# Patient Record
Sex: Female | Born: 1990 | Race: Black or African American | Hispanic: No | Marital: Single | Smoking: Former smoker
Health system: Southern US, Community
[De-identification: ages and names within clinical notes are randomized; demographics above are authoritative.]

## PROBLEM LIST (undated history)

## (undated) ENCOUNTER — Emergency Department (HOSPITAL_COMMUNITY): Admission: EM | Discharge status: Absent without leave | Payer: Self-pay

---
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
## 2010-02-01 ENCOUNTER — Emergency Department (HOSPITAL_COMMUNITY): Admission: EM | Admit: 2010-02-01 | Discharge: 2010-02-01 | Payer: Self-pay | Admitting: Emergency Medicine

## 2010-02-02 ENCOUNTER — Emergency Department (HOSPITAL_COMMUNITY): Admission: EM | Admit: 2010-02-02 | Discharge: 2010-02-02 | Payer: Self-pay | Admitting: Emergency Medicine

## 2010-06-08 ENCOUNTER — Encounter: Payer: Self-pay | Admitting: Unknown Physician Specialty

## 2010-07-31 LAB — POCT I-STAT, CHEM 8
BUN: 7 mg/dL (ref 6–23)
Calcium, Ion: 1.12 mmol/L (ref 1.12–1.32)
Chloride: 104 mEq/L (ref 96–112)
Creatinine, Ser: 0.8 mg/dL (ref 0.4–1.2)
Glucose, Bld: 89 mg/dL (ref 70–99)
TCO2: 25 mmol/L (ref 0–100)

## 2010-11-11 ENCOUNTER — Emergency Department (HOSPITAL_COMMUNITY): Payer: Self-pay

## 2010-11-11 ENCOUNTER — Emergency Department (HOSPITAL_COMMUNITY)
Admission: EM | Admit: 2010-11-11 | Discharge: 2010-11-11 | Disposition: A | Discharge status: Home / Self Care | Payer: Self-pay | Attending: Emergency Medicine | Admitting: Emergency Medicine

## 2010-11-11 DIAGNOSIS — S93409A Sprain of unspecified ligament of unspecified ankle, initial encounter: Secondary | ICD-10-CM | POA: Insufficient documentation

## 2010-11-11 DIAGNOSIS — X500XXA Overexertion from strenuous movement or load, initial encounter: Secondary | ICD-10-CM | POA: Insufficient documentation

## 2010-11-11 DIAGNOSIS — Y92009 Unspecified place in unspecified non-institutional (private) residence as the place of occurrence of the external cause: Secondary | ICD-10-CM | POA: Insufficient documentation

## 2010-12-26 ENCOUNTER — Emergency Department (HOSPITAL_COMMUNITY)
Admission: EM | Admit: 2010-12-26 | Discharge: 2010-12-26 | Disposition: A | Discharge status: Home / Self Care | Payer: Self-pay | Attending: Emergency Medicine | Admitting: Emergency Medicine

## 2010-12-26 ENCOUNTER — Emergency Department (HOSPITAL_COMMUNITY): Payer: Self-pay

## 2010-12-26 DIAGNOSIS — R059 Cough, unspecified: Secondary | ICD-10-CM | POA: Insufficient documentation

## 2010-12-26 DIAGNOSIS — R0602 Shortness of breath: Secondary | ICD-10-CM | POA: Insufficient documentation

## 2010-12-26 DIAGNOSIS — R0789 Other chest pain: Secondary | ICD-10-CM | POA: Insufficient documentation

## 2010-12-26 DIAGNOSIS — R062 Wheezing: Secondary | ICD-10-CM | POA: Insufficient documentation

## 2010-12-26 DIAGNOSIS — R072 Precordial pain: Secondary | ICD-10-CM | POA: Insufficient documentation

## 2010-12-26 DIAGNOSIS — F17200 Nicotine dependence, unspecified, uncomplicated: Secondary | ICD-10-CM | POA: Insufficient documentation

## 2010-12-26 DIAGNOSIS — R05 Cough: Secondary | ICD-10-CM | POA: Insufficient documentation

## 2010-12-26 LAB — APTT: aPTT: 35 seconds (ref 24–37)

## 2010-12-26 LAB — URINALYSIS, ROUTINE W REFLEX MICROSCOPIC
Bilirubin Urine: NEGATIVE
Glucose, UA: NEGATIVE mg/dL
Hgb urine dipstick: NEGATIVE
Protein, ur: NEGATIVE mg/dL

## 2010-12-26 LAB — DIFFERENTIAL
Basophils Relative: 0 % (ref 0–1)
Lymphocytes Relative: 23 % (ref 12–46)
Lymphs Abs: 3.3 10*3/uL (ref 0.7–4.0)
Monocytes Absolute: 0.9 10*3/uL (ref 0.1–1.0)
Monocytes Relative: 6 % (ref 3–12)
Neutro Abs: 9.7 10*3/uL — ABNORMAL HIGH (ref 1.7–7.7)
Neutrophils Relative %: 69 % (ref 43–77)

## 2010-12-26 LAB — BASIC METABOLIC PANEL
BUN: 14 mg/dL (ref 6–23)
CO2: 25 mEq/L (ref 19–32)
Calcium: 9.8 mg/dL (ref 8.4–10.5)
GFR calc non Af Amer: >60 mL/min (ref >60)
Glucose, Bld: 87 mg/dL (ref 70–99)
Sodium: 138 mEq/L (ref 135–145)

## 2010-12-26 LAB — CBC
HCT: 40.2 % (ref 36.0–46.0)
Hemoglobin: 13.5 g/dL (ref 12.0–15.0)
MCH: 32.1 pg (ref 26.0–34.0)
MCHC: 33.6 g/dL (ref 30.0–36.0)
MCV: 95.5 fL (ref 78.0–100.0)
RBC: 4.21 MIL/uL (ref 3.87–5.11)

## 2010-12-26 LAB — URINE MICROSCOPIC-ADD ON

## 2010-12-26 LAB — PROTIME-INR: Prothrombin Time: 13.6 seconds (ref 11.6–15.2)

## 2010-12-26 LAB — MAGNESIUM: Magnesium: 2 mg/dL (ref 1.5–2.5)

## 2010-12-26 MED ORDER — IOHEXOL 300 MG/ML  SOLN
100.0000 mL | Freq: Once | INTRAMUSCULAR | Status: AC | PRN
  Administered 2010-12-26: 100 mL via INTRAVENOUS

## 2010-12-28 LAB — URINE CULTURE: Colony Count: 30000

## 2011-01-19 ENCOUNTER — Emergency Department (HOSPITAL_COMMUNITY)
Admission: EM | Admit: 2011-01-19 | Discharge: 2011-01-19 | Disposition: A | Discharge status: Home / Self Care | Payer: Self-pay | Attending: Emergency Medicine | Admitting: Emergency Medicine

## 2011-01-19 DIAGNOSIS — J45909 Unspecified asthma, uncomplicated: Secondary | ICD-10-CM | POA: Insufficient documentation

## 2011-01-19 DIAGNOSIS — T63461A Toxic effect of venom of wasps, accidental (unintentional), initial encounter: Secondary | ICD-10-CM | POA: Insufficient documentation

## 2011-01-19 DIAGNOSIS — T63481A Toxic effect of venom of other arthropod, accidental (unintentional), initial encounter: Secondary | ICD-10-CM | POA: Insufficient documentation

## 2011-01-19 DIAGNOSIS — F39 Unspecified mood [affective] disorder: Secondary | ICD-10-CM | POA: Insufficient documentation

## 2011-01-19 DIAGNOSIS — Z9109 Other allergy status, other than to drugs and biological substances: Secondary | ICD-10-CM | POA: Insufficient documentation

## 2011-01-19 DIAGNOSIS — T6391XA Toxic effect of contact with unspecified venomous animal, accidental (unintentional), initial encounter: Secondary | ICD-10-CM | POA: Insufficient documentation

## 2011-01-19 DIAGNOSIS — R21 Rash and other nonspecific skin eruption: Secondary | ICD-10-CM | POA: Insufficient documentation

## 2011-01-20 ENCOUNTER — Emergency Department (HOSPITAL_COMMUNITY)
Admission: EM | Admit: 2011-01-20 | Discharge: 2011-01-20 | Disposition: A | Discharge status: Home / Self Care | Payer: No Typology Code available for payment source | Attending: Emergency Medicine | Admitting: Emergency Medicine

## 2011-01-20 ENCOUNTER — Emergency Department (HOSPITAL_COMMUNITY): Payer: No Typology Code available for payment source

## 2011-01-20 DIAGNOSIS — T07XXXA Unspecified multiple injuries, initial encounter: Secondary | ICD-10-CM | POA: Insufficient documentation

## 2011-01-20 DIAGNOSIS — Y92410 Unspecified street and highway as the place of occurrence of the external cause: Secondary | ICD-10-CM | POA: Insufficient documentation

## 2011-01-20 DIAGNOSIS — S139XXA Sprain of joints and ligaments of unspecified parts of neck, initial encounter: Secondary | ICD-10-CM | POA: Insufficient documentation

## 2011-01-25 ENCOUNTER — Inpatient Hospital Stay (INDEPENDENT_AMBULATORY_CARE_PROVIDER_SITE_OTHER)
Admission: RE | Admit: 2011-01-25 | Discharge: 2011-01-25 | Disposition: A | Discharge status: Home / Self Care | Payer: Self-pay | Source: Ambulatory Visit | Attending: Family Medicine | Admitting: Family Medicine

## 2011-01-25 DIAGNOSIS — M799 Soft tissue disorder, unspecified: Secondary | ICD-10-CM

## 2011-01-25 DIAGNOSIS — S139XXA Sprain of joints and ligaments of unspecified parts of neck, initial encounter: Secondary | ICD-10-CM

## 2011-01-25 DIAGNOSIS — S40019A Contusion of unspecified shoulder, initial encounter: Secondary | ICD-10-CM

## 2011-06-22 ENCOUNTER — Emergency Department (HOSPITAL_COMMUNITY)
Admission: EM | Admit: 2011-06-22 | Discharge: 2011-06-23 | Disposition: A | Discharge status: Home / Self Care | Payer: Medicaid Other | Attending: Emergency Medicine | Admitting: Emergency Medicine

## 2011-06-22 ENCOUNTER — Encounter (HOSPITAL_COMMUNITY): Payer: Self-pay | Admitting: *Deleted

## 2011-06-22 ENCOUNTER — Emergency Department (HOSPITAL_COMMUNITY): Payer: Medicaid Other

## 2011-06-22 DIAGNOSIS — J45909 Unspecified asthma, uncomplicated: Secondary | ICD-10-CM | POA: Insufficient documentation

## 2011-06-22 DIAGNOSIS — S6390XA Sprain of unspecified part of unspecified wrist and hand, initial encounter: Secondary | ICD-10-CM | POA: Insufficient documentation

## 2011-06-22 DIAGNOSIS — M79609 Pain in unspecified limb: Secondary | ICD-10-CM | POA: Insufficient documentation

## 2011-06-22 DIAGNOSIS — F17200 Nicotine dependence, unspecified, uncomplicated: Secondary | ICD-10-CM | POA: Insufficient documentation

## 2011-06-22 DIAGNOSIS — Y990 Civilian activity done for income or pay: Secondary | ICD-10-CM | POA: Insufficient documentation

## 2011-06-22 DIAGNOSIS — S6391XA Sprain of unspecified part of right wrist and hand, initial encounter: Secondary | ICD-10-CM

## 2011-06-22 DIAGNOSIS — M7989 Other specified soft tissue disorders: Secondary | ICD-10-CM | POA: Insufficient documentation

## 2011-06-22 DIAGNOSIS — W241XXA Contact with transmission devices, not elsewhere classified, initial encounter: Secondary | ICD-10-CM | POA: Insufficient documentation

## 2011-06-22 NOTE — ED Notes (Signed)
The pt works for [employer] and  She caught her rt hand in a conveyor belt just pta tonight.  She has pain in  Her rt ring and little fingers.

## 2011-06-22 NOTE — ED Notes (Signed)
Pt states hand caught in machine at work

## 2011-06-23 MED ORDER — ACETAMINOPHEN 325 MG PO TABS
ORAL_TABLET | ORAL | Status: AC
  Filled 2011-06-23: qty 3

## 2011-06-23 MED ORDER — ACETAMINOPHEN 500 MG PO TABS
1000.0000 mg | ORAL_TABLET | ORAL | Status: AC
  Administered 2011-06-23: 1000 mg via ORAL
  Filled 2011-06-23: qty 2

## 2011-06-23 NOTE — ED Provider Notes (Signed)
History     CSN: [id number]  Arrival date & time 06/22/11  2121   First MD Initiated Contact with Patient 06/23/11 0019      Chief Complaint  Patient presents with  . Hand Injury    (Consider location/radiation/quality/duration/timing/severity/associated sxs/prior treatment) HPI Reports her right hand got caught in a conveyor belt tonight 6:20 PM while at work complains of pain at ulnar aspect of hand from fingers or wrist worse with movement treated with ibuprofen with partial relief no numbness. No associated symptoms. Pain is nonradiating not improved by anything . Moderate at [employer] Past Medical History  Diagnosis Date  . Asthma     History reviewed. No pertinent past surgical history.  History reviewed. No pertinent family history.  History  Substance Use Topics  . Smoking status: Current Everyday Smoker  . Smokeless tobacco: Not on file  . Alcohol Use: No    OB History    Grav Para Term Preterm Abortions TAB SAB Ect Mult Living                  Review of Systems  Constitutional: Negative.   Musculoskeletal:       Trauma right hand  Skin: Negative.   Neurological: Negative.   Hematological: Negative.   Psychiatric/Behavioral: Negative.     Allergies  Review of patient's allergies indicates no known allergies.  Home Medications  No current outpatient prescriptions on file.  BP 138/93  Pulse 91  Temp(Src) 97.9 F (36.6 C) (Oral)  Resp 16  SpO2 96%  Physical Exam  Constitutional: She appears well-nourished. No distress.  HENT:  Head: Normocephalic and atraumatic.  Eyes: EOM are normal.  Neck: Neck supple.  Cardiovascular: Normal rate.   Pulmonary/Chest: Effort normal.  Abdominal:       Obese  Musculoskeletal:       Right upper extremity hand mildly diffusely tender with minimal soft tissue swelling no ecchymosis. Limited range of motion motion of fingers 234 and 5 due to pain of fingers with good capillary refill sensation intact no subungual  hematoma/. Wrist nontender forearm nontender radial pulse 2+; all other extremities atraumatic neurovascular intact    ED Course  Procedures (including critical care time) Velcro splint applied by orthostatic tech, comfortable for patient Labs Reviewed - No data to display Dg Hand Complete Right  06/22/2011  *RADIOLOGY REPORT*  Clinical Data: Right hand pain.  RIGHT HAND - COMPLETE 3+ VIEW  Comparison: 01/20/2011 wrist radiograph  Findings: No acute fracture or dislocation.  No aggressive osseous lesions.  IMPRESSION: No acute osseous abnormality identified.  Original Report Authenticated By: Waneta Martins, M.D.     No diagnosis found.    MDM  Patient declines prescription for pain medicine stating that she will take Tylenol Plan followup with workers comp clinic tomorrow to determine work limitations Note this is not a roller-type crush injury   Diagnosis sprain right hand          Doug Sou, MD 06/23/11 0210

## 2011-06-23 NOTE — ED Notes (Signed)
[facility] contacted for R wrist lateral splint

## 2011-06-23 NOTE — ED Notes (Signed)
Pt still in dept for urine sample for workmans comp.

## 2011-06-23 NOTE — ED Notes (Addendum)
Pt reports she caught her  Right hand between covaery belt and roller table no broken skin or deformity noted but +1 edema to back of hand

## 2011-06-30 ENCOUNTER — Telehealth (HOSPITAL_COMMUNITY): Payer: Self-pay | Admitting: *Deleted

## 2011-06-30 NOTE — ED Notes (Signed)
[address] [employer] called to verify pt. was seen here 2/4. I told her we saw the pt. but then transferred her to the ED. Referred her to the flow manager @ [phone]. Vassie Moselle 06/30/2011

## 2012-08-14 ENCOUNTER — Encounter (HOSPITAL_COMMUNITY): Payer: Self-pay

## 2012-08-14 ENCOUNTER — Emergency Department (HOSPITAL_COMMUNITY)
Admission: EM | Admit: 2012-08-14 | Discharge: 2012-08-14 | Disposition: A | Discharge status: Home / Self Care | Payer: Self-pay | Attending: Emergency Medicine | Admitting: Emergency Medicine

## 2012-08-14 ENCOUNTER — Emergency Department (HOSPITAL_COMMUNITY): Payer: Self-pay

## 2012-08-14 DIAGNOSIS — R110 Nausea: Secondary | ICD-10-CM | POA: Insufficient documentation

## 2012-08-14 DIAGNOSIS — K219 Gastro-esophageal reflux disease without esophagitis: Secondary | ICD-10-CM | POA: Insufficient documentation

## 2012-08-14 DIAGNOSIS — R059 Cough, unspecified: Secondary | ICD-10-CM | POA: Insufficient documentation

## 2012-08-14 DIAGNOSIS — R300 Dysuria: Secondary | ICD-10-CM | POA: Insufficient documentation

## 2012-08-14 DIAGNOSIS — R05 Cough: Secondary | ICD-10-CM | POA: Insufficient documentation

## 2012-08-14 DIAGNOSIS — J45909 Unspecified asthma, uncomplicated: Secondary | ICD-10-CM | POA: Insufficient documentation

## 2012-08-14 DIAGNOSIS — R071 Chest pain on breathing: Secondary | ICD-10-CM | POA: Insufficient documentation

## 2012-08-14 DIAGNOSIS — Z87891 Personal history of nicotine dependence: Secondary | ICD-10-CM | POA: Insufficient documentation

## 2012-08-14 LAB — CBC WITH DIFFERENTIAL/PLATELET
Basophils Absolute: 0 10*3/uL (ref 0.0–0.1)
Basophils Relative: 0 % (ref 0–1)
Eosinophils Absolute: 0.1 10*3/uL (ref 0.0–0.7)
Eosinophils Relative: 2 % (ref 0–5)
MCH: 32.7 pg (ref 26.0–34.0)
MCV: 96.7 fL (ref 78.0–100.0)
Neutrophils Relative %: 60 % (ref 43–77)
Platelets: 263 10*3/uL (ref 150–400)
RDW: 12.2 % (ref 11.5–15.5)

## 2012-08-14 LAB — BASIC METABOLIC PANEL
Calcium: 9.2 mg/dL (ref 8.4–10.5)
GFR calc Af Amer: >90 mL/min (ref >90)
GFR calc non Af Amer: >90 mL/min (ref >90)
Sodium: 137 mEq/L (ref 135–145)

## 2012-08-14 LAB — LIPASE, BLOOD: Lipase: 23 U/L (ref 11–59)

## 2012-08-14 MED ORDER — GI COCKTAIL ~~LOC~~
30.0000 mL | Freq: Once | ORAL | Status: AC
  Administered 2012-08-14: 30 mL via ORAL
  Filled 2012-08-14: qty 30

## 2012-08-14 MED ORDER — OMEPRAZOLE 20 MG PO CPDR: 20.0000 mg | DELAYED_RELEASE_CAPSULE | Freq: Every day | ORAL | Status: DC

## 2012-08-14 NOTE — ED Notes (Signed)
C/o LUQ pain x 6 days.  C/o nausea but no v/d.  Consistent ache w/occasional shooting pain up into LT arm, up into throat and into head.

## 2012-08-14 NOTE — ED Provider Notes (Signed)
History     CSN: [id number]  Arrival date & time 08/14/12  1250   First MD Initiated Contact with Patient 08/14/12 1340      Chief Complaint  Patient presents with  . LUQ pain     (Consider location/radiation/quality/duration/timing/severity/associated sxs/prior treatment) HPI Comments: This is a 22 year old female, past medical history remarkable for asthma, who presents emergency department with chief complaint of upper left abdominal pain. Patient states that the pain is worsened with eating, drinking, or laying on the left side, and deep breathing. She also endorses a nonproductive cough. She denies fever, chills, and shortness of breath. She endorses nausea, but no vomiting or diarrhea. She endorses some dysuria. She states that her pain is intermittent and moderate in severity. She states that it was sometimes radiate to her left arm, throat, and head.  The history is provided by the patient. No language interpreter was used.    Past Medical History  Diagnosis Date  . Asthma     No past surgical history on file.  No family history on file.  History  Substance Use Topics  . Smoking status: Former Games developer  . Smokeless tobacco: Not on file  . Alcohol Use: Yes     Comment: occasionally    OB History   Grav Para Term Preterm Abortions TAB SAB Ect Mult Living                  Review of Systems  All other systems reviewed and are negative.    Allergies  Review of patient's allergies indicates no known allergies.  Home Medications   Current Outpatient Rx  Name  Route  Sig  Dispense  Refill  . ibuprofen (ADVIL,MOTRIN) 200 MG tablet   Oral   Take 200 mg by mouth every 6 (six) hours as needed for pain.           BP 130/80  Pulse 88  Temp(Src) 98.3 F (36.8 C) (Oral)  Resp 16  Ht 5\' 2"  (1.575 m)  Wt 187 lb (84.823 kg)  BMI 34.19 kg/m2  SpO2 100%  Physical Exam  Nursing note and vitals reviewed. Constitutional: She is oriented to person, place, and  time. She appears well-developed and well-nourished.  HENT:  Head: Normocephalic and atraumatic.  Eyes: Conjunctivae and EOM are normal. Pupils are equal, round, and reactive to light.  Neck: Normal range of motion. Neck supple.  Cardiovascular: Normal rate and regular rhythm.  Exam reveals no gallop and no friction rub.   No murmur heard. Pulmonary/Chest: Effort normal and breath sounds normal. No respiratory distress. She has no wheezes. She has no rales. She exhibits no tenderness.  Left lower ribs tender to palpation  Abdominal: Soft. Bowel sounds are normal. She exhibits no distension and no mass. There is no tenderness. There is no rebound and no guarding.  No focal abdominal tenderness, no right lower quadrant tenderness or pain at McBurney's point, no right upper quadrant tenderness or Murphy's sign, no left lower quadrant tenderness or fluid wave, or signs of peritonitis  Musculoskeletal: Normal range of motion. She exhibits no edema and no tenderness.  Neurological: She is alert and oriented to person, place, and time.  Skin: Skin is warm and dry.  Psychiatric: She has a normal mood and affect. Her behavior is normal. Judgment and thought content normal.    ED Course  Procedures (including critical care time)  Results for orders placed during the hospital encounter of 08/14/12  CBC WITH DIFFERENTIAL  Result Value Range   WBC 7.1  4.0 - 10.5 K/uL   RBC 3.91  3.87 - 5.11 MIL/uL   Hemoglobin 12.8  12.0 - 15.0 g/dL   HCT 04.5  40.9 - 81.1 %   MCV 96.7  78.0 - 100.0 fL   MCH 32.7  26.0 - 34.0 pg   MCHC 33.9  30.0 - 36.0 g/dL   RDW 91.4  78.2 - 95.6 %   Platelets 263  150 - 400 K/uL   Neutrophils Relative 60  43 - 77 %   Neutro Abs 4.3  1.7 - 7.7 K/uL   Lymphocytes Relative 29  12 - 46 %   Lymphs Abs 2.0  0.7 - 4.0 K/uL   Monocytes Relative 9  3 - 12 %   Monocytes Absolute 0.6  0.1 - 1.0 K/uL   Eosinophils Relative 2  0 - 5 %   Eosinophils Absolute 0.1  0.0 - 0.7 K/uL    Basophils Relative 0  0 - 1 %   Basophils Absolute 0.0  0.0 - 0.1 K/uL  BASIC METABOLIC PANEL      Result Value Range   Sodium 137  135 - 145 mEq/L   Potassium 4.0  3.5 - 5.1 mEq/L   Chloride 102  96 - 112 mEq/L   CO2 25  19 - 32 mEq/L   Glucose, Bld 92  70 - 99 mg/dL   BUN 13  6 - 23 mg/dL   Creatinine, Ser 2.13  0.50 - 1.10 mg/dL   Calcium 9.2  8.4 - 08.6 mg/dL   GFR calc non Af Amer >90  >90 mL/min   GFR calc Af Amer >90  >90 mL/min  LIPASE, BLOOD      Result Value Range   Lipase 23  11 - 59 U/L   Dg Ribs Unilateral W/chest Left  08/14/2012  *RADIOLOGY REPORT*  Clinical Data: Cough.  Anterior rib pain.  LEFT RIBS AND CHEST - 3+ VIEW  Comparison: CT chest 12/26/2010.  Findings: The heart size is normal.  The lungs are clear.  Dedicated imaging of the left ribs demonstrates no evidence for acute or healing fracture.  There is no pneumothorax.  IMPRESSION:  1.  No acute cardiopulmonary disease. 2.  Normal radiographs of the left ribs.   Original Report Authenticated By: Marin Roberts, M.D.       1. GERD (gastroesophageal reflux disease)       MDM  22 year old female with cough, nausea, and left upper quadrant tenderness.    Patient feels improved after GI cocktail.  Other labs and imaging today are reassuring.  Will treat with Prilosec.  PCP follow-up.  Discussed the patient with Dr. Lynelle Doctor, who agrees with the plan.        Roxy Horseman, PA-C 08/14/12 1953

## 2012-08-15 NOTE — ED Provider Notes (Signed)
Medical screening examination/treatment/procedure(s) were performed by non-physician practitioner and as supervising physician I was immediately available for consultation/collaboration. Stamatia Masri, MD, FACEP   Marlayna Bannister L Olympia Adelsberger, MD 08/15/12 0658 

## 2012-10-10 ENCOUNTER — Emergency Department (HOSPITAL_COMMUNITY)
Admission: EM | Admit: 2012-10-10 | Discharge: 2012-10-11 | Disposition: A | Discharge status: Home / Self Care | Payer: Self-pay | Attending: Emergency Medicine | Admitting: Emergency Medicine

## 2012-10-10 ENCOUNTER — Encounter (HOSPITAL_COMMUNITY): Payer: Self-pay | Admitting: *Deleted

## 2012-10-10 DIAGNOSIS — R630 Anorexia: Secondary | ICD-10-CM | POA: Insufficient documentation

## 2012-10-10 DIAGNOSIS — J45901 Unspecified asthma with (acute) exacerbation: Secondary | ICD-10-CM | POA: Insufficient documentation

## 2012-10-10 DIAGNOSIS — K089 Disorder of teeth and supporting structures, unspecified: Secondary | ICD-10-CM | POA: Insufficient documentation

## 2012-10-10 DIAGNOSIS — Z3202 Encounter for pregnancy test, result negative: Secondary | ICD-10-CM | POA: Insufficient documentation

## 2012-10-10 DIAGNOSIS — K0889 Other specified disorders of teeth and supporting structures: Secondary | ICD-10-CM

## 2012-10-10 DIAGNOSIS — Z87891 Personal history of nicotine dependence: Secondary | ICD-10-CM | POA: Insufficient documentation

## 2012-10-10 DIAGNOSIS — R55 Syncope and collapse: Secondary | ICD-10-CM | POA: Insufficient documentation

## 2012-10-10 DIAGNOSIS — Z79899 Other long term (current) drug therapy: Secondary | ICD-10-CM | POA: Insufficient documentation

## 2012-10-10 DIAGNOSIS — K029 Dental caries, unspecified: Secondary | ICD-10-CM | POA: Insufficient documentation

## 2012-10-10 LAB — CBC WITH DIFFERENTIAL/PLATELET
Basophils Absolute: 0.1 10*3/uL (ref 0.0–0.1)
Basophils Relative: 0 % (ref 0–1)
HCT: 36.5 % (ref 36.0–46.0)
MCHC: 33.4 g/dL (ref 30.0–36.0)
Monocytes Absolute: 0.8 10*3/uL (ref 0.1–1.0)
Neutro Abs: 9 10*3/uL — ABNORMAL HIGH (ref 1.7–7.7)
Platelets: 247 10*3/uL (ref 150–400)
RDW: 12.8 % (ref 11.5–15.5)

## 2012-10-10 LAB — POCT I-STAT, CHEM 8
Calcium, Ion: 1.14 mmol/L (ref 1.12–1.23)
Chloride: 108 mEq/L (ref 96–112)
HCT: 38 % (ref 36.0–46.0)
TCO2: 25 mmol/L (ref 0–100)

## 2012-10-10 LAB — POCT PREGNANCY, URINE: Preg Test, Ur: NEGATIVE

## 2012-10-10 MED ORDER — ALBUTEROL SULFATE HFA 108 (90 BASE) MCG/ACT IN AERS
2.0000 | INHALATION_SPRAY | RESPIRATORY_TRACT | Status: DC | PRN
  Administered 2012-10-10: 2 via RESPIRATORY_TRACT
  Filled 2012-10-10: qty 6.7

## 2012-10-10 NOTE — ED Provider Notes (Signed)
History     CSN: [id number]  Arrival date & time 10/10/12  2057   First MD Initiated Contact with Patient 10/10/12 2146      Chief Complaint  Patient presents with  . Loss of Consciousness   HPI  History provided by the patient. Patient is a healthy 22 year old female past history of asthma who presents with complaints of worsened bilateral lower dental pain and syncopal episodes. Patient states she has had increasing pain to her lower molar teeth the past few days. Pain has caused her to have decreased appetite and fluid intake. She denies having any swelling of the mouth. Denies any fever, chills or sweats. Today while at work she was feeling lightheaded and while having increased sharp pains were teeth reports having a brief syncopal episode. She states she was caught by her coworkers and sat into a chair. She was still feeling lightheaded and when she stood up.  Pt reports that she had additional episodes of syncope earlier in the week as well. She denied having any chest pain or shortness of breath. No heart palpitations. There was no urinary or fecal incontinence. No post ictal state. She denies any injuries or pain. Patient has not traveled anywhere recently. Denies cough or hemoptysis. She has not used birth control and estrogen.     Past Medical History  Diagnosis Date  . Asthma     No past surgical history on file.  No family history on file.  History  Substance Use Topics  . Smoking status: Former Games developer  . Smokeless tobacco: Not on file  . Alcohol Use: Yes     Comment: occasionally    OB History   Grav Para Term Preterm Abortions TAB SAB Ect Mult Living                  Review of Systems  Constitutional: Negative for fever, chills and diaphoresis.  HENT: Positive for dental problem. Negative for drooling, mouth sores and trouble swallowing.   Respiratory: Positive for wheezing. Negative for shortness of breath.   Cardiovascular: Negative for chest pain and  palpitations.  Gastrointestinal: Negative for nausea and vomiting.  Neurological: Positive for syncope.  All other systems reviewed and are negative.    Allergies  Oxycodone  Home Medications   Current Outpatient Rx  Name  Route  Sig  Dispense  Refill  . albuterol (PROVENTIL HFA;VENTOLIN HFA) 108 (90 BASE) MCG/ACT inhaler   Inhalation   Inhale 2 puffs into the lungs every 6 (six) hours as needed for wheezing or shortness of breath.         Marland Kitchen ibuprofen (ADVIL,MOTRIN) 200 MG tablet   Oral   Take 800 mg by mouth every 6 (six) hours as needed for pain.          Marland Kitchen omeprazole (PRILOSEC) 20 MG capsule   Oral   Take 1 capsule (20 mg total) by mouth daily.   30 capsule   0     BP 131/81  Pulse 99  Temp(Src) 98.3 F (36.8 C) (Oral)  Resp 18  Ht 5\' 3"  (1.6 m)  Wt 198 lb (89.812 kg)  BMI 35.08 kg/m2  SpO2 100%  Physical Exam  Nursing note and vitals reviewed. Constitutional: She is oriented to person, place, and time. She appears well-developed and well-nourished. No distress.  HENT:  Head: Normocephalic.  Mouth/Throat: Oropharynx is clear and moist and mucous membranes are normal.    There are fractures indicate to the bilateral lower first  molar teeth. Tenderness to percussion over both teeth. Surrounding gums appear normal without significant edema and no signs of drainable abscess. No swelling under the tongue.  Neck: Normal range of motion. Neck supple.  Cardiovascular: Normal rate and regular rhythm.   No murmur heard. Pulmonary/Chest: Effort normal. No stridor. No respiratory distress. She has wheezes. She has no rales. She exhibits no tenderness.  Very slight expiratory wheeze.  Abdominal: Soft. There is no tenderness. There is no rebound and no guarding.  Musculoskeletal: Normal range of motion. She exhibits no edema and no tenderness.  No clinical signs concerning for DVT  Lymphadenopathy:    She has no cervical adenopathy.  Neurological: She is alert and  oriented to person, place, and time.  Skin: Skin is warm and dry. No rash noted.  Psychiatric: She has a normal mood and affect. Her behavior is normal.    ED Course  Procedures   Results for orders placed during the hospital encounter of 10/10/12  URINALYSIS, ROUTINE W REFLEX MICROSCOPIC      Result Value Range   Color, Urine YELLOW  YELLOW   APPearance CLOUDY (*) CLEAR   Specific Gravity, Urine 1.030  1.005 - 1.030   pH 5.5  5.0 - 8.0   Glucose, UA NEGATIVE  NEGATIVE mg/dL   Hgb urine dipstick NEGATIVE  NEGATIVE   Bilirubin Urine NEGATIVE  NEGATIVE   Ketones, ur NEGATIVE  NEGATIVE mg/dL   Protein, ur NEGATIVE  NEGATIVE mg/dL   Urobilinogen, UA 0.2  0.0 - 1.0 mg/dL   Nitrite NEGATIVE  NEGATIVE   Leukocytes, UA SMALL (*) NEGATIVE  CBC WITH DIFFERENTIAL      Result Value Range   WBC 14.1 (*) 4.0 - 10.5 K/uL   RBC 3.76 (*) 3.87 - 5.11 MIL/uL   Hemoglobin 12.2  12.0 - 15.0 g/dL   HCT 16.1  09.6 - 04.5 %   MCV 97.1  78.0 - 100.0 fL   MCH 32.4  26.0 - 34.0 pg   MCHC 33.4  30.0 - 36.0 g/dL   RDW 40.9  81.1 - 91.4 %   Platelets 247  150 - 400 K/uL   Neutrophils Relative % 64  43 - 77 %   Neutro Abs 9.0 (*) 1.7 - 7.7 K/uL   Lymphocytes Relative 26  12 - 46 %   Lymphs Abs 3.7  0.7 - 4.0 K/uL   Monocytes Relative 6  3 - 12 %   Monocytes Absolute 0.8  0.1 - 1.0 K/uL   Eosinophils Relative 4  0 - 5 %   Eosinophils Absolute 0.5  0.0 - 0.7 K/uL   Basophils Relative 0  0 - 1 %   Basophils Absolute 0.1  0.0 - 0.1 K/uL  URINE MICROSCOPIC-ADD ON      Result Value Range   Squamous Epithelial / LPF FEW (*) RARE   WBC, UA 3-6  <3 WBC/hpf   Bacteria, UA RARE  RARE  POCT I-STAT, CHEM 8      Result Value Range   Sodium 142  135 - 145 mEq/L   Potassium 3.8  3.5 - 5.1 mEq/L   Chloride 108  96 - 112 mEq/L   BUN 14  6 - 23 mg/dL   Creatinine, Ser 7.82  0.50 - 1.10 mg/dL   Glucose, Bld 84  70 - 99 mg/dL   Calcium, Ion 9.56  2.13 - 1.23 mmol/L   TCO2 25  0 - 100 mmol/L   Hemoglobin 12.9  12.0 - 15.0 g/dL   HCT 30.8  65.7 - 84.6 %  POCT PREGNANCY, URINE      Result Value Range   Preg Test, Ur NEGATIVE  NEGATIVE        1. Pain, dental   2. Dental caries   3. Syncope       MDM  10:10PM patient seen and evaluated. Patient appears well in no acute distress. Patient is PERC negative. Normal heart rate with no murmur. Normal respirations and O2 sats. Slight wheeze with history of asthma. Albuterol inhaler ordered.  Pt is PERC negative.  Labs unremarkable. Patient continues to appear well. This discussed plan for her to followup with the dentist for dental pains. We'll give prescription for antibiotics for the infection. Patient also instructed to follow up with the primary care provider. We discussed that she may need further evaluation and possible Holter monitor. She is advised to return if symptoms continue or worsen.        Date: 10/10/2012  Rate: 85  Rhythm: normal sinus rhythm  QRS Axis: normal  Intervals: normal  ST/T Wave abnormalities: normal  Conduction Disutrbances:none  Narrative Interpretation:   Old EKG Reviewed: No change from 12/26/2010          Angus Seller, PA-C 10/11/12 0345

## 2012-10-10 NOTE — ED Notes (Signed)
Pt noted no problems with orthostatic vital signs

## 2012-10-10 NOTE — ED Notes (Signed)
Pt states that she has had 3 syncopal episodes in the last week ; pt states that the first time "I went totally out and I didn't know it"; pt states that the other 2 episodes she was awake but everything was black and she couldn't see and felt like she couldn't breathe; pt states that she is also having dental pain and has been having trouble with her asthma

## 2012-10-11 LAB — URINALYSIS, ROUTINE W REFLEX MICROSCOPIC
Glucose, UA: NEGATIVE mg/dL
Ketones, ur: NEGATIVE mg/dL
Nitrite: NEGATIVE
Protein, ur: NEGATIVE mg/dL
Urobilinogen, UA: 0.2 mg/dL (ref 0.0–1.0)

## 2012-10-11 LAB — URINE MICROSCOPIC-ADD ON

## 2012-10-11 MED ORDER — IBUPROFEN 800 MG PO TABS: 800.0000 mg | ORAL_TABLET | Freq: Three times a day (TID) | ORAL | Status: DC | PRN

## 2012-10-11 MED ORDER — PENICILLIN V POTASSIUM 500 MG PO TABS: 500.0000 mg | ORAL_TABLET | Freq: Four times a day (QID) | ORAL | Status: AC

## 2012-10-11 NOTE — ED Notes (Signed)
Discharge instructions reviewed w/ pt., verbalizes understanding. Two prescriptions provided at discharge, importance of taking all of antibiotics reviewed.

## 2012-10-12 NOTE — ED Provider Notes (Signed)
Medical screening examination/treatment/procedure(s) were performed by non-physician practitioner and as supervising physician I was immediately available for consultation/collaboration.  Mayah Urquidi T Vir Whetstine, MD 10/12/12 2332 

## 2013-02-15 ENCOUNTER — Ambulatory Visit: Payer: Self-pay | Admitting: Cardiology

## 2013-04-05 ENCOUNTER — Ambulatory Visit: Payer: Self-pay

## 2013-05-04 ENCOUNTER — Emergency Department (HOSPITAL_COMMUNITY): Payer: No Typology Code available for payment source

## 2013-05-04 ENCOUNTER — Emergency Department (HOSPITAL_COMMUNITY)
Admission: EM | Admit: 2013-05-04 | Discharge: 2013-05-04 | Disposition: A | Discharge status: Home / Self Care | Payer: No Typology Code available for payment source | Attending: Emergency Medicine | Admitting: Emergency Medicine

## 2013-05-04 ENCOUNTER — Encounter (HOSPITAL_COMMUNITY): Payer: Self-pay | Admitting: Emergency Medicine

## 2013-05-04 DIAGNOSIS — R259 Unspecified abnormal involuntary movements: Secondary | ICD-10-CM | POA: Insufficient documentation

## 2013-05-04 DIAGNOSIS — S46909A Unspecified injury of unspecified muscle, fascia and tendon at shoulder and upper arm level, unspecified arm, initial encounter: Secondary | ICD-10-CM | POA: Insufficient documentation

## 2013-05-04 DIAGNOSIS — S0993XA Unspecified injury of face, initial encounter: Secondary | ICD-10-CM | POA: Insufficient documentation

## 2013-05-04 DIAGNOSIS — M549 Dorsalgia, unspecified: Secondary | ICD-10-CM

## 2013-05-04 DIAGNOSIS — Z87891 Personal history of nicotine dependence: Secondary | ICD-10-CM | POA: Insufficient documentation

## 2013-05-04 DIAGNOSIS — S8990XA Unspecified injury of unspecified lower leg, initial encounter: Secondary | ICD-10-CM | POA: Insufficient documentation

## 2013-05-04 DIAGNOSIS — S79919A Unspecified injury of unspecified hip, initial encounter: Secondary | ICD-10-CM | POA: Insufficient documentation

## 2013-05-04 DIAGNOSIS — M542 Cervicalgia: Secondary | ICD-10-CM

## 2013-05-04 DIAGNOSIS — J45909 Unspecified asthma, uncomplicated: Secondary | ICD-10-CM | POA: Insufficient documentation

## 2013-05-04 DIAGNOSIS — IMO0002 Reserved for concepts with insufficient information to code with codable children: Secondary | ICD-10-CM | POA: Insufficient documentation

## 2013-05-04 DIAGNOSIS — Y9389 Activity, other specified: Secondary | ICD-10-CM | POA: Insufficient documentation

## 2013-05-04 DIAGNOSIS — S4980XA Other specified injuries of shoulder and upper arm, unspecified arm, initial encounter: Secondary | ICD-10-CM | POA: Insufficient documentation

## 2013-05-04 DIAGNOSIS — Y92410 Unspecified street and highway as the place of occurrence of the external cause: Secondary | ICD-10-CM | POA: Insufficient documentation

## 2013-05-04 DIAGNOSIS — Z79899 Other long term (current) drug therapy: Secondary | ICD-10-CM | POA: Insufficient documentation

## 2013-05-04 DIAGNOSIS — S298XXA Other specified injuries of thorax, initial encounter: Secondary | ICD-10-CM | POA: Insufficient documentation

## 2013-05-04 MED ORDER — CYCLOBENZAPRINE HCL 10 MG PO TABS
10.0000 mg | ORAL_TABLET | Freq: Once | ORAL | Status: AC
  Administered 2013-05-04: 10 mg via ORAL
  Filled 2013-05-04: qty 1

## 2013-05-04 MED ORDER — CYCLOBENZAPRINE HCL 10 MG PO TABS: 10.0000 mg | ORAL_TABLET | Freq: Three times a day (TID) | ORAL | Status: DC | PRN

## 2013-05-04 MED ORDER — IBUPROFEN 800 MG PO TABS: 800.0000 mg | ORAL_TABLET | Freq: Three times a day (TID) | ORAL | Status: DC | PRN
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
## 2013-05-04 NOTE — ED Provider Notes (Signed)
Medical screening examination/treatment/procedure(s) were performed by non-physician practitioner and as supervising physician I was immediately available for consultation/collaboration.  [name], MD 05/04/13 [phone]
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
## 2013-12-04 ENCOUNTER — Ambulatory Visit: Payer: No Typology Code available for payment source | Attending: Internal Medicine | Admitting: Internal Medicine

## 2013-12-04 ENCOUNTER — Encounter: Payer: Self-pay | Admitting: Internal Medicine

## 2013-12-04 VITALS — BP 111/75 | HR 89 | Temp 98.0°F | Resp 22 | Ht 63.0 in | Wt 213.0 lb

## 2013-12-04 DIAGNOSIS — Z79899 Other long term (current) drug therapy: Secondary | ICD-10-CM | POA: Insufficient documentation

## 2013-12-04 DIAGNOSIS — K089 Disorder of teeth and supporting structures, unspecified: Secondary | ICD-10-CM | POA: Insufficient documentation

## 2013-12-04 DIAGNOSIS — Z7189 Other specified counseling: Secondary | ICD-10-CM

## 2013-12-04 DIAGNOSIS — Z87891 Personal history of nicotine dependence: Secondary | ICD-10-CM | POA: Insufficient documentation

## 2013-12-04 DIAGNOSIS — K0889 Other specified disorders of teeth and supporting structures: Secondary | ICD-10-CM

## 2013-12-04 DIAGNOSIS — Z7689 Persons encountering health services in other specified circumstances: Secondary | ICD-10-CM

## 2013-12-04 MED ORDER — IBUPROFEN 800 MG PO TABS
800.0000 mg | ORAL_TABLET | Freq: Three times a day (TID) | ORAL | Status: DC | PRN
Start: 2013-12-04 — End: 2014-03-26

## 2013-12-04 MED ORDER — AMOXICILLIN 500 MG PO CAPS: 500.0000 mg | ORAL_CAPSULE | Freq: Three times a day (TID) | ORAL | Status: DC

## 2013-12-04 NOTE — Patient Instructions (Signed)

## 2013-12-04 NOTE — Progress Notes (Signed)
Patient presents to establish care Requesting dental referral for 2 year history of teeth pain; rates 7/10 at present Referred to CSW for PHQ-9 score of 19

## 2013-12-04 NOTE — Progress Notes (Signed)
Patient ID: Paula Wiggins, female   DOB: September 12, 1990, 23 y.o.   MRN: [id number]  YQM:[id number]  [id number]:[id number]  DOB - 1991-01-31  CC:  Chief Complaint  Patient presents with  . Establish Care  . Dental Pain       HPI: Paula Wiggins is a 23 y.o. female here today to establish medical care.  Patient reports that she comes for a physical and for evaluation of dental pain.  Patient reports pain on both sides of her mouth in the back for the past two years.  Patient states that she has not been to a dentist and has only been taking ibuprofen for pain.  Patient denies all other complaints today.  Allergies  Allergen Reactions  . Oxycodone Swelling    Shortness of breath   Past Medical History  Diagnosis Date  . Asthma    Current Outpatient Prescriptions on File Prior to Visit  Medication Sig Dispense Refill  . albuterol (PROVENTIL HFA;VENTOLIN HFA) 108 (90 BASE) MCG/ACT inhaler Inhale 2 puffs into the lungs every 6 (six) hours as needed for wheezing or shortness of breath.      Marland Kitchen ibuprofen (ADVIL,MOTRIN) 200 MG tablet Take 400 mg by mouth every 6 (six) hours as needed (toothache).       . cyclobenzaprine (FLEXERIL) 10 MG tablet Take 1 tablet (10 mg total) by mouth 3 (three) times daily as needed for muscle spasms.  12 tablet  0  . ibuprofen (ADVIL,MOTRIN) 800 MG tablet Take 1 tablet (800 mg total) by mouth every 8 (eight) hours as needed.  21 tablet  0   No current facility-administered medications on file prior to visit.   Family History  Problem Relation Age of Onset  . Hypertension Mother   . Seizures Father   . Hypertension Father    History   Social History  . Marital Status: Single    Spouse Name: N/A    Number of Children: N/A  . Years of Education: N/A   Occupational History  . Not on file.   Social History Main Topics  . Smoking status: Former Games developer  . Smokeless tobacco: Not on file  . Alcohol Use: Yes     Comment: occasionally  . Drug Use: No  . Sexual  Activity: Not Currently     Comment: "with a woman"   Other Topics Concern  . Not on file   Social History Narrative  . No narrative on file   Review of Systems  Constitutional: Negative.   HENT: Positive for ear pain (ocassionaly).   Eyes: Negative.   Respiratory: Negative.   Cardiovascular: Positive for chest pain (occasionaly). Negative for palpitations, orthopnea, claudication, leg swelling and PND.  Gastrointestinal: Negative.   Genitourinary: Negative.   Musculoskeletal: Negative.   Neurological: Positive for tingling (hands ) and headaches. Negative for dizziness.  Endo/Heme/Allergies: Positive for polydipsia. Negative for environmental allergies. Does not bruise/bleed easily.  Psychiatric/Behavioral: Negative.       Objective:   Filed Vitals:   12/04/13 1138  BP: 111/75  Pulse: 89  Temp: 98 F (36.7 C)  Resp: 22    Physical Exam: Constitutional: Patient appears well-developed and well-nourished. No distress. HENT: Normocephalic, atraumatic, External right and left ear normal. Oropharynx is clear and moist. Poor dentition, swollen and red gums Eyes: Conjunctivae and EOM are normal. PERRLA, no scleral icterus. Neck: Normal ROM. Neck supple. No JVD. No tracheal deviation. No thyromegaly. CVS: RRR, S1/S2 +, no murmurs, no gallops, no carotid bruit.  Pulmonary: Effort  and breath sounds normal, no stridor, rhonchi, wheezes, rales.  Abdominal: Soft. BS +, no distension, tenderness, rebound or guarding.  Musculoskeletal: Normal range of motion. No edema and no tenderness.  Lymphadenopathy: No lymphadenopathy noted, cervical, inguinal or axillary Neuro: Alert. Normal reflexes, muscle tone coordination. No cranial nerve deficit. Skin: Skin is warm and dry. No rash noted. Not diaphoretic. No erythema. No pallor. Psychiatric: Normal mood and affect. Behavior, judgment, thought content normal.  Lab Results  Component Value Date   WBC 14.1* 10/10/2012   HGB 12.9  10/10/2012   HCT 38.0 10/10/2012   MCV 97.1 10/10/2012   PLT 247 10/10/2012   Lab Results  Component Value Date   CREATININE 0.90 10/10/2012   BUN 14 10/10/2012   NA 142 10/10/2012   K 3.8 10/10/2012   CL 108 10/10/2012   CO2 25 08/14/2012    No results found for this basename: HGBA1C   Lipid Panel  No results found for this basename: chol, trig, hdl, cholhdl, vldl, ldlcalc       Assessment and plan:   Paula Wiggins was seen today for establish care and dental pain.  Diagnoses and associated orders for this visit:  Pain, dental - amoxicillin (AMOXIL) 500 MG capsule; Take 1 capsule (500 mg total) by mouth 3 (three) times daily. - ibuprofen (ADVIL,MOTRIN) 800 MG tablet; Take 1 tablet (800 mg total) by mouth every 8 (eight) hours as needed.  Encounter to establish care - TSH; Future - COMPLETE METABOLIC PANEL WITH GFR; Future - CBC; Future - Hemoglobin A1C; Future - Vit D  25 hydroxy (rtn osteoporosis monitoring); Future - Lipid panel; Future   Return in about 1 week (around 12/11/2013) for Lab Visit.    Holland CommonsKECK, Liviana Mills, NP-C [facility] [phone] 12/05/2013, 7:24 PM

## 2013-12-11 ENCOUNTER — Other Ambulatory Visit: Payer: Self-pay

## 2014-02-02 ENCOUNTER — Ambulatory Visit: Payer: No Typology Code available for payment source | Attending: Family Medicine

## 2014-02-02 DIAGNOSIS — Z7689 Persons encountering health services in other specified circumstances: Secondary | ICD-10-CM

## 2014-02-02 LAB — CBC
HCT: 36.4 % (ref 36.0–46.0)
HEMOGLOBIN: 12.7 g/dL (ref 12.0–15.0)
MCH: 32.3 pg (ref 26.0–34.0)
MCHC: 34.9 g/dL (ref 30.0–36.0)
MCV: 92.6 fL (ref 78.0–100.0)
Platelets: 310 10*3/uL (ref 150–400)
RBC: 3.93 MIL/uL (ref 3.87–5.11)
RDW: 12.7 % (ref 11.5–15.5)
WBC: 10.6 10*3/uL — ABNORMAL HIGH (ref 4.0–10.5)

## 2014-02-02 LAB — LIPID PANEL
CHOL/HDL RATIO: 3.5 ratio
Cholesterol: 156 mg/dL (ref 0–200)
HDL: 44 mg/dL (ref >39)
LDL Cholesterol: 91 mg/dL (ref 0–99)
Triglycerides: 106 mg/dL (ref <150)
VLDL: 21 mg/dL (ref 0–40)

## 2014-02-02 LAB — HEMOGLOBIN A1C
Hgb A1c MFr Bld: 5.7 % — ABNORMAL HIGH (ref <5.7)
Mean Plasma Glucose: 117 mg/dL — ABNORMAL HIGH (ref <117)

## 2014-02-02 LAB — COMPLETE METABOLIC PANEL WITH GFR
ALT: 8 U/L (ref 0–35)
AST: 14 U/L (ref 0–37)
Albumin: 4.2 g/dL (ref 3.5–5.2)
Alkaline Phosphatase: 73 U/L (ref 39–117)
BILIRUBIN TOTAL: 0.3 mg/dL (ref 0.2–1.2)
BUN: 13 mg/dL (ref 6–23)
CO2: 24 mEq/L (ref 19–32)
Calcium: 9.4 mg/dL (ref 8.4–10.5)
Chloride: 101 mEq/L (ref 96–112)
Creat: 0.76 mg/dL (ref 0.50–1.10)
GFR, Est African American: >89 mL/min
GFR, Est Non African American: >89 mL/min
Glucose, Bld: 92 mg/dL (ref 70–99)
POTASSIUM: 4.5 meq/L (ref 3.5–5.3)
Sodium: 138 mEq/L (ref 135–145)
Total Protein: 7.3 g/dL (ref 6.0–8.3)

## 2014-02-03 LAB — VITAMIN D 25 HYDROXY (VIT D DEFICIENCY, FRACTURES): VIT D 25 HYDROXY: 18 ng/mL — AB (ref 30–89)

## 2014-02-03 LAB — TSH: TSH: 4.468 u[IU]/mL (ref 0.350–4.500)

## 2014-02-06 ENCOUNTER — Telehealth: Payer: Self-pay | Admitting: Emergency Medicine

## 2014-02-06 MED ORDER — VITAMIN D (ERGOCALCIFEROL) 1.25 MG (50000 UNIT) PO CAPS: 50000.0000 [IU] | ORAL_CAPSULE | ORAL | Status: DC

## 2014-02-06 NOTE — Telephone Encounter (Signed)
Message copied by Darlis Loan on Tue Feb 06, 2014 10:57 AM ------      Message from: Holland Commons A      Created: Mon Feb 05, 2014 10:45 PM       Labs are normal with the exception of low vitamin d.  Please send patient drisdol 50,000 IU to take once weekly on a Sunday for 10 weeks.  No refills. ------

## 2014-02-06 NOTE — Telephone Encounter (Signed)
Pt given test results. Medication ordered and e-scribed to [facility] per pt request

## 2014-02-10 ENCOUNTER — Encounter (HOSPITAL_COMMUNITY): Payer: Self-pay | Admitting: Emergency Medicine

## 2014-02-10 ENCOUNTER — Emergency Department (HOSPITAL_COMMUNITY)
Admission: EM | Admit: 2014-02-10 | Discharge: 2014-02-10 | Disposition: A | Discharge status: Home / Self Care | Payer: No Typology Code available for payment source | Attending: Emergency Medicine | Admitting: Emergency Medicine

## 2014-02-10 ENCOUNTER — Emergency Department (HOSPITAL_COMMUNITY): Payer: No Typology Code available for payment source

## 2014-02-10 DIAGNOSIS — J45909 Unspecified asthma, uncomplicated: Secondary | ICD-10-CM | POA: Insufficient documentation

## 2014-02-10 DIAGNOSIS — M6580 Other synovitis and tenosynovitis, unspecified site: Secondary | ICD-10-CM | POA: Insufficient documentation

## 2014-02-10 DIAGNOSIS — M79609 Pain in unspecified limb: Secondary | ICD-10-CM | POA: Insufficient documentation

## 2014-02-10 DIAGNOSIS — Z87891 Personal history of nicotine dependence: Secondary | ICD-10-CM | POA: Insufficient documentation

## 2014-02-10 DIAGNOSIS — M778 Other enthesopathies, not elsewhere classified: Secondary | ICD-10-CM

## 2014-02-10 DIAGNOSIS — M25519 Pain in unspecified shoulder: Secondary | ICD-10-CM | POA: Insufficient documentation

## 2014-02-10 DIAGNOSIS — Z792 Long term (current) use of antibiotics: Secondary | ICD-10-CM | POA: Insufficient documentation

## 2014-02-10 DIAGNOSIS — Z79899 Other long term (current) drug therapy: Secondary | ICD-10-CM | POA: Insufficient documentation

## 2014-02-10 MED ORDER — HYDROCODONE-ACETAMINOPHEN 5-325 MG PO TABS: 1.0000 | ORAL_TABLET | Freq: Four times a day (QID) | ORAL | Status: DC | PRN

## 2014-02-10 MED ORDER — PREDNISONE 50 MG PO TABS: 50.0000 mg | ORAL_TABLET | Freq: Every day | ORAL | Status: DC

## 2014-02-10 NOTE — Discharge Instructions (Signed)
Return here as needed.  Followup with your primary care doctor use ice and heat on your neck and elbow

## 2014-02-10 NOTE — ED Provider Notes (Signed)
CSN: [id number]     Arrival date & time 02/10/14  1800 History  This chart was scribed for non-physician practitioner, Charlestine Night, PA-C, working with Raeford Razor, MD, by Modena Jansky, ED Scribe. This patient was seen in room WTR7/WTR7 and the patient's care was started at 6:55 PM.   Chief Complaint  Patient presents with  . Arm Pain   The history is provided by the patient. No language interpreter was used.   HPI Comments: Paula Wiggins is a 23 y.o. female who presents to the Emergency Department complaining of constant moderate right arm pain that started today. Pt reports that the pain was onset upon waking up this morning. She denies any injury or trauma. She states that the pain radiates up and down her right arm. She describes the pain as a sharp sensation. She reports that the pain is exacerbated by moving her right elbow or shoulder. She states that the pain is relieved by holding the arm still. She states that she has associated neck pain and tingling in her right hand.   [facility]- [facility] Past Medical History  Diagnosis Date  . Asthma    History reviewed. No pertinent past surgical history. Family History  Problem Relation Age of Onset  . Hypertension Mother   . Seizures Father   . Hypertension Father    History  Substance Use Topics  . Smoking status: Former Games developer  . Smokeless tobacco: Not on file  . Alcohol Use: Yes     Comment: occasionally   OB History   Grav Para Term Preterm Abortions TAB SAB Ect Mult Living                 Review of Systems  Musculoskeletal: Positive for myalgias and neck pain.  All other systems reviewed and are negative.   Allergies  Oxycodone  Home Medications   Prior to Admission medications   Medication Sig Start Date End Date Taking? Authorizing Provider  albuterol (PROVENTIL HFA;VENTOLIN HFA) 108 (90 BASE) MCG/ACT inhaler Inhale 2 puffs into the lungs every 6 (six) hours as needed for wheezing or  shortness of breath.    Historical Provider, MD  amoxicillin (AMOXIL) 500 MG capsule Take 1 capsule (500 mg total) by mouth 3 (three) times daily. 12/04/13   Ambrose Finland, NP  cyclobenzaprine (FLEXERIL) 10 MG tablet Take 1 tablet (10 mg total) by mouth 3 (three) times daily as needed for muscle spasms. 05/04/13   Trixie Dredge, PA-C  HYDROcodone-acetaminophen (NORCO/VICODIN) 5-325 MG per tablet Take 1 tablet by mouth every 6 (six) hours as needed for moderate pain. 02/10/14   Jamesetta Orleans Zsazsa Bahena, PA-C  ibuprofen (ADVIL,MOTRIN) 200 MG tablet Take 400 mg by mouth every 6 (six) hours as needed (toothache).     Historical Provider, MD  ibuprofen (ADVIL,MOTRIN) 800 MG tablet Take 1 tablet (800 mg total) by mouth every 8 (eight) hours as needed. 12/04/13   Ambrose Finland, NP  predniSONE (DELTASONE) 50 MG tablet Take 1 tablet (50 mg total) by mouth daily. 02/10/14   Jamesetta Orleans Manolito Jurewicz, PA-C  Vitamin D, Ergocalciferol, (DRISDOL) 50000 UNITS CAPS capsule Take 1 capsule (50,000 Units total) by mouth every 7 (seven) days. 02/06/14   Ambrose Finland, NP   BP 130/77  Pulse 98  Temp(Src) 98.8 F (37.1 C) (Oral)  Resp 18  SpO2 99%  LMP 01/31/2014 Physical Exam  Nursing note and vitals reviewed. Constitutional: She is oriented to person, place, and time. She appears well-developed  and well-nourished. No distress.  HENT:  Head: Normocephalic and atraumatic.  Neck: Neck supple. No tracheal deviation present.  Cardiovascular: Normal rate.   Pulmonary/Chest: Effort normal. No respiratory distress.  Musculoskeletal: Normal range of motion. She exhibits tenderness.  Some tendonitis in the upper epicondyl area.  TTP in right forearm. Mild TTP at the trapezius.  Normal ROM in neck, forearm, and elbow.    Neurological: She is alert and oriented to person, place, and time.  Strength and sensations intact.  Skin: Skin is warm and dry.  Psychiatric: She has a normal mood and affect. Her behavior is normal.    ED  Course  Procedures (including critical care time) DIAGNOSTIC STUDIES: Oxygen Saturation is 99% on RA, normal by my interpretation.    COORDINATION OF CARE: 6:59 PM- Pt advised of plan for treatment which includes radiology and pt agrees.  Labs Review Labs Reviewed - No data to display  Imaging Review Dg Elbow Complete Right  02/10/2014   CLINICAL DATA:  Anterior elbow pain.  No known injury.  EXAM: RIGHT ELBOW - COMPLETE 3+ VIEW  COMPARISON:  None.  FINDINGS: There is no evidence of fracture, dislocation, or joint effusion. There is no evidence of arthropathy or other focal bone abnormality. Soft tissues are unremarkable.  IMPRESSION: Negative.   Electronically Signed   By: Rosalie Gums M.D.   On: 02/10/2014 18:48   patient be referred to orthopedics, as needed.  Told to return here as needed.  Patient most likely has tendinitis of the elbow  I personally performed the services described in this documentation, which was scribed in my presence. The recorded information has been reviewed and is accurate.     Carlyle Dolly, PA-C 02/13/14 0121

## 2014-02-10 NOTE — ED Notes (Signed)
Pt reports waking up this morning with pain in her right elbow. Pt denies fall or injury. Pt states the pain is radiating up and down the arm. Pt is A/O x4, in NAD, and vitals are WDL.

## 2014-02-15 NOTE — ED Provider Notes (Signed)
Medical screening examination/treatment/procedure(s) were performed by non-physician practitioner and as supervising physician I was immediately available for consultation/collaboration.   EKG Interpretation None       Sargon Scouten, MD 02/15/14 1344 

## 2014-03-26 ENCOUNTER — Ambulatory Visit: Payer: No Typology Code available for payment source | Attending: Internal Medicine | Admitting: Internal Medicine

## 2014-03-26 ENCOUNTER — Encounter: Payer: Self-pay | Admitting: Internal Medicine

## 2014-03-26 VITALS — BP 128/81 | HR 89 | Temp 98.0°F | Resp 16 | Ht 63.0 in | Wt 222.0 lb

## 2014-03-26 DIAGNOSIS — K088 Other specified disorders of teeth and supporting structures: Secondary | ICD-10-CM | POA: Insufficient documentation

## 2014-03-26 DIAGNOSIS — M25561 Pain in right knee: Secondary | ICD-10-CM | POA: Insufficient documentation

## 2014-03-26 DIAGNOSIS — Z87891 Personal history of nicotine dependence: Secondary | ICD-10-CM | POA: Insufficient documentation

## 2014-03-26 DIAGNOSIS — J4521 Mild intermittent asthma with (acute) exacerbation: Secondary | ICD-10-CM

## 2014-03-26 DIAGNOSIS — J45909 Unspecified asthma, uncomplicated: Secondary | ICD-10-CM | POA: Insufficient documentation

## 2014-03-26 DIAGNOSIS — K0889 Other specified disorders of teeth and supporting structures: Secondary | ICD-10-CM

## 2014-03-26 MED ORDER — ALBUTEROL SULFATE HFA 108 (90 BASE) MCG/ACT IN AERS
2.0000 | INHALATION_SPRAY | Freq: Four times a day (QID) | RESPIRATORY_TRACT | Status: DC | PRN
Start: 2014-03-26 — End: 2015-06-12

## 2014-03-26 MED ORDER — PREDNISONE 20 MG PO TABS: 20.0000 mg | ORAL_TABLET | Freq: Every day | ORAL | Status: DC

## 2014-03-26 MED ORDER — IBUPROFEN 800 MG PO TABS: 800.0000 mg | ORAL_TABLET | Freq: Three times a day (TID) | ORAL | Status: DC | PRN

## 2014-03-26 NOTE — Progress Notes (Signed)
Pt is here following up on her anemia and her pain in her right knee. Pt is requesting a referral to a dentist.

## 2014-03-26 NOTE — Progress Notes (Signed)
Patient ID: Paula Wiggins, female   DOB: 1990-07-16, 23 y.o.   MRN: [id number]  CC: dental referral   HPI:  Patient presents to clinic today for a follow up of anemia.  She states that she was put on iron pills a while back and now she has completed all the pills.  She also reports that she has been having dental pain on both sides of her mouth for the past two months.  The pain is mostly in the molars.  She reports that she has been having gum swelling and facial swelling on the left side of her face three days ago that is now resolved.   Been having right knee pain for past two months that feels like her knee has been giving out.  She notes some sweling with prolonged standing.   She c/o of more asthma attacks for the past couple of weeks.  She states that she has been having asthma attacks twice weekly in the morning upon awaking. She has been out of her albuterol for a couple of weeks.  She notes that she has had a cold for one week and has been taking mucinex and nyquil. She has congestion, rhinitis, and headaches.    Allergies  Allergen Reactions  . Oxycodone Swelling    Shortness of breath   Past Medical History  Diagnosis Date  . Asthma    Current Outpatient Prescriptions on File Prior to Visit  Medication Sig Dispense Refill  . albuterol (PROVENTIL HFA;VENTOLIN HFA) 108 (90 BASE) MCG/ACT inhaler Inhale 2 puffs into the lungs every 6 (six) hours as needed for wheezing or shortness of breath.    Marland Kitchen. ibuprofen (ADVIL,MOTRIN) 200 MG tablet Take 400 mg by mouth every 6 (six) hours as needed (toothache).      No current facility-administered medications on file prior to visit.   Family History  Problem Relation Age of Onset  . Hypertension Mother   . Seizures Father   . Hypertension Father    History   Social History  . Marital Status: Single    Spouse Name: N/A    Number of Children: N/A  . Years of Education: N/A   Occupational History  . Not on file.   Social History  Main Topics  . Smoking status: Former Games developermoker  . Smokeless tobacco: Not on file  . Alcohol Use: Yes     Comment: occasionally  . Drug Use: No  . Sexual Activity: Not Currently     Comment: "with a woman"   Other Topics Concern  . Not on file   Social History Narrative    Review of Systems: See HPI    Objective:   Filed Vitals:   03/26/14 1548  BP: 128/81  Pulse: 89  Temp: 98 F (36.7 C)  Resp: 16    Physical Exam  Constitutional: She is oriented to person, place, and time.  HENT:  Right Ear: External ear normal.  Left Ear: External ear normal.  Mouth/Throat: Oropharynx is clear and moist.  Pieces of second to last molars on each side present-appears as if they have been pulled out by patient Bilateral wisdom teeth present   Eyes: EOM are normal. Pupils are equal, round, and reactive to light.  Cardiovascular: Normal rate, regular rhythm and normal heart sounds.   Pulmonary/Chest: Effort normal and breath sounds normal.  Musculoskeletal: She exhibits tenderness (mild patellar tenderness). She exhibits no edema.  Lymphadenopathy:    She has no cervical adenopathy.  Neurological: She is  alert and oriented to person, place, and time. She has normal reflexes.  Skin: Skin is warm and dry.     Lab Results  Component Value Date   WBC 10.6* 02/02/2014   HGB 12.7 02/02/2014   HCT 36.4 02/02/2014   MCV 92.6 02/02/2014   PLT 310 02/02/2014   Lab Results  Component Value Date   CREATININE 0.76 02/02/2014   BUN 13 02/02/2014   NA 138 02/02/2014   K 4.5 02/02/2014   CL 101 02/02/2014   CO2 24 02/02/2014    Lab Results  Component Value Date   HGBA1C 5.7* 02/02/2014   Lipid Panel     Component Value Date/Time   CHOL 156 02/02/2014 0910   TRIG 106 02/02/2014 0910   HDL 44 02/02/2014 0910   CHOLHDL 3.5 02/02/2014 0910   VLDL 21 02/02/2014 0910   LDLCALC 91 02/02/2014 0910       Assessment and plan:   Paula Wiggins was seen today for follow-up.  Diagnoses  and associated orders for this visit:  Pain, dental - Ambulatory referral to Dentistry - ibuprofen (ADVIL,MOTRIN) 800 MG tablet; Take 1 tablet (800 mg total) by mouth every 8 (eight) hours as needed. Explained to patient the importance of seeking dental care. May use ibuprofen 800 mg as directed for pain.  Patient given dental referral list  Asthma, mild intermittent, with acute exacerbation - albuterol (PROVENTIL HFA;VENTOLIN HFA) 108 (90 BASE) MCG/ACT inhaler; Inhale 2 puffs into the lungs every 6 (six) hours as needed for wheezing or shortness of breath. - predniSONE (DELTASONE) 20 MG tablet; Take 1 tablet (20 mg total) by mouth daily with breakfast. Patient given albuterol and steroids. Explained that patient may use albuterol inhaler 2 puffs every 4-6 hours. If patient notices increased SOB without relief from rescue inhaler he/she will need to RTC or seek care at nearest ER.  Return if symptoms worsen or fail to improve.       Holland CommonsKECK, VALERIE, NP-C [facility] [phone] 03/26/2014, 4:01 PM

## 2014-04-18 ENCOUNTER — Encounter (HOSPITAL_COMMUNITY): Payer: Self-pay | Admitting: Emergency Medicine

## 2014-04-18 ENCOUNTER — Emergency Department (HOSPITAL_COMMUNITY)
Admission: EM | Admit: 2014-04-18 | Discharge: 2014-04-18 | Disposition: A | Discharge status: Home / Self Care | Payer: No Typology Code available for payment source | Attending: Emergency Medicine | Admitting: Emergency Medicine

## 2014-04-18 DIAGNOSIS — H539 Unspecified visual disturbance: Secondary | ICD-10-CM | POA: Insufficient documentation

## 2014-04-18 DIAGNOSIS — Z79899 Other long term (current) drug therapy: Secondary | ICD-10-CM | POA: Insufficient documentation

## 2014-04-18 DIAGNOSIS — Z87891 Personal history of nicotine dependence: Secondary | ICD-10-CM | POA: Insufficient documentation

## 2014-04-18 DIAGNOSIS — R590 Localized enlarged lymph nodes: Secondary | ICD-10-CM | POA: Insufficient documentation

## 2014-04-18 DIAGNOSIS — H53149 Visual discomfort, unspecified: Secondary | ICD-10-CM | POA: Insufficient documentation

## 2014-04-18 DIAGNOSIS — K029 Dental caries, unspecified: Secondary | ICD-10-CM | POA: Insufficient documentation

## 2014-04-18 DIAGNOSIS — J45909 Unspecified asthma, uncomplicated: Secondary | ICD-10-CM | POA: Insufficient documentation

## 2014-04-18 DIAGNOSIS — Z7952 Long term (current) use of systemic steroids: Secondary | ICD-10-CM | POA: Insufficient documentation

## 2014-04-18 DIAGNOSIS — R51 Headache: Secondary | ICD-10-CM | POA: Insufficient documentation

## 2014-04-18 MED ORDER — AMOXICILLIN 500 MG PO CAPS
500.0000 mg | ORAL_CAPSULE | Freq: Once | ORAL | Status: AC
  Administered 2014-04-18: 500 mg via ORAL
  Filled 2014-04-18: qty 1

## 2014-04-18 MED ORDER — AMOXICILLIN 500 MG PO CAPS: 500.0000 mg | ORAL_CAPSULE | Freq: Three times a day (TID) | ORAL | Status: DC

## 2014-04-18 NOTE — Discharge Instructions (Signed)
Cervical Adenitis °You have a swollen lymph gland in your neck. This commonly happens with Strep and virus infections, dental problems, insect bites, and injuries about the face, scalp, or neck. The lymph glands swell as the body fights the infection or heals the injury. Swelling and firmness typically lasts for several weeks after the infection or injury is healed. Rarely lymph glands can become swollen because of cancer or TB. °Antibiotics are prescribed if there is evidence of an infection. Sometimes an infected lymph gland becomes filled with pus. This condition may require opening up the abscessed gland by draining it surgically. Most of the time infected glands return to normal within two weeks. Do not poke or squeeze the swollen lymph nodes. That may keep them from shrinking back to their normal size. If the lymph gland is still swollen after 2 weeks, further medical evaluation is needed.  °SEEK IMMEDIATE MEDICAL CARE IF:  °You have difficulty swallowing or breathing, increased swelling, severe pain, or a high fever.  °Document Released: 05/04/2005 Document Revised: 07/27/2011 Document Reviewed: 10/24/2006 °[employer]® Patient Information ©2015 [employer]. This information is not intended to replace advice given to you by your health care provider. Make sure you discuss any questions you have with your health care provider. ° °

## 2014-04-18 NOTE — ED Notes (Signed)
Pt states she has had two 'knots' in her neck which are causing her to have a headache and not be able to move her head fully. Alert and oriented. Started yesterday.

## 2014-04-18 NOTE — ED Provider Notes (Signed)
CSN: [id number]     Arrival date & time 04/18/14  1801 History  This chart was scribed for Dorthula Matasiffany G Kerolos Nehme, PA-C working with Mirian MoMatthew Gentry, MD by Evon Slackerrance Branch, ED Scribe. This patient was seen in room WTR8/WTR8 and the patient's care was started at 7:04 PM.    Chief Complaint  Patient presents with  . Neck Pain   Patient is a 23 y.o. female presenting with neck pain. The history is provided by the patient. No language interpreter was used.  Neck Pain Associated symptoms: headaches and photophobia    HPI Comments: Paula Wiggins is a 23 y.o. female who presents to the Emergency Department complaining of neck pain onset 1 day ago. Pt states she has two "knots" that are tender to palpation on the right side of her neck. She states that she has had a headache,  photophobia and phonophobia. She states that the headache has been gradually improving through out the day. She denies cough or sore throat. She states she has a Hx of migraines. No neck pain, fevers, cough, nasal congestion, dental pains, sore throat.  Past Medical History  Diagnosis Date  . Asthma    History reviewed. No pertinent past surgical history. Family History  Problem Relation Age of Onset  . Hypertension Mother   . Seizures Father   . Hypertension Father    History  Substance Use Topics  . Smoking status: Former Games developermoker  . Smokeless tobacco: Not on file  . Alcohol Use: Yes     Comment: occasionally   OB History    No data available     Review of Systems  HENT: Negative for sore throat.   Eyes: Positive for photophobia and visual disturbance.  Respiratory: Negative for cough.   Musculoskeletal: Positive for neck pain.  Neurological: Positive for headaches.  All other systems reviewed and are negative.   Allergies  Oxycodone  Home Medications   Prior to Admission medications   Medication Sig Start Date End Date Taking? Authorizing Provider  albuterol (PROVENTIL HFA;VENTOLIN HFA) 108 (90 BASE)  MCG/ACT inhaler Inhale 2 puffs into the lungs every 6 (six) hours as needed for wheezing or shortness of breath. 03/26/14   Ambrose FinlandValerie A Keck, NP  amoxicillin (AMOXIL) 500 MG capsule Take 1 capsule (500 mg total) by mouth 3 (three) times daily. 04/18/14   Dusten Ellinwood Irine SealG Jaymison Luber, PA-C  ibuprofen (ADVIL,MOTRIN) 200 MG tablet Take 400 mg by mouth every 6 (six) hours as needed (toothache).     Historical Provider, MD  ibuprofen (ADVIL,MOTRIN) 800 MG tablet Take 1 tablet (800 mg total) by mouth every 8 (eight) hours as needed. 03/26/14   Ambrose FinlandValerie A Keck, NP  predniSONE (DELTASONE) 20 MG tablet Take 1 tablet (20 mg total) by mouth daily with breakfast. 03/26/14   Ambrose FinlandValerie A Keck, NP   Triage Vitals: BP 107/69 mmHg  Pulse 97  Temp(Src) 98.1 F (36.7 C) (Oral)  SpO2 100%  LMP 03/24/2014   Physical Exam  Constitutional: She is oriented to person, place, and time. She appears well-developed and well-nourished. No distress.  HENT:  Head: Normocephalic and atraumatic. Head is without contusion. Hair is normal.  Right Ear: Tympanic membrane and ear canal normal.  Left Ear: Tympanic membrane and ear canal normal.  Nose: Nose normal. Right sinus exhibits no maxillary sinus tenderness and no frontal sinus tenderness. Left sinus exhibits no maxillary sinus tenderness and no frontal sinus tenderness.  Mouth/Throat: Uvula is midline, oropharynx is clear and moist and mucous membranes are  normal. Dental caries present.  Eyes: Conjunctivae and EOM are normal.  Neck: Neck supple.  Cardiovascular: Normal rate.   Pulmonary/Chest: Effort normal. No respiratory distress. She has no wheezes. She has no rales.  Abdominal: She exhibits no distension. There is no tenderness. There is no rebound.  Musculoskeletal: Normal range of motion.  Lymphadenopathy:    She has cervical adenopathy.       Right cervical: Superficial cervical adenopathy present.    She has no axillary adenopathy.  Neurological: She is alert and oriented to  person, place, and time.  Skin: Skin is warm and dry.  Psychiatric: She has a normal mood and affect. Her behavior is normal.  Nursing note and vitals reviewed.   ED Course  Procedures (including critical care time) DIAGNOSTIC STUDIES: Oxygen Saturation is 100% on RA, normal by my interpretation.    COORDINATION OF CARE:. 7:11 PM-Discussed treatment plan which includes antibiotics with pt at bedside and pt agreed to plan.    Labs Review Labs Reviewed - No data to display  Imaging Review No results found.   EKG Interpretation None      MDM   Final diagnoses:  Cervical lymphadenopathy    Patient with lymphadenopathy of two superficial cervical nodes. They are smooth, mobile, non indurated, no crepitus and approx 1 cm large.  Patient does not have any sick symptoms aside from headache today which has since resolved. Will start on abx to treat any possible bacterial etiology although likely the symptoms are viral.  Given strict return to ED precautions of fever, worsening size of nodes, neck pain, headache.  23 y.o.Paula Wiggins's evaluation in the Emergency Department is complete. It has been determined that no acute conditions requiring further emergency intervention are present at this time. The patient/guardian have been advised of the diagnosis and plan. We have discussed signs and symptoms that warrant return to the ED, such as changes or worsening in symptoms.  Vital signs are stable at discharge. Filed Vitals:   04/18/14 1813  BP: 107/69  Pulse: 97  Temp: 98.1 F (36.7 C)    Patient/guardian has voiced understanding and agreed to follow-up with the PCP or specialist.   I personally performed the services described in this documentation, which was scribed in my presence. The recorded information has been reviewed and is accurate.     Dorthula Matasiffany G Deloyd Handy, PA-C 04/18/14 1959  Mirian MoMatthew Gentry, MD 04/22/14 [phone]

## 2014-12-29 ENCOUNTER — Emergency Department (HOSPITAL_COMMUNITY)
Admission: EM | Admit: 2014-12-29 | Discharge: 2014-12-29 | Disposition: A | Discharge status: Home / Self Care | Payer: Medicaid Other | Attending: Emergency Medicine | Admitting: Emergency Medicine

## 2014-12-29 ENCOUNTER — Emergency Department (HOSPITAL_COMMUNITY): Payer: Medicaid Other

## 2014-12-29 ENCOUNTER — Encounter (HOSPITAL_COMMUNITY): Payer: Self-pay | Admitting: Emergency Medicine

## 2014-12-29 DIAGNOSIS — J45901 Unspecified asthma with (acute) exacerbation: Secondary | ICD-10-CM | POA: Insufficient documentation

## 2014-12-29 DIAGNOSIS — Z79899 Other long term (current) drug therapy: Secondary | ICD-10-CM | POA: Insufficient documentation

## 2014-12-29 DIAGNOSIS — Z87891 Personal history of nicotine dependence: Secondary | ICD-10-CM | POA: Insufficient documentation

## 2014-12-29 MED ORDER — PREDNISONE 20 MG PO TABS: 60.0000 mg | ORAL_TABLET | Freq: Every day | ORAL | Status: DC

## 2014-12-29 MED ORDER — PREDNISONE 20 MG PO TABS
60.0000 mg | ORAL_TABLET | Freq: Once | ORAL | Status: AC
  Administered 2014-12-29: 60 mg via ORAL
  Filled 2014-12-29: qty 3

## 2014-12-29 MED ORDER — ALBUTEROL SULFATE HFA 108 (90 BASE) MCG/ACT IN AERS
2.0000 | INHALATION_SPRAY | Freq: Once | RESPIRATORY_TRACT | Status: AC
  Administered 2014-12-29: 2 via RESPIRATORY_TRACT
  Filled 2014-12-29: qty 6.7

## 2014-12-29 MED ORDER — IPRATROPIUM-ALBUTEROL 0.5-2.5 (3) MG/3ML IN SOLN
3.0000 mL | RESPIRATORY_TRACT | Status: DC
  Administered 2014-12-29: 3 mL via RESPIRATORY_TRACT
  Filled 2014-12-29: qty 3

## 2014-12-29 NOTE — ED Notes (Signed)
Pt from home c/o shortness of breath mainly at night. She reports dizziness with the shortness of breath. Pt speaking in full sentences. Lung sounds clear bilaterally.

## 2014-12-29 NOTE — Discharge Instructions (Signed)
Asthma Attack Prevention Paula Wiggins, use your albuterol inhaler every 6 hours for the next 2 days. Then use it only as needed. Take prednisone for the next 4 days. She is primary care physician within 3 days for close follow-up. If symptoms worsen come back to emergency department immediately. Thank you.   Although there is no way to prevent asthma from starting, you can take steps to control the disease and reduce its symptoms. Learn about your asthma and how to control it. Take an active role to control your asthma by working with your health care provider to create and follow an asthma action plan. An asthma action plan guides you in:  Taking your medicines properly.  Avoiding things that set off your asthma or make your asthma worse (asthma triggers).  Tracking your level of asthma control.  Responding to worsening asthma.  Seeking emergency care when needed. To track your asthma, keep records of your symptoms, check your peak flow number using a handheld device that shows how well air moves out of your lungs (peak flow meter), and get regular asthma checkups.  WHAT ARE SOME WAYS TO PREVENT AN ASTHMA ATTACK?  Take medicines as directed by your health care provider.  Keep track of your asthma symptoms and level of control.  With your health care provider, write a detailed plan for taking medicines and managing an asthma attack. Then be sure to follow your action plan. Asthma is an ongoing condition that needs regular monitoring and treatment.  Identify and avoid asthma triggers. Many outdoor allergens and irritants (such as pollen, mold, cold air, and air pollution) can trigger asthma attacks. Find out what your asthma triggers are and take steps to avoid them.  Monitor your breathing. Learn to recognize warning signs of an attack, such as coughing, wheezing, or shortness of breath. Your lung function may decrease before you notice any signs or symptoms, so regularly measure and record  your peak airflow with a home peak flow meter.  Identify and treat attacks early. If you act quickly, you are less likely to have a severe attack. You will also need less medicine to control your symptoms. When your peak flow measurements decrease and alert you to an upcoming attack, take your medicine as instructed and immediately stop any activity that may have triggered the attack. If your symptoms do not improve, get medical help.  Pay attention to increasing quick-relief inhaler use. If you find yourself relying on your quick-relief inhaler, your asthma is not under control. See your health care provider about adjusting your treatment. WHAT CAN MAKE MY SYMPTOMS WORSE? A number of common things can set off or make your asthma symptoms worse and cause temporary increased inflammation of your airways. Keep track of your asthma symptoms for several weeks, detailing all the environmental and emotional factors that are linked with your asthma. When you have an asthma attack, go back to your asthma diary to see which factor, or combination of factors, might have contributed to it. Once you know what these factors are, you can take steps to control many of them. If you have allergies and asthma, it is important to take asthma prevention steps at home. Minimizing contact with the substance to which you are allergic will help prevent an asthma attack. Some triggers and ways to avoid these triggers are: Animal Dander:  Some people are allergic to the flakes of skin or dried saliva from animals with fur or feathers.   There is no such thing  as a hypoallergenic dog or cat breed. All dogs or cats can cause allergies, even if they don't shed.  Keep these pets out of your home.  If you are not able to keep a pet outdoors, keep the pet out of your bedroom and other sleeping areas at all times, and keep the door closed.  Remove carpets and furniture covered with cloth from your home. If that is not possible, keep  the pet away from fabric-covered furniture and carpets. Dust Mites: Many people with asthma are allergic to dust mites. Dust mites are tiny bugs that are found in every home in mattresses, pillows, carpets, fabric-covered furniture, bedcovers, clothes, stuffed toys, and other fabric-covered items.   Cover your mattress in a special dust-proof cover.  Cover your pillow in a special dust-proof cover, or wash the pillow each week in hot water. Water must be hotter than 130 F (54.4 C) to kill dust mites. Cold or warm water used with detergent and bleach can also be effective.  Wash the sheets and blankets on your bed each week in hot water.  Try not to sleep or lie on cloth-covered cushions.  Call ahead when traveling and ask for a smoke-free hotel room. Bring your own bedding and pillows in case the hotel only supplies feather pillows and down comforters, which may contain dust mites and cause asthma symptoms.  Remove carpets from your bedroom and those laid on concrete, if you can.  Keep stuffed toys out of the bed, or wash the toys weekly in hot water or cooler water with detergent and bleach. Cockroaches: Many people with asthma are allergic to the droppings and remains of cockroaches.   Keep food and garbage in closed containers. Never leave food out.  Use poison baits, traps, powders, gels, or paste (for example, boric acid).  If a spray is used to kill cockroaches, stay out of the room until the odor goes away. Indoor Mold:  Fix leaky faucets, pipes, or other sources of water that have mold around them.  Clean floors and moldy surfaces with a fungicide or diluted bleach.  Avoid using humidifiers, vaporizers, or swamp coolers. These can spread molds through the air. Pollen and Outdoor Mold:  When pollen or mold spore counts are high, try to keep your windows closed.  Stay indoors with windows closed from late morning to afternoon. Pollen and some mold spore counts are highest  at that time.  Ask your health care provider whether you need to take anti-inflammatory medicine or increase your dose of the medicine before your allergy season starts. Other Irritants to Avoid:  Tobacco smoke is an irritant. If you smoke, ask your health care provider how you can quit. Ask family members to quit smoking, too. Do not allow smoking in your home or car.  If possible, do not use a wood-burning stove, kerosene heater, or fireplace. Minimize exposure to all sources of smoke, including incense, candles, fires, and fireworks.  Try to stay away from strong odors and sprays, such as perfume, talcum powder, hair spray, and paints.  Decrease humidity in your home and use an indoor air cleaning device. Reduce indoor humidity to below 60%. Dehumidifiers or central air conditioners can do this.  Decrease house dust exposure by changing furnace and air cooler filters frequently.  Try to have someone else vacuum for you once or twice a week. Stay out of rooms while they are being vacuumed and for a short while afterward.  If you vacuum, use a  dust mask from a hardware store, a double-layered or microfilter vacuum cleaner bag, or a vacuum cleaner with a HEPA filter.  Sulfites in foods and beverages can be irritants. Do not drink beer or wine or eat dried fruit, processed potatoes, or shrimp if they cause asthma symptoms.  Cold air can trigger an asthma attack. Cover your nose and mouth with a scarf on cold or windy days.  Several health conditions can make asthma more difficult to manage, including a runny nose, sinus infections, reflux disease, psychological stress, and sleep apnea. Work with your health care provider to manage these conditions.  Avoid close contact with people who have a respiratory infection such as a cold or the flu, since your asthma symptoms may get worse if you catch the infection. Wash your hands thoroughly after touching items that may have been handled by people  with a respiratory infection.  Get a flu shot every year to protect against the flu virus, which often makes asthma worse for days or weeks. Also get a pneumonia shot if you have not previously had one. Unlike the flu shot, the pneumonia shot does not need to be given yearly. Medicines:  Talk to your health care provider about whether it is safe for you to take aspirin or non-steroidal anti-inflammatory medicines (NSAIDs). In a small number of people with asthma, aspirin and NSAIDs can cause asthma attacks. These medicines must be avoided by people who have known aspirin-sensitive asthma. It is important that people with aspirin-sensitive asthma read labels of all over-the-counter medicines used to treat pain, colds, coughs, and fever.  Beta-blockers and ACE inhibitors are other medicines you should discuss with your health care provider. HOW CAN I FIND OUT WHAT I AM ALLERGIC TO? Ask your asthma health care provider about allergy skin testing or blood testing (the RAST test) to identify the allergens to which you are sensitive. If you are found to have allergies, the most important thing to do is to try to avoid exposure to any allergens that you are sensitive to as much as possible. Other treatments for allergies, such as medicines and allergy shots (immunotherapy) are available.  CAN I EXERCISE? Follow your health care provider's advice regarding asthma treatment before exercising. It is important to maintain a regular exercise program, but vigorous exercise or exercise in cold, humid, or dry environments can cause asthma attacks, especially for those people who have exercise-induced asthma. Document Released: 04/22/2009 Document Revised: 05/09/2013 Document Reviewed: 11/09/2012 [facility] Patient Information 2015 [place], [place]. This information is not intended to replace advice given to you by your health care provider. Make sure you discuss any questions you have with your health care provider.

## 2014-12-29 NOTE — ED Provider Notes (Addendum)
CSN: [id number]   Arrival date & time 12/29/14 0003  History  This chart was scribed for  Tomasita Crumble, MD by Bethel Born, ED Scribe. This patient was seen in room WA08/WA08 and the patient's care was started at 2:10 AM.  Chief Complaint  Patient presents with  . Shortness of Breath    HPI The history is provided by the patient. No language interpreter was used.   Paula Wiggins is a 24 y.o. female with PMHx of asthma who presents to the Emergency Department complaining of increasing SOB with gradual onset 2 weeks ago. A borrowed home inhaler provided an hour of relief last night. The SOB is worse at night. Associated symptoms include dizziness for 2 days, cough, and central chest pain with breathing. These symptoms are consistent with her previous asthma exacerbations that she notes have numerous triggers. Pt denies runny nose. No known sick contact.   Past Medical History  Diagnosis Date  . Asthma     History reviewed. No pertinent past surgical history.  Family History  Problem Relation Age of Onset  . Hypertension Mother   . Seizures Father   . Hypertension Father     Social History  Substance Use Topics  . Smoking status: Former Games developer  . Smokeless tobacco: None  . Alcohol Use: Yes     Comment: occasionally     Review of Systems 10 Systems reviewed and all are negative for acute change except as noted in the HPI.  Home Medications   Prior to Admission medications   Medication Sig Start Date End Date Taking? Authorizing Provider  albuterol (PROVENTIL HFA;VENTOLIN HFA) 108 (90 BASE) MCG/ACT inhaler Inhale 2 puffs into the lungs every 6 (six) hours as needed for wheezing or shortness of breath. Patient not taking: Reported on 12/29/2014 03/26/14   Ambrose Finland, NP  amoxicillin (AMOXIL) 500 MG capsule Take 1 capsule (500 mg total) by mouth 3 (three) times daily. Patient not taking: Reported on 12/29/2014 04/18/14   Marlon Pel, PA-C  ibuprofen (ADVIL,MOTRIN) 200 MG  tablet Take 400 mg by mouth every 6 (six) hours as needed (toothache).     Historical Provider, MD  ibuprofen (ADVIL,MOTRIN) 800 MG tablet Take 1 tablet (800 mg total) by mouth every 8 (eight) hours as needed. Patient not taking: Reported on 12/29/2014 03/26/14   Ambrose Finland, NP  predniSONE (DELTASONE) 20 MG tablet Take 1 tablet (20 mg total) by mouth daily with breakfast. Patient not taking: Reported on 12/29/2014 03/26/14   Ambrose Finland, NP    Allergies  Oxycodone  Triage Vitals: BP 113/89 mmHg  Pulse 106  Temp(Src) 98.3 F (36.8 C) (Oral)  Resp 19  SpO2 100%  LMP 12/06/2014  Physical Exam  Constitutional: She is oriented to person, place, and time. She appears well-developed and well-nourished. No distress.  HENT:  Head: Normocephalic and atraumatic.  Nose: Nose normal.  Mouth/Throat: Oropharynx is clear and moist. No oropharyngeal exudate.  Eyes: Conjunctivae and EOM are normal. Pupils are equal, round, and reactive to light. No scleral icterus.  Neck: Normal range of motion. Neck supple. No JVD present. No tracheal deviation present. No thyromegaly present.  Cardiovascular: Normal rate, regular rhythm and normal heart sounds.  Exam reveals no gallop and no friction rub.   No murmur heard. Pulmonary/Chest: Effort normal. No respiratory distress. She has wheezes (intermittent). She exhibits no tenderness.  Abdominal: Soft. Bowel sounds are normal. She exhibits no distension and no mass. There is no tenderness. There is  no rebound and no guarding.  Musculoskeletal: Normal range of motion. She exhibits no edema or tenderness.  Lymphadenopathy:    She has no cervical adenopathy.  Neurological: She is alert and oriented to person, place, and time. No cranial nerve deficit. She exhibits normal muscle tone.  Skin: Skin is warm and dry. No rash noted. No erythema. No pallor.  Nursing note and vitals reviewed.   ED Course  Procedures   DIAGNOSTIC STUDIES: Oxygen Saturation is  100% on RA, normal by my interpretation.    COORDINATION OF CARE: 2:12 AM Discussed treatment plan which includes CXR, EKG, Prednisone and a breathing treatment with pt at bedside and pt agreed to plan.  Labs Review- Labs Reviewed - No data to display  Imaging Review Dg Chest 2 View  12/29/2014   CLINICAL DATA:  Subacute onset of shortness of breath and dizziness. Initial encounter.  EXAM: CHEST  2 VIEW  COMPARISON:  Chest radiograph performed 05/04/2013  FINDINGS: The lungs are well-aerated and clear. There is no evidence of focal opacification, pleural effusion or pneumothorax.  The heart is normal in size; the mediastinal contour is within normal limits. No acute osseous abnormalities are seen.  IMPRESSION: No acute cardiopulmonary process seen.   Electronically Signed   By: Roanna Raider M.D.   On: 12/29/2014 00:51   EKG Interpretation  Date/Time:    Ventricular Rate:    PR Interval:    QRS Duration:   QT Interval:    QTC Calculation:   R Axis:     Text Interpretation:       MDM   Final diagnoses:  None   Patient presents emergency department for shortness of breath, this is worse at night. She states this is consistent with her asthma. History is also consistent with asthma exacerbation or bronchospasm at night. I have a low suspicion for pulmonary embolism as she has no significant risk factors. Exam shows intermittent wheezing. She was given 1 DuoNeb treatment, as well as prednisone. We'll discharge home with prednisone course. She appears well and in no acute distress. Her vital signs remain within her normal limits and she is safe for discharge.    I personally performed the services described in this documentation, which was scribed in my presence. The recorded information has been reviewed and is accurate.     Tomasita Crumble, MD 12/29/14 [phone]

## 2015-02-18 ENCOUNTER — Emergency Department (HOSPITAL_COMMUNITY): Payer: Medicaid Other

## 2015-02-18 ENCOUNTER — Encounter (HOSPITAL_COMMUNITY): Payer: Self-pay | Admitting: *Deleted

## 2015-02-18 ENCOUNTER — Emergency Department (HOSPITAL_COMMUNITY)
Admission: EM | Admit: 2015-02-18 | Discharge: 2015-02-18 | Disposition: A | Discharge status: Home / Self Care | Payer: Medicaid Other | Attending: Emergency Medicine | Admitting: Emergency Medicine

## 2015-02-18 DIAGNOSIS — J45909 Unspecified asthma, uncomplicated: Secondary | ICD-10-CM | POA: Insufficient documentation

## 2015-02-18 DIAGNOSIS — Y9289 Other specified places as the place of occurrence of the external cause: Secondary | ICD-10-CM | POA: Insufficient documentation

## 2015-02-18 DIAGNOSIS — Z79899 Other long term (current) drug therapy: Secondary | ICD-10-CM | POA: Insufficient documentation

## 2015-02-18 DIAGNOSIS — Z87891 Personal history of nicotine dependence: Secondary | ICD-10-CM | POA: Insufficient documentation

## 2015-02-18 DIAGNOSIS — S46912A Strain of unspecified muscle, fascia and tendon at shoulder and upper arm level, left arm, initial encounter: Secondary | ICD-10-CM

## 2015-02-18 DIAGNOSIS — S46012A Strain of muscle(s) and tendon(s) of the rotator cuff of left shoulder, initial encounter: Secondary | ICD-10-CM | POA: Insufficient documentation

## 2015-02-18 DIAGNOSIS — Z7952 Long term (current) use of systemic steroids: Secondary | ICD-10-CM | POA: Insufficient documentation

## 2015-02-18 DIAGNOSIS — Y998 Other external cause status: Secondary | ICD-10-CM | POA: Insufficient documentation

## 2015-02-18 DIAGNOSIS — Y9389 Activity, other specified: Secondary | ICD-10-CM | POA: Insufficient documentation

## 2015-02-18 DIAGNOSIS — W1839XA Other fall on same level, initial encounter: Secondary | ICD-10-CM | POA: Insufficient documentation

## 2015-02-18 MED ORDER — NAPROXEN 500 MG PO TABS: 500.0000 mg | ORAL_TABLET | Freq: Two times a day (BID) | ORAL | Status: DC

## 2015-02-18 MED ORDER — TRAMADOL HCL 50 MG PO TABS: 50.0000 mg | ORAL_TABLET | Freq: Four times a day (QID) | ORAL | Status: DC | PRN

## 2015-02-18 NOTE — Discharge Instructions (Signed)
Naprosyn for pain and inflammation. Tramadol for severe pain. Ice. Rest. Sling as needed for comfort. Follow up with primary care doctor.   Shoulder Pain The shoulder is the joint that connects your arms to your body. The bones that form the shoulder joint include the upper arm bone (humerus), the shoulder blade (scapula), and the collarbone (clavicle). The top of the humerus is shaped like a ball and fits into a rather flat socket on the scapula (glenoid cavity). A combination of muscles and strong, fibrous tissues that connect muscles to bones (tendons) support your shoulder joint and hold the ball in the socket. Small, fluid-filled sacs (bursae) are located in different areas of the joint. They act as cushions between the bones and the overlying soft tissues and help reduce friction between the gliding tendons and the bone as you move your arm. Your shoulder joint allows a wide range of motion in your arm. This range of motion allows you to do things like scratch your back or throw a ball. However, this range of motion also makes your shoulder more prone to pain from overuse and injury. Causes of shoulder pain can originate from both injury and overuse and usually can be grouped in the following four categories:  Redness, swelling, and pain (inflammation) of the tendon (tendinitis) or the bursae (bursitis).  Instability, such as a dislocation of the joint.  Inflammation of the joint (arthritis).  Broken bone (fracture). HOME CARE INSTRUCTIONS   Apply ice to the sore area.  Put ice in a plastic bag.  Place a towel between your skin and the bag.  Leave the ice on for 15-20 minutes, 3-4 times per day for the first 2 days, or as directed by your health care provider.  Stop using cold packs if they do not help with the pain.  If you have a shoulder sling or immobilizer, wear it as long as your caregiver instructs. Only remove it to shower or bathe. Move your arm as little as possible, but keep  your hand moving to prevent swelling.  Squeeze a soft ball or foam pad as much as possible to help prevent swelling.  Only take over-the-counter or prescription medicines for pain, discomfort, or fever as directed by your caregiver. SEEK MEDICAL CARE IF:   Your shoulder pain increases, or new pain develops in your arm, hand, or fingers.  Your hand or fingers become cold and numb.  Your pain is not relieved with medicines. SEEK IMMEDIATE MEDICAL CARE IF:   Your arm, hand, or fingers are numb or tingling.  Your arm, hand, or fingers are significantly swollen or turn white or blue. MAKE SURE YOU:   Understand these instructions.  Will watch your condition.  Will get help right away if you are not doing well or get worse. Document Released: 02/11/2005 Document Revised: 09/18/2013 Document Reviewed: 04/18/2011 [facility] Patient Information 2015 [place], [place]. This information is not intended to replace advice given to you by your health care provider. Make sure you discuss any questions you have with your health care provider.

## 2015-02-18 NOTE — ED Provider Notes (Signed)
CSN: [id number]     Arrival date & time 02/18/15  1443 History  By signing my name below, I, Jarvis Morgan, attest that this documentation has been prepared under the direction and in the presence of Jaynie Crumble, PA-C Electronically Signed: Jarvis Morgan, ED Scribe. 02/18/2015. 3:13 PM.     Chief Complaint  Patient presents with  . Arm Pain   The history is provided by the patient. No language interpreter was used.    HPI Comments: Paula Wiggins is a 24 y.o. female with a h/o asthma who presents to the Emergency Department complaining of constant, moderate, left shoulder pain s/p fall from hoverboard yesterday. She states when she fell she landed on her left shoulder. Pt endorses that the pain radiates down into her left elbow. She reports that the pain is exacerbated by movement of her left shoulder and elbow. Pt took Ibuprofen with no relief. She denies any previous injury to the shoulder. Pt is right hand dominant. She denies any swelling, numbness or weakness.   Past Medical History  Diagnosis Date  . Asthma    History reviewed. No pertinent past surgical history. Family History  Problem Relation Age of Onset  . Hypertension Mother   . Seizures Father   . Hypertension Father    Social History  Substance Use Topics  . Smoking status: Former Games developer  . Smokeless tobacco: None  . Alcohol Use: Yes     Comment: occasionally   OB History    No data available     Review of Systems  Musculoskeletal: Positive for arthralgias. Negative for joint swelling.  Neurological: Negative for weakness and numbness.      Allergies  Oxycodone  Home Medications   Prior to Admission medications   Medication Sig Start Date End Date Taking? Authorizing Provider  albuterol (PROVENTIL HFA;VENTOLIN HFA) 108 (90 BASE) MCG/ACT inhaler Inhale 2 puffs into the lungs every 6 (six) hours as needed for wheezing or shortness of breath. Patient not taking: Reported on 12/29/2014 03/26/14    Ambrose Finland, NP  ibuprofen (ADVIL,MOTRIN) 200 MG tablet Take 400 mg by mouth every 6 (six) hours as needed (toothache).     Historical Provider, MD  ibuprofen (ADVIL,MOTRIN) 800 MG tablet Take 1 tablet (800 mg total) by mouth every 8 (eight) hours as needed. Patient not taking: Reported on 12/29/2014 03/26/14   Ambrose Finland, NP  predniSONE (DELTASONE) 20 MG tablet Take 3 tablets (60 mg total) by mouth daily. 12/29/14   Tomasita Crumble, MD   Triage Vitals: BP 134/87 mmHg  Pulse 100  Temp(Src) 98.3 F (36.8 C) (Oral)  Resp 18  SpO2 98%  LMP 01/30/2015  Physical Exam  Constitutional: She is oriented to person, place, and time. She appears well-developed and well-nourished. No distress.  HENT:  Head: Normocephalic and atraumatic.  Eyes: Conjunctivae and EOM are normal.  Neck: Neck supple. No tracheal deviation present.  Cardiovascular: Normal rate.   Pulmonary/Chest: Effort normal. No respiratory distress.  Musculoskeletal: Normal range of motion.       Left shoulder: She exhibits no swelling and no deformity.  No obvious swelling of deformity, however exam is limited due to body habitus. Tenderness to palpation over posterior anterior left shoulder. Pain with any range of motion of the shoulder joint. Tenderness to palpation over humerus, medial and lateral elbow joint. Patient has limited range of motion of the elbow due to pain. Normal wrist with full range of motion of the wrist. Normal hand. Grip strength  is 5 out of 5 and equal bilaterally. Distal radial pulses intact. Sensation intact in arm and hand.  Neurological: She is alert and oriented to person, place, and time.  Skin: Skin is warm and dry.  Psychiatric: She has a normal mood and affect. Her behavior is normal.  Nursing note and vitals reviewed.   ED Course  Procedures (including critical care time)  DIAGNOSTIC STUDIES: Oxygen Saturation is 98% on RA, normal by my interpretation.    COORDINATION OF CARE:  4:09 PM- will  order imaging of left elbow and left shoulder.  Pt advised of plan for treatment and pt agrees.    Labs Review Labs Reviewed - No data to display  Imaging Review Dg Elbow Complete Left  02/18/2015   CLINICAL DATA:  Stabbing pain in the left elbow, with numbness and tingling of the left hand.  EXAM: LEFT ELBOW - COMPLETE 3+ VIEW  COMPARISON:  None.  FINDINGS: There is no evidence of fracture, dislocation, or joint effusion. There is no evidence of arthropathy or other focal bone abnormality. Soft tissues are unremarkable.  IMPRESSION: No acute osseous abnormality identified.   Electronically Signed   By: Ted Mcalpine M.D.   On: 02/18/2015 15:55   Dg Shoulder Left  02/18/2015   CLINICAL DATA:  Left shoulder pain.  Accident one day prior.  EXAM: LEFT SHOULDER - 2+ VIEW  COMPARISON:  None.  FINDINGS: There is no evidence of fracture or dislocation. There is no evidence of arthropathy or other focal bone abnormality. Soft tissues are unremarkable.  IMPRESSION: Negative.   Electronically Signed   By: Delbert Phenix M.D.   On: 02/18/2015 15:55   I have personally reviewed and evaluated these images and lab results as part of my medical decision-making.   EKG Interpretation None      MDM   Final diagnoses:  Left shoulder strain, initial encounter     patient with left shoulder and elbow injury after falling off of hooverboard. Injury occurred yesterday. She is neurovascularly intact. X-rays of the shoulder and elbow are negative. Plan to placed in a sling, ice, rest, elevation. Follow-up with primary care doctor as needed. Most likely contusion versus sprain.  Filed Vitals:   02/18/15 1504 02/18/15 1628  BP: 134/87 123/63  Pulse: 100 96  Temp: 98.3 F (36.8 C) 98.4 F (36.9 C)  TempSrc: Oral Oral  Resp: 18 17  SpO2: 98% 100%        Jaynie Crumble, PA-C 02/18/15 1629  Arby Barrette, MD 02/28/15 1343

## 2015-02-18 NOTE — ED Notes (Signed)
Declined W/C at D/C and was escorted to lobby by RN. 

## 2015-02-18 NOTE — ED Notes (Signed)
Pt reports falling yesterday and still has left arm pain from elbow and into her shoulder, decreased rom. +radial pulse.

## 2015-06-12 ENCOUNTER — Emergency Department (HOSPITAL_COMMUNITY)
Admission: EM | Admit: 2015-06-12 | Discharge: 2015-06-12 | Disposition: A | Discharge status: Home / Self Care | Payer: Medicaid Other | Attending: Emergency Medicine | Admitting: Emergency Medicine

## 2015-06-12 ENCOUNTER — Encounter (HOSPITAL_COMMUNITY): Payer: Self-pay

## 2015-06-12 ENCOUNTER — Emergency Department (HOSPITAL_COMMUNITY): Payer: Medicaid Other

## 2015-06-12 DIAGNOSIS — Z87891 Personal history of nicotine dependence: Secondary | ICD-10-CM | POA: Insufficient documentation

## 2015-06-12 DIAGNOSIS — Z79899 Other long term (current) drug therapy: Secondary | ICD-10-CM | POA: Insufficient documentation

## 2015-06-12 DIAGNOSIS — E669 Obesity, unspecified: Secondary | ICD-10-CM | POA: Insufficient documentation

## 2015-06-12 DIAGNOSIS — J4531 Mild persistent asthma with (acute) exacerbation: Secondary | ICD-10-CM | POA: Insufficient documentation

## 2015-06-12 DIAGNOSIS — J4530 Mild persistent asthma, uncomplicated: Secondary | ICD-10-CM

## 2015-06-12 MED ORDER — PREDNISONE 20 MG PO TABS: 20.0000 mg | ORAL_TABLET | Freq: Every day | ORAL | Status: DC

## 2015-06-12 MED ORDER — ALBUTEROL SULFATE HFA 108 (90 BASE) MCG/ACT IN AERS: 1.0000 | INHALATION_SPRAY | Freq: Four times a day (QID) | RESPIRATORY_TRACT | Status: DC | PRN

## 2015-06-12 MED ORDER — ALBUTEROL SULFATE (2.5 MG/3ML) 0.083% IN NEBU: 2.5000 mg | INHALATION_SOLUTION | Freq: Four times a day (QID) | RESPIRATORY_TRACT | Status: DC | PRN

## 2015-06-12 MED ORDER — ALBUTEROL SULFATE HFA 108 (90 BASE) MCG/ACT IN AERS
1.0000 | INHALATION_SPRAY | Freq: Once | RESPIRATORY_TRACT | Status: AC
  Administered 2015-06-12: 2 via RESPIRATORY_TRACT
  Filled 2015-06-12: qty 6.7

## 2015-06-12 NOTE — ED Notes (Signed)
Pt with shortness of breath x months.  Pt has neb tx from her sister.  No inhaler.  Full sentences in triage.  No shortness of breath noted.  Vitals WNL

## 2015-06-12 NOTE — ED Provider Notes (Signed)
CSN: [id number]     Arrival date & time 06/12/15  1435 History   First MD Initiated Contact with Patient 06/12/15 1714     Chief Complaint  Patient presents with  . Shortness of Breath     (Consider location/radiation/quality/duration/timing/severity/associated sxs/prior Treatment) HPI...... patient claims to have a history of asthma. She said she has had a flareup for 1-2 months with wheezing. She is using a relative's nebulizer medication on a rare occasion. No fever, sweats, chills, productive sputum. Nothing makes symptoms better or worse. Severity is mild.  Past Medical History  Diagnosis Date  . Asthma    History reviewed. No pertinent past surgical history. Family History  Problem Relation Age of Onset  . Hypertension Mother   . Seizures Father   . Hypertension Father    Social History  Substance Use Topics  . Smoking status: Former Games developer  . Smokeless tobacco: Never Used  . Alcohol Use: Yes     Comment: occasionally   OB History    No data available     Review of Systems  All other systems reviewed and are negative.     Allergies  Oxycodone  Home Medications   Prior to Admission medications   Medication Sig Start Date End Date Taking? Authorizing Provider  ibuprofen (ADVIL,MOTRIN) 200 MG tablet Take 400 mg by mouth every 6 (six) hours as needed for moderate pain (chest pain).    Yes Historical Provider, MD  albuterol (PROVENTIL HFA;VENTOLIN HFA) 108 (90 Base) MCG/ACT inhaler Inhale 1-2 puffs into the lungs every 6 (six) hours as needed for wheezing or shortness of breath. 06/12/15   Donnetta Hutching, MD  albuterol (PROVENTIL) (2.5 MG/3ML) 0.083% nebulizer solution Take 3 mLs (2.5 mg total) by nebulization every 6 (six) hours as needed for wheezing or shortness of breath. 06/12/15   Donnetta Hutching, MD  ibuprofen (ADVIL,MOTRIN) 800 MG tablet Take 1 tablet (800 mg total) by mouth every 8 (eight) hours as needed. Patient not taking: Reported on 12/29/2014 03/26/14   Ambrose Finland, NP  naproxen (NAPROSYN) 500 MG tablet Take 1 tablet (500 mg total) by mouth 2 (two) times daily. Patient not taking: Reported on 06/12/2015 02/18/15   Lemont Fillers Kirichenko, PA-C  predniSONE (DELTASONE) 20 MG tablet Take 1 tablet (20 mg total) by mouth daily with breakfast. 2 tablets daily for 5 days, one tablet daily for 5 days 06/12/15   Donnetta Hutching, MD  traMADol (ULTRAM) 50 MG tablet Take 1 tablet (50 mg total) by mouth every 6 (six) hours as needed. Patient not taking: Reported on 06/12/2015 02/18/15   Tatyana Kirichenko, PA-C   BP 121/90 mmHg  Pulse 102  Temp(Src) 98.9 F (37.2 C) (Oral)  Resp 18  SpO2 100%  LMP 05/24/2015 Physical Exam  Constitutional: She is oriented to person, place, and time.  Obese, normal respirations, no acute distress  HENT:  Head: Normocephalic and atraumatic.  Eyes: Conjunctivae and EOM are normal. Pupils are equal, round, and reactive to light.  Neck: Normal range of motion. Neck supple.  Cardiovascular: Normal rate and regular rhythm.   Pulmonary/Chest: Effort normal.  No wheezing  Abdominal: Soft. Bowel sounds are normal.  Musculoskeletal: Normal range of motion.  Neurological: She is alert and oriented to person, place, and time.  Skin: Skin is warm and dry.  Psychiatric: She has a normal mood and affect. Her behavior is normal.  Nursing note and vitals reviewed.   ED Course  Procedures (including critical care time) Labs Review Labs  Reviewed - No data to display  Imaging Review Dg Chest 2 View  06/12/2015  CLINICAL DATA:  Chest pain and shortness of Breath for 1 week. EXAM: CHEST  2 VIEW COMPARISON:  12/29/2014 FINDINGS: The cardiac silhouette, mediastinal and hilar contours are within normal limits and stable. The lungs are clear. No pleural effusion. The bony thorax is intact. IMPRESSION: No acute cardiopulmonary findings.  No change since prior study. Electronically Signed   By: Rudie Meyer M.D.   On: 06/12/2015 15:10   I have personally  reviewed and evaluated these images and lab results as part of my medical decision-making.   EKG Interpretation None      MDM   Final diagnoses:  Reactive airway disease, mild persistent, uncomplicated    Patient is oxygenating at 100%. Lungs are clear. Will provider asthma medications and encourage primary care follow-up. Discharge medications albuterol inhaler, albuterol nebulizer solution, prednisone.    Donnetta Hutching, MD 06/12/15 [phone]

## 2015-06-12 NOTE — Discharge Instructions (Signed)
You must get a primary care doctor. Referral given. Prescriptions for albuterol inhaler and nebulizer solution and prednisone.

## 2015-08-24 ENCOUNTER — Encounter (HOSPITAL_COMMUNITY): Payer: Self-pay | Admitting: Emergency Medicine

## 2015-08-24 ENCOUNTER — Emergency Department (HOSPITAL_COMMUNITY)
Admission: EM | Admit: 2015-08-24 | Discharge: 2015-08-24 | Disposition: A | Discharge status: Home / Self Care | Payer: No Typology Code available for payment source | Attending: Emergency Medicine | Admitting: Emergency Medicine

## 2015-08-24 DIAGNOSIS — Z79899 Other long term (current) drug therapy: Secondary | ICD-10-CM | POA: Insufficient documentation

## 2015-08-24 DIAGNOSIS — J45909 Unspecified asthma, uncomplicated: Secondary | ICD-10-CM | POA: Insufficient documentation

## 2015-08-24 DIAGNOSIS — R591 Generalized enlarged lymph nodes: Secondary | ICD-10-CM

## 2015-08-24 DIAGNOSIS — K130 Diseases of lips: Secondary | ICD-10-CM | POA: Insufficient documentation

## 2015-08-24 DIAGNOSIS — R590 Localized enlarged lymph nodes: Secondary | ICD-10-CM | POA: Insufficient documentation

## 2015-08-24 DIAGNOSIS — Z87891 Personal history of nicotine dependence: Secondary | ICD-10-CM | POA: Insufficient documentation

## 2015-08-24 MED ORDER — SULFAMETHOXAZOLE-TRIMETHOPRIM 800-160 MG PO TABS: 1.0000 | ORAL_TABLET | Freq: Two times a day (BID) | ORAL | Status: AC

## 2015-08-24 MED ORDER — ALBUTEROL SULFATE HFA 108 (90 BASE) MCG/ACT IN AERS
2.0000 | INHALATION_SPRAY | RESPIRATORY_TRACT | Status: DC | PRN
  Administered 2015-08-24: 2 via RESPIRATORY_TRACT
  Filled 2015-08-24: qty 6.7

## 2015-08-24 NOTE — ED Notes (Signed)
Pt noticed a lump under her jaw this am. Pain with swallowing. Pt speaking in full sentences with no difficulty. No dental pain, sore throat or recent tongue piercings.

## 2015-08-24 NOTE — ED Provider Notes (Signed)
CSN: [id number]     Arrival date & time 08/24/15  1829 History  By signing my name below, I, Paula Wiggins, attest that this documentation has been prepared under the direction and in the presence of [employer] PA-C. Electronically Signed: Lorenza Wiggins, ED Scribe. 08/24/2015. 7:54 PM.     Chief Complaint  Patient presents with  . lump under jaw    The history is provided by the patient. No language interpreter was used.     HPI Comments: Paula Wiggins is a 25 y.o. female who presents to the Emergency Department complaining of lump under the chin that she noticed today. She states swallowing causes throat pain and also reports a cold sore to the lower lip. She is unaware of any pimples or abrasions to the chin area. She denies associated fever, chills, tongue swelling. She is a nonsmoker and does consume alcohol.   Past Medical History  Diagnosis Date  . Asthma    History reviewed. No pertinent past surgical history. Family History  Problem Relation Age of Onset  . Hypertension Mother   . Seizures Father   . Hypertension Father    Social History  Substance Use Topics  . Smoking status: Former Games developer  . Smokeless tobacco: Never Used  . Alcohol Use: Yes     Comment: occasionally   OB History    No data available     Review of Systems  Constitutional: Negative for fever and chills.  HENT: Positive for facial swelling (under chin).        -tongue swelling  All other systems reviewed and are negative.   Allergies  Oxycodone  Home Medications   Prior to Admission medications   Medication Sig Start Date End Date Taking? Authorizing Provider  albuterol (PROVENTIL HFA;VENTOLIN HFA) 108 (90 Base) MCG/ACT inhaler Inhale 1-2 puffs into the lungs every 6 (six) hours as needed for wheezing or shortness of breath. 06/12/15   Donnetta Hutching, MD  albuterol (PROVENTIL) (2.5 MG/3ML) 0.083% nebulizer solution Take 3 mLs (2.5 mg total) by nebulization every 6 (six) hours as needed for  wheezing or shortness of breath. 06/12/15   Donnetta Hutching, MD  ibuprofen (ADVIL,MOTRIN) 200 MG tablet Take 400 mg by mouth every 6 (six) hours as needed for moderate pain (chest pain).     Historical Provider, MD  ibuprofen (ADVIL,MOTRIN) 800 MG tablet Take 1 tablet (800 mg total) by mouth every 8 (eight) hours as needed. Patient not taking: Reported on 12/29/2014 03/26/14   Ambrose Finland, NP  naproxen (NAPROSYN) 500 MG tablet Take 1 tablet (500 mg total) by mouth 2 (two) times daily. Patient not taking: Reported on 06/12/2015 02/18/15   Tatyana Kirichenko, PA-C  predniSONE (DELTASONE) 20 MG tablet Take 1 tablet (20 mg total) by mouth daily with breakfast. 2 tablets daily for 5 days, one tablet daily for 5 days 06/12/15   Donnetta Hutching, MD  sulfamethoxazole-trimethoprim (BACTRIM DS,SEPTRA DS) 800-160 MG tablet Take 1 tablet by mouth 2 (two) times daily. 08/24/15 08/31/15  Elson Areas, PA-C  traMADol (ULTRAM) 50 MG tablet Take 1 tablet (50 mg total) by mouth every 6 (six) hours as needed. Patient not taking: Reported on 06/12/2015 02/18/15   Tatyana Kirichenko, PA-C   BP 113/80 mmHg  Pulse 99  Temp(Src) 98 F (36.7 C) (Oral)  Resp 16  SpO2 100% Physical Exam  Constitutional: She is oriented to person, place, and time. She appears well-developed and well-nourished.  HENT:  Head: Normocephalic and atraumatic.  Mouth/Throat:  Oropharynx is clear and moist. No oropharyngeal exudate.  Small superficial ulcer to the right lower lip. No swelling under tongue. Submental lymphadenopathy noted.   Cardiovascular: Normal rate, regular rhythm and normal heart sounds.   Pulmonary/Chest: Effort normal and breath sounds normal.  Lymphadenopathy:    She has no cervical adenopathy.  Neurological: She is alert and oriented to person, place, and time.  Skin: Skin is warm and dry.  Psychiatric: She has a normal mood and affect.  Nursing note and vitals reviewed.   ED Course  Procedures (including critical care  time)  DIAGNOSTIC STUDIES: Oxygen Saturation is 100% on RA, normal by my interpretation.    COORDINATION OF CARE: 7:35 PM- Will treat with abx and advised to apply warm compresses. Discussed treatment plan with pt at bedside and pt agreed to plan.    Labs Review Labs Reviewed - No data to display  Imaging Review No results found.    EKG Interpretation None      MDM   Final diagnoses:  Lip ulcer  Lymphadenopathy   Suspect lymphadenopathy secondary to ulcer right lip. Ulcer seems to be resolving. Will treat lymphadenopathy with Bactrim DS. Patient advised to return if symptoms are unchanged or worse.    I personally performed the services in this documentation, which was scribed in my presence.  The recorded information has been reviewed and considered.   Barnet PallKaren SofiaPAC.  Lonia SkinnerLeslie K AlmaSofia, PA-C 08/24/15 2031  Lorre NickAnthony Allen, MD 08/28/15 [phone]

## 2015-08-24 NOTE — Discharge Instructions (Signed)
Lymphadenopathy Lymphadenopathy refers to swollen or enlarged lymph glands, also called lymph nodes. Lymph glands are part of your body's defense (immune) system, which protects the body from infections, germs, and diseases. Lymph glands are found in many locations in your body, including the neck, underarm, and groin.  Many things can cause lymph glands to become enlarged. When your immune system responds to germs, such as viruses or bacteria, infection-fighting cells and fluid build up. This causes the glands to grow in size. Usually, this is not something to worry about. The swelling and any soreness often go away without treatment. However, swollen lymph glands can also be caused by a number of diseases. Your health care provider may do various tests to help determine the cause. If the cause of your swollen lymph glands cannot be found, it is important to monitor your condition to make sure the swelling goes away. HOME CARE INSTRUCTIONS Watch your condition for any changes. The following actions may help to lessen any discomfort you are feeling:  Get plenty of rest.  Take medicines only as directed by your health care provider. Your health care provider may recommend over-the-counter medicines for pain.  Apply moist heat compresses to the site of swollen lymph nodes as directed by your health care provider. This can help reduce any pain.  Check your lymph nodes daily for any changes.  Keep all follow-up visits as directed by your health care provider. This is important. SEEK MEDICAL CARE IF:  Your lymph nodes are still swollen after 2 weeks.  Your swelling increases or spreads to other areas.  Your lymph nodes are hard, seem fixed to the skin, or are growing rapidly.  Your skin over the lymph nodes is red and inflamed.  You have a fever.  You have chills.  You have fatigue.  You develop a sore throat.  You have abdominal pain.  You have weight loss.  You have night  sweats. SEEK IMMEDIATE MEDICAL CARE IF:  You notice fluid leaking from the area of the enlarged lymph node.  You have severe pain in any area of your body.  You have chest pain.  You have shortness of breath.   This information is not intended to replace advice given to you by your health care provider. Make sure you discuss any questions you have with your health care provider.   Document Released: 02/11/2008 Document Revised: 05/25/2014 Document Reviewed: 12/07/2013 Elsevier Interactive Patient Education 2016 Elsevier Inc. Oral Ulcers Oral ulcers are painful, shallow sores around the lining of the mouth. They can affect the gums, the inside of the lips, and the cheeks. (Sores on the outside of the lips and on the face are different.) They typically first occur in school-aged children and teenagers. Oral ulcers may also be called canker sores or cold sores. CAUSES  Canker sores and cold sores can be caused by many factors including:  Infection.  Injury.  Sun exposure.  Medications.  Emotional stress.  Food allergies.  Vitamin deficiencies.  Toothpastes containing sodium lauryl sulfate. The herpes virus can be the cause of mouth ulcers. The first infection can be severe and cause 10 or more ulcers on the gums, tongue, and lips with fever and difficulty in swallowing. This infection usually occurs between the ages of 1 and 3 years.  SYMPTOMS  The typical sore is about  inch (6 mm) in size and is an oval or round ulcer with red borders. DIAGNOSIS  Your caregiver can diagnose simple oral ulcers by  examination. Additional testing is usually not required.  TREATMENT  Treatment is aimed at pain relief. Generally, oral ulcers resolve by themselves within 1 to 2 weeks without medication and are not contagious unless caused by herpes (and other viruses). Antibiotics are not effective with mouth sores. Avoid direct contact with others until the ulcer is completely healed. See your  caregiver for follow-up care as recommended. Also:  Offer a soft diet.  Encourage plenty of fluids to prevent dehydration. Popsicles and milk shakes can be helpful.  Avoid acidic and salty foods and drinks such as orange juice.  Infants and young children will often refuse to drink because of pain. Using a teaspoon, cup, or syringe to give small amounts of fluids frequently can help prevent dehydration.  Cold compresses on the face may help reduce pain.  Pain medication can help control soreness.  A solution of diphenhydramine mixed with a liquid antacid can be useful to decrease the soreness of ulcers. Consult a caregiver for the dosing.  Liquids or ointments with a numbing ingredient may be helpful when used as recommended.  Older children and teenagers can rinse their mouth with a salt-water mixture (1/2 teaspoon of salt in 8 ounces of water) four times a day. This treatment is uncomfortable but may reduce the time the ulcers are present.  There are many over-the-counter throat lozenges and medications available for oral ulcers. Their effectiveness has not been studied.  Consult your medical caregiver prior to using homeopathic treatments for oral ulcers. SEEK MEDICAL CARE IF:   You think your child needs to be seen.  The pain worsens and you cannot control it.  There are 4 or more ulcers.  The lips and gums begin to bleed and crust.  A single mouth ulcer is near a tooth that is causing a toothache or pain.  Your child has a fever, swollen face, or swollen glands.  The ulcers began after starting a medication.  Mouth ulcers keep reoccurring or last more than 2 weeks.  You think your child is not taking adequate fluids. SEEK IMMEDIATE MEDICAL CARE IF:   Your child has a high fever.  Your child is unable to swallow or becomes dehydrated.  Your child looks or acts very ill.  An ulcer caused by a chemical your child accidentally put in their mouth.   This  information is not intended to replace advice given to you by your health care provider. Make sure you discuss any questions you have with your health care provider.   Document Released: 06/11/2004 Document Revised: 05/25/2014 Document Reviewed: 09/19/2014 Elsevier Interactive Patient Education [employer] Elsevier Inc.

## 2015-09-02 ENCOUNTER — Emergency Department (HOSPITAL_COMMUNITY)
Admission: EM | Admit: 2015-09-02 | Discharge: 2015-09-02 | Disposition: A | Discharge status: Home / Self Care | Payer: No Typology Code available for payment source | Attending: Emergency Medicine | Admitting: Emergency Medicine

## 2015-09-02 ENCOUNTER — Emergency Department (HOSPITAL_COMMUNITY): Payer: No Typology Code available for payment source

## 2015-09-02 ENCOUNTER — Encounter (HOSPITAL_COMMUNITY): Payer: Self-pay | Admitting: Emergency Medicine

## 2015-09-02 DIAGNOSIS — S6992XA Unspecified injury of left wrist, hand and finger(s), initial encounter: Secondary | ICD-10-CM | POA: Insufficient documentation

## 2015-09-02 DIAGNOSIS — Z79899 Other long term (current) drug therapy: Secondary | ICD-10-CM | POA: Insufficient documentation

## 2015-09-02 DIAGNOSIS — Y998 Other external cause status: Secondary | ICD-10-CM | POA: Insufficient documentation

## 2015-09-02 DIAGNOSIS — Y9389 Activity, other specified: Secondary | ICD-10-CM | POA: Insufficient documentation

## 2015-09-02 DIAGNOSIS — J45909 Unspecified asthma, uncomplicated: Secondary | ICD-10-CM | POA: Insufficient documentation

## 2015-09-02 DIAGNOSIS — Z87891 Personal history of nicotine dependence: Secondary | ICD-10-CM | POA: Insufficient documentation

## 2015-09-02 DIAGNOSIS — Y9289 Other specified places as the place of occurrence of the external cause: Secondary | ICD-10-CM | POA: Insufficient documentation

## 2015-09-02 DIAGNOSIS — W231XXA Caught, crushed, jammed, or pinched between stationary objects, initial encounter: Secondary | ICD-10-CM | POA: Insufficient documentation

## 2015-09-02 NOTE — ED Provider Notes (Signed)
CSN: [id number]     Arrival date & time 09/02/15  1555 History  By signing my name below, I, Placido SouLogan Joldersma, attest that this documentation has been prepared under the direction and in the presence of Newell RubbermaidJeffrey Pranay Hilbun, PA-C. Electronically Signed: Placido SouLogan Joldersma, ED Scribe. 09/02/2015. 4:56 PM.   Chief Complaint  Patient presents with  . Finger Injury   The history is provided by the patient. No language interpreter was used.    HPI Comments: Paula Wiggins is a 25 y.o. female who presents to the Emergency Department complaining of constant, moderate, sudden onset, left middle finger pain x 1 day. Pt reports she accidentally slammed the affected finger in a car door resulting in her current pain. She reports associated, mild, bruising of the affected finger, numbness of the affected finger although she still reports pain, and a radiation of pain up her hand to her left wrist. Her pain worsens with movement. She applied ice and attempted to splint the finger at home without relief. She denies any other associated symptoms at this time.   Past Medical History  Diagnosis Date  . Asthma    History reviewed. No pertinent past surgical history. Family History  Problem Relation Age of Onset  . Hypertension Mother   . Seizures Father   . Hypertension Father    Social History  Substance Use Topics  . Smoking status: Former Games developermoker  . Smokeless tobacco: Never Used  . Alcohol Use: Yes     Comment: occasionally   OB History    No data available     Review of Systems A complete 10 system review of systems was obtained and all systems are negative except as noted in the HPI and PMH.   Allergies  Oxycodone  Home Medications   Prior to Admission medications   Medication Sig Start Date End Date Taking? Authorizing Provider  albuterol (PROVENTIL HFA;VENTOLIN HFA) 108 (90 Base) MCG/ACT inhaler Inhale 1-2 puffs into the lungs every 6 (six) hours as needed for wheezing or shortness of breath.  06/12/15   Donnetta HutchingBrian Cook, MD  albuterol (PROVENTIL) (2.5 MG/3ML) 0.083% nebulizer solution Take 3 mLs (2.5 mg total) by nebulization every 6 (six) hours as needed for wheezing or shortness of breath. 06/12/15   Donnetta HutchingBrian Cook, MD  ibuprofen (ADVIL,MOTRIN) 200 MG tablet Take 400 mg by mouth every 6 (six) hours as needed for moderate pain (chest pain).     Historical Provider, MD  ibuprofen (ADVIL,MOTRIN) 800 MG tablet Take 1 tablet (800 mg total) by mouth every 8 (eight) hours as needed. Patient not taking: Reported on 12/29/2014 03/26/14   Ambrose FinlandValerie A Keck, NP  naproxen (NAPROSYN) 500 MG tablet Take 1 tablet (500 mg total) by mouth 2 (two) times daily. Patient not taking: Reported on 06/12/2015 02/18/15   Lemont Fillersatyana Kirichenko, PA-C  predniSONE (DELTASONE) 20 MG tablet Take 1 tablet (20 mg total) by mouth daily with breakfast. 2 tablets daily for 5 days, one tablet daily for 5 days 06/12/15   Donnetta HutchingBrian Cook, MD  traMADol (ULTRAM) 50 MG tablet Take 1 tablet (50 mg total) by mouth every 6 (six) hours as needed. Patient not taking: Reported on 06/12/2015 02/18/15   Tatyana Kirichenko, PA-C   BP 117/78 mmHg  Pulse 78  Temp(Src) 98.3 F (36.8 C) (Oral)  Resp 18  SpO2 99%  LMP 08/17/2015 (Exact Date)    Physical Exam  Constitutional: She is oriented to person, place, and time. She appears well-developed and well-nourished.  HENT:  Head: Normocephalic  and atraumatic.  Eyes: EOM are normal.  Neck: Normal range of motion.  Cardiovascular: Normal rate.   Pulmonary/Chest: Effort normal. No respiratory distress.  Abdominal: Soft.  Musculoskeletal: Normal range of motion.  Minor swelling to the third left digit, no soft tissue injury for active range of motion. Decrease light sensation to entire finger  Neurological: She is alert and oriented to person, place, and time.  Skin: Skin is warm and dry.  Psychiatric: She has a normal mood and affect.  Nursing note and vitals reviewed.   ED Course  Procedures  DIAGNOSTIC  STUDIES: Oxygen Saturation is 99% on RA, normal by my interpretation.    COORDINATION OF CARE: 4:53 PM Discussed next steps with pt. She verbalized understanding and is agreeable with the plan.   Labs Review Labs Reviewed - No data to display  Imaging Review Dg Finger Middle Left  09/02/2015  CLINICAL DATA:  25 year old female with left middle finger pain post trauma. Initial encounter. EXAM: LEFT MIDDLE FINGER 2+V COMPARISON:  None. FINDINGS: No fracture or dislocation. No malalignment. IMPRESSION: No fracture or dislocation. Electronically Signed   By: Lacy Duverney M.D.   On: 09/02/2015 16:33   I have personally reviewed and evaluated these images as part of my medical decision-making.   EKG Interpretation None      MDM   Final diagnoses:  Finger injury, left, initial encounter    Labs: none indicated  Imaging: DG left middle finger  Consults: none  Therapeutics:   Assessment: Finger pain, no signs of significant trauma. Patient is able to move the finger in all directions without difficulty.   Plan: Pt given strict return precautions, verbalized understanding and agreement to today's plan and had no further questions or concerns at the time of discharge.    I personally performed the services described in this documentation, which was scribed in my presence. The recorded information has been reviewed and is accurate.    Eyvonne Mechanic, PA-C 09/02/15 2014  Lorre Nick, MD 09/06/15 [phone]
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
## 2015-11-09 ENCOUNTER — Encounter (HOSPITAL_COMMUNITY): Payer: Self-pay | Admitting: Emergency Medicine

## 2015-11-09 ENCOUNTER — Emergency Department (HOSPITAL_COMMUNITY): Payer: No Typology Code available for payment source

## 2015-11-09 ENCOUNTER — Emergency Department (HOSPITAL_COMMUNITY)
Admission: EM | Admit: 2015-11-09 | Discharge: 2015-11-09 | Disposition: A | Discharge status: Home / Self Care | Payer: No Typology Code available for payment source | Attending: Emergency Medicine | Admitting: Emergency Medicine

## 2015-11-09 DIAGNOSIS — Z7951 Long term (current) use of inhaled steroids: Secondary | ICD-10-CM | POA: Insufficient documentation

## 2015-11-09 DIAGNOSIS — Z7289 Other problems related to lifestyle: Secondary | ICD-10-CM

## 2015-11-09 DIAGNOSIS — Z87891 Personal history of nicotine dependence: Secondary | ICD-10-CM | POA: Insufficient documentation

## 2015-11-09 DIAGNOSIS — F1210 Cannabis abuse, uncomplicated: Secondary | ICD-10-CM | POA: Insufficient documentation

## 2015-11-09 DIAGNOSIS — F10129 Alcohol abuse with intoxication, unspecified: Secondary | ICD-10-CM | POA: Insufficient documentation

## 2015-11-09 DIAGNOSIS — J45909 Unspecified asthma, uncomplicated: Secondary | ICD-10-CM | POA: Insufficient documentation

## 2015-11-09 DIAGNOSIS — F1090 Alcohol use, unspecified, uncomplicated: Secondary | ICD-10-CM

## 2015-11-09 DIAGNOSIS — R4020 Unspecified coma: Secondary | ICD-10-CM | POA: Insufficient documentation

## 2015-11-09 LAB — RAPID URINE DRUG SCREEN, HOSP PERFORMED
Amphetamines: NOT DETECTED
BARBITURATES: NOT DETECTED
Benzodiazepines: NOT DETECTED
COCAINE: NOT DETECTED
Opiates: NOT DETECTED
TETRAHYDROCANNABINOL: POSITIVE — AB

## 2015-11-09 LAB — I-STAT CHEM 8, ED
BUN: 11 mg/dL (ref 6–20)
Calcium, Ion: 1.16 mmol/L (ref 1.12–1.23)
Chloride: 105 mmol/L (ref 101–111)
Creatinine, Ser: 0.9 mg/dL (ref 0.44–1.00)
Glucose, Bld: 126 mg/dL — ABNORMAL HIGH (ref 65–99)
HEMATOCRIT: 40 % (ref 36.0–46.0)
HEMOGLOBIN: 13.6 g/dL (ref 12.0–15.0)
POTASSIUM: 3.3 mmol/L — AB (ref 3.5–5.1)
SODIUM: 142 mmol/L (ref 135–145)
TCO2: 22 mmol/L (ref 0–100)

## 2015-11-09 LAB — CBC WITH DIFFERENTIAL/PLATELET
BASOS ABS: 0 10*3/uL (ref 0.0–0.1)
BASOS PCT: 0 %
EOS ABS: 0.2 10*3/uL (ref 0.0–0.7)
Eosinophils Relative: 2 %
HEMATOCRIT: 39 % (ref 36.0–46.0)
HEMOGLOBIN: 13 g/dL (ref 12.0–15.0)
Lymphocytes Relative: 29 %
Lymphs Abs: 4.5 10*3/uL — ABNORMAL HIGH (ref 0.7–4.0)
MCH: 31.6 pg (ref 26.0–34.0)
MCHC: 33.3 g/dL (ref 30.0–36.0)
MCV: 94.9 fL (ref 78.0–100.0)
Monocytes Absolute: 0.6 10*3/uL (ref 0.1–1.0)
Monocytes Relative: 4 %
NEUTROS ABS: 10.1 10*3/uL — AB (ref 1.7–7.7)
NEUTROS PCT: 65 %
Platelets: 414 10*3/uL — ABNORMAL HIGH (ref 150–400)
RBC: 4.11 MIL/uL (ref 3.87–5.11)
RDW: 12.8 % (ref 11.5–15.5)
WBC: 15.4 10*3/uL — AB (ref 4.0–10.5)

## 2015-11-09 LAB — COMPREHENSIVE METABOLIC PANEL
ALBUMIN: 4.3 g/dL (ref 3.5–5.0)
ALK PHOS: 77 U/L (ref 38–126)
ALT: 12 U/L — AB (ref 14–54)
ANION GAP: 10 (ref 5–15)
AST: 17 U/L (ref 15–41)
BUN: 13 mg/dL (ref 6–20)
CALCIUM: 9 mg/dL (ref 8.9–10.3)
CO2: 22 mmol/L (ref 22–32)
Chloride: 107 mmol/L (ref 101–111)
Creatinine, Ser: 0.78 mg/dL (ref 0.44–1.00)
GFR calc Af Amer: >60 mL/min (ref >60)
GFR calc non Af Amer: >60 mL/min (ref >60)
GLUCOSE: 126 mg/dL — AB (ref 65–99)
Potassium: 3.4 mmol/L — ABNORMAL LOW (ref 3.5–5.1)
SODIUM: 139 mmol/L (ref 135–145)
Total Bilirubin: 0.5 mg/dL (ref 0.3–1.2)
Total Protein: 8.1 g/dL (ref 6.5–8.1)

## 2015-11-09 LAB — ETHANOL: Alcohol, Ethyl (B): 88 mg/dL — ABNORMAL HIGH (ref <5)

## 2015-11-09 LAB — ACETAMINOPHEN LEVEL: Acetaminophen (Tylenol), Serum: <10 ug/mL — ABNORMAL LOW (ref 10–30)
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
## 2015-11-09 NOTE — ED Notes (Addendum)
Pt awake and ambulated to restroom without difficulty. Pt family bedside and pt eating/drinking without difficulty.
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
## 2015-11-09 NOTE — Discharge Instructions (Signed)
Polysubstance Abuse °When people abuse more than one drug or type of drug it is called polysubstance or polydrug abuse. For example, many smokers also drink alcohol. This is one form of polydrug abuse. Polydrug abuse also refers to the use of a drug to counteract an unpleasant effect produced by another drug. It may also be used to help with withdrawal from another drug. People who take stimulants may become agitated. Sometimes this agitation is countered with a tranquilizer. This helps protect against the unpleasant side effects. Polydrug abuse also refers to the use of different drugs at the same time.  °Anytime drug use is interfering with normal living activities, it has become abuse. This includes problems with family and friends. Psychological dependence has developed when your mind tells you that the drug is needed. This is usually followed by physical dependence which has developed when continuing increases of drug are required to get the same feeling or "high". This is known as addiction or chemical dependency. A person's risk is much higher if there is a history of chemical dependency in the family. °SIGNS OF CHEMICAL DEPENDENCY °· You have been told by friends or family that drugs have become a problem. °· You fight when using drugs. °· You are having blackouts (not remembering what you do while using). °· You feel sick from using drugs but continue using. °· You lie about use or amounts of drugs (chemicals) used. °· You need chemicals to get you going. °· You are suffering in work performance or in school because of drug use. °· You get sick from use of drugs but continue to use anyway. °· You need drugs to relate to people or feel comfortable in social situations. °· You use drugs to forget problems. °"Yes" answered to any of the above signs of chemical dependency indicates there are problems. The longer the use of drugs continues, the greater the problems will become. °If there is a family history of  drug or alcohol use, it is best not to experiment with these drugs. Continual use leads to tolerance. After tolerance develops more of the drug is needed to get the same feeling. This is followed by addiction. With addiction, drugs become the most important part of life. It becomes more important to take drugs than participate in the other usual activities of life. This includes relating to friends and family. Addiction is followed by dependency. Dependency is a condition where drugs are now needed not just to get high, but to feel normal. °Addiction cannot be cured but it can be stopped. This often requires outside help and the care of professionals. Treatment centers are listed in the yellow pages under: Cocaine, Narcotics, and Alcoholics Anonymous. Most hospitals and clinics can refer you to a specialized care center. Talk to your caregiver if you need help. °  °This information is not intended to replace advice given to you by your health care provider. Make sure you discuss any questions you have with your health care provider. °  °Document Released: 12/24/2004 Document Revised: 07/27/2011 Document Reviewed: 05/09/2014 °Elsevier Interactive Patient Education ©2016 Else[employer] Inc. ° °

## 2015-11-09 NOTE — ED Notes (Signed)
Pt family at bedside. Pt responsive to painful stimuli at this time. Currently awaiting lab results and further orders for any treatment.

## 2015-11-09 NOTE — ED Notes (Signed)
Pt family reports understanding of discharge information. No questions at time of discharge 

## 2015-11-09 NOTE — ED Notes (Signed)
[facility] Expected date:  Expected time:  Means of arrival:  Comments: [facility]

## 2015-11-15 ENCOUNTER — Emergency Department (HOSPITAL_COMMUNITY): Payer: No Typology Code available for payment source

## 2015-11-15 ENCOUNTER — Encounter (HOSPITAL_COMMUNITY): Payer: Self-pay

## 2015-11-15 ENCOUNTER — Emergency Department (HOSPITAL_COMMUNITY)
Admission: EM | Admit: 2015-11-15 | Discharge: 2015-11-15 | Disposition: A | Discharge status: Home / Self Care | Payer: No Typology Code available for payment source | Attending: Emergency Medicine | Admitting: Emergency Medicine

## 2015-11-15 DIAGNOSIS — R072 Precordial pain: Secondary | ICD-10-CM | POA: Insufficient documentation

## 2015-11-15 DIAGNOSIS — Z791 Long term (current) use of non-steroidal anti-inflammatories (NSAID): Secondary | ICD-10-CM | POA: Insufficient documentation

## 2015-11-15 DIAGNOSIS — Z87891 Personal history of nicotine dependence: Secondary | ICD-10-CM | POA: Insufficient documentation

## 2015-11-15 DIAGNOSIS — Y939 Activity, unspecified: Secondary | ICD-10-CM | POA: Insufficient documentation

## 2015-11-15 DIAGNOSIS — M898X1 Other specified disorders of bone, shoulder: Secondary | ICD-10-CM | POA: Insufficient documentation

## 2015-11-15 DIAGNOSIS — Z79899 Other long term (current) drug therapy: Secondary | ICD-10-CM | POA: Diagnosis not present

## 2015-11-15 DIAGNOSIS — Y999 Unspecified external cause status: Secondary | ICD-10-CM | POA: Insufficient documentation

## 2015-11-15 DIAGNOSIS — Y929 Unspecified place or not applicable: Secondary | ICD-10-CM | POA: Insufficient documentation

## 2015-11-15 DIAGNOSIS — M25511 Pain in right shoulder: Secondary | ICD-10-CM

## 2015-11-15 DIAGNOSIS — R0789 Other chest pain: Secondary | ICD-10-CM

## 2015-11-15 DIAGNOSIS — J45909 Unspecified asthma, uncomplicated: Secondary | ICD-10-CM | POA: Insufficient documentation

## 2015-11-15 MED ORDER — PREDNISONE 20 MG PO TABS: 20.0000 mg | ORAL_TABLET | Freq: Every day | ORAL | Status: DC

## 2015-11-15 MED ORDER — NAPROXEN 500 MG PO TABS: 500.0000 mg | ORAL_TABLET | Freq: Two times a day (BID) | ORAL | Status: DC

## 2015-11-15 NOTE — Discharge Instructions (Signed)
Musculoskeletal Pain Musculoskeletal pain is muscle and boney aches and pains. These pains can occur in any part of the body. Your caregiver may treat you without knowing the cause of the pain. They may treat you if blood or urine tests, X-rays, and other tests were normal.  CAUSES There is often not a definite cause or reason for these pains. These pains may be caused by a type of germ (virus). The discomfort may also come from overuse. Overuse includes working out too hard when your body is not fit. Boney aches also come from weather changes. Bone is sensitive to atmospheric pressure changes. HOME CARE INSTRUCTIONS   Ask when your test results will be ready. Make sure you get your test results.  Only take over-the-counter or prescription medicines for pain, discomfort, or fever as directed by your caregiver. If you were given medications for your condition, do not drive, operate machinery or power tools, or sign legal documents for 24 hours. Do not drink alcohol. Do not take sleeping pills or other medications that may interfere with treatment.  Continue all activities unless the activities cause more pain. When the pain lessens, slowly resume normal activities. Gradually increase the intensity and duration of the activities or exercise.  During periods of severe pain, bed rest may be helpful. Lay or sit in any position that is comfortable.  Putting ice on the injured area.  Put ice in a bag.  Place a towel between your skin and the bag.  Leave the ice on for 15 to 20 minutes, 3 to 4 times a day.  Follow up with your caregiver for continued problems and no reason can be found for the pain. If the pain becomes worse or does not go away, it may be necessary to repeat tests or do additional testing. Your caregiver may need to look further for a possible cause. SEEK IMMEDIATE MEDICAL CARE IF:  You have pain that is getting worse and is not relieved by medications.  You develop chest pain  that is associated with shortness or breath, sweating, feeling sick to your stomach (nauseous), or throw up (vomit).  Your pain becomes localized to the abdomen.  You develop any new symptoms that seem different or that concern you. MAKE SURE YOU:   Understand these instructions.  Will watch your condition.  Will get help right away if you are not doing well or get worse.   This information is not intended to replace advice given to you by your health care provider. Make sure you discuss any questions you have with your health care provider.  Follow-up with Dr.Onasanya with neurology if your symptoms do not improve. Apply ice to affected area. Take prednisone and Naprosyn as needed for pain and inflammation. Continue using home breathing treatments as usual. Return to the emergency department a few experience loss of consciousness, weakness in an extremity, severe headache, neck pain or stiffness, chest pain or shortness of breath.
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
## 2015-12-19 ENCOUNTER — Emergency Department (HOSPITAL_COMMUNITY)
Admission: EM | Admit: 2015-12-19 | Discharge: 2015-12-19 | Disposition: A | Discharge status: Home / Self Care | Payer: Medicaid Other | Attending: Emergency Medicine | Admitting: Emergency Medicine

## 2015-12-19 ENCOUNTER — Emergency Department (HOSPITAL_COMMUNITY): Payer: Medicaid Other

## 2015-12-19 ENCOUNTER — Encounter (HOSPITAL_COMMUNITY): Payer: Self-pay | Admitting: Emergency Medicine

## 2015-12-19 DIAGNOSIS — Y999 Unspecified external cause status: Secondary | ICD-10-CM | POA: Insufficient documentation

## 2015-12-19 DIAGNOSIS — S9002XA Contusion of left ankle, initial encounter: Secondary | ICD-10-CM | POA: Insufficient documentation

## 2015-12-19 DIAGNOSIS — Y939 Activity, unspecified: Secondary | ICD-10-CM | POA: Insufficient documentation

## 2015-12-19 DIAGNOSIS — Z791 Long term (current) use of non-steroidal anti-inflammatories (NSAID): Secondary | ICD-10-CM | POA: Insufficient documentation

## 2015-12-19 DIAGNOSIS — W1809XA Striking against other object with subsequent fall, initial encounter: Secondary | ICD-10-CM | POA: Insufficient documentation

## 2015-12-19 DIAGNOSIS — J45909 Unspecified asthma, uncomplicated: Secondary | ICD-10-CM | POA: Insufficient documentation

## 2015-12-19 DIAGNOSIS — Z87891 Personal history of nicotine dependence: Secondary | ICD-10-CM | POA: Insufficient documentation

## 2015-12-19 DIAGNOSIS — Y929 Unspecified place or not applicable: Secondary | ICD-10-CM | POA: Insufficient documentation

## 2015-12-19 MED ORDER — IBUPROFEN 600 MG PO TABS: 600.0000 mg | ORAL_TABLET | Freq: Four times a day (QID) | ORAL | 0 refills | Status: DC | PRN

## 2015-12-19 MED ORDER — NAPROXEN 500 MG PO TABS
500.0000 mg | ORAL_TABLET | Freq: Once | ORAL | Status: AC
  Administered 2015-12-19: 500 mg via ORAL
  Filled 2015-12-19: qty 1
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
## 2015-12-19 NOTE — Discharge Instructions (Signed)
Please read and follow all provided instructions.  Your diagnoses today include:  1. Ankle contusion, left, initial encounter     Tests performed today include: An x-ray of the affected area - does NOT show any broken bones Vital signs. See below for your results today.   Medications prescribed:  Ibuprofen (Motrin, Advil) - anti-inflammatory pain medication Do not exceed 600mg  ibuprofen every 6 hours, take with food  You have been prescribed an anti-inflammatory medication or NSAID. Take with food. Take smallest effective dose for the shortest duration needed for your pain. Stop taking if you experience stomach pain or vomiting.   Take any prescribed medications only as directed.  Home care instructions:  Follow any educational materials contained in this packet Follow R.I.C.E. Protocol: R - rest your injury  I  - use ice on injury without applying directly to skin C - compress injury with bandage or splint E - elevate the injury as much as possible  Follow-up instructions: Please follow-up with your primary care provider or the provided orthopedic physician (bone specialist) if you continue to have significant pain in 1 week. In this case you may have a more severe injury that requires further care.   Return instructions:  Please return if your toes or feet are numb or tingling, appear gray or blue, or you have severe pain (also elevate the leg and loosen splint or wrap if you were given one) Please return to the Emergency Department if you experience worsening symptoms.  Please return if you have any other emergent concerns.  Additional Information:  Your vital signs today were: BP 133/75    Pulse 96    Temp 98.3 F (36.8 C) (Oral)    Resp 17    Ht 5\' 3"  (1.6 m)    Wt 99.8 kg    LMP 11/26/2015    SpO2 99%    BMI 38.97 kg/m  If your blood pressure (BP) was elevated above 135/85 this visit, please have this repeated by your doctor within one month. --------------
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
## 2016-01-09 ENCOUNTER — Encounter (HOSPITAL_COMMUNITY): Payer: Self-pay | Admitting: Emergency Medicine

## 2016-01-09 ENCOUNTER — Emergency Department (HOSPITAL_COMMUNITY)
Admission: EM | Admit: 2016-01-09 | Discharge: 2016-01-09 | Discharge status: Against Medical Advice | Payer: Medicaid Other | Attending: Emergency Medicine | Admitting: Emergency Medicine

## 2016-01-09 ENCOUNTER — Emergency Department (HOSPITAL_COMMUNITY): Payer: Medicaid Other

## 2016-01-09 DIAGNOSIS — R079 Chest pain, unspecified: Secondary | ICD-10-CM

## 2016-01-09 DIAGNOSIS — Z791 Long term (current) use of non-steroidal anti-inflammatories (NSAID): Secondary | ICD-10-CM | POA: Insufficient documentation

## 2016-01-09 DIAGNOSIS — J45909 Unspecified asthma, uncomplicated: Secondary | ICD-10-CM | POA: Insufficient documentation

## 2016-01-09 DIAGNOSIS — R0789 Other chest pain: Secondary | ICD-10-CM

## 2016-01-09 DIAGNOSIS — Z87891 Personal history of nicotine dependence: Secondary | ICD-10-CM | POA: Insufficient documentation

## 2016-01-09 LAB — BASIC METABOLIC PANEL
ANION GAP: 5 (ref 5–15)
BUN: 8 mg/dL (ref 6–20)
CALCIUM: 9 mg/dL (ref 8.9–10.3)
CO2: 26 mmol/L (ref 22–32)
Chloride: 107 mmol/L (ref 101–111)
Creatinine, Ser: 0.81 mg/dL (ref 0.44–1.00)
GLUCOSE: 91 mg/dL (ref 65–99)
POTASSIUM: 3.9 mmol/L (ref 3.5–5.1)
SODIUM: 138 mmol/L (ref 135–145)

## 2016-01-09 LAB — CBC
HEMATOCRIT: 38.7 % (ref 36.0–46.0)
HEMOGLOBIN: 12.9 g/dL (ref 12.0–15.0)
MCH: 32 pg (ref 26.0–34.0)
MCHC: 33.3 g/dL (ref 30.0–36.0)
MCV: 96 fL (ref 78.0–100.0)
Platelets: 336 10*3/uL (ref 150–400)
RBC: 4.03 MIL/uL (ref 3.87–5.11)
RDW: 12.8 % (ref 11.5–15.5)
WBC: 11.8 10*3/uL — AB (ref 4.0–10.5)

## 2016-01-09 LAB — I-STAT TROPONIN, ED: TROPONIN I, POC: 0 ng/mL (ref 0.00–0.08)

## 2016-01-09 NOTE — ED Notes (Signed)
Patient left without being seen.

## 2016-01-09 NOTE — ED Triage Notes (Signed)
Patient c/o central chest pain that is worse when coughs. Patient adds that the chest pain is causing her to have blurred vision.  Symptoms started yesterday.

## 2016-01-09 NOTE — ED Provider Notes (Signed)
[facility]-EMERGENCY DEPT Provider Note   CSN: [id number] Arrival date & time: 01/09/16  1710  By signing my name below, I, Paula Wiggins, attest that this documentation has been prepared under the direction and in the presence of [name], PA-C. Electronically Signed: Alyssa GroveMartin Wiggins, ED Scribe. 01/09/16. 8:57 PM.  History   Chief Complaint Chief Complaint  Patient presents with  . Chest Pain  . Blurred Vision   The history is provided by the patient. No language interpreter was used.    HPI Comments: Paula Wiggins is a 25 y.o. female with PMHx of Asthma who presents to the Emergency Department complaining of gradual onset, constant, sharp, central chest pain onset yesterday. Pt describes chest pain as a 9/10 sharp pain. Pt reports associated positional blurred vision. She states that her chest pain is exacerbated with coughing, standing, and movement. She states she was at rest when she first experienced her chest pain. Pt states pain has been constant since 4 PM yesterday. Pt states her vision became blurry at work. She states she stood up to get out of her car when her vision became blurry and she fell back down into her seat. Pt states intermittent blurry vision exacerbated when her chest pain is worse. Pt states she has used albuterol with no relief to chest pain. Pt denies recent travel or recent surgeries. She denies use of birth control. Pt denies hx of DVT/PE. Pt states she does experience bilateral leg swelling occasionally. She denies recent heavy lifting or trauma to chest. Pt denies nausea, vomiting, double vision, loss of vision, "bands" in her vision, pain with eye movement.   Past Medical History:  Diagnosis Date  . Asthma     There are no active problems to display for this patient.   History reviewed. No pertinent surgical history.  OB History    No data available       Home Medications    Prior to Admission medications   Medication Sig Start Date End Date Taking?  Authorizing Provider  albuterol (PROVENTIL HFA;VENTOLIN HFA) 108 (90 Base) MCG/ACT inhaler Inhale 1-2 puffs into the lungs every 6 (six) hours as needed for wheezing or shortness of breath. 06/12/15   Donnetta HutchingBrian Cook, MD  albuterol (PROVENTIL) (2.5 MG/3ML) 0.083% nebulizer solution Take 3 mLs (2.5 mg total) by nebulization every 6 (six) hours as needed for wheezing or shortness of breath. 06/12/15   Donnetta HutchingBrian Cook, MD  ibuprofen (ADVIL,MOTRIN) 600 MG tablet Take 1 tablet (600 mg total) by mouth every 6 (six) hours as needed. 12/19/15   Renne CriglerJoshua Geiple, PA-C  naproxen (NAPROSYN) 500 MG tablet Take 1 tablet (500 mg total) by mouth 2 (two) times daily. 11/15/15   Samantha Tripp Dowless, PA-C  predniSONE (DELTASONE) 20 MG tablet Take 1 tablet (20 mg total) by mouth daily with breakfast. 2 tablets daily for 5 days, one tablet daily for 5 days 11/15/15   Dub MikesSamantha Tripp Dowless, PA-C    Family History Family History  Problem Relation Age of Onset  . Hypertension Mother   . Seizures Father   . Hypertension Father     Social History Social History  Substance Use Topics  . Smoking status: Former Games developermoker  . Smokeless tobacco: Never Used  . Alcohol use Yes     Comment: occasionally     Allergies   Oxycodone   Review of Systems Review of Systems 10 Systems reviewed and are negative for acute change except as noted in the HPI.   Physical Exam Updated  Vital Signs BP 110/77 (BP Location: Left Arm)   Pulse 97   Temp 98.6 F (37 C) (Oral)   Resp 20   LMP 12/23/2015   SpO2 100%   Physical Exam  Constitutional: She appears well-developed and well-nourished.  NAD Laughing during exam  HENT:  Head: Normocephalic.  Eyes: Conjunctivae are normal.  Cardiovascular: Normal rate, regular rhythm and normal heart sounds.   Pulmonary/Chest: Effort normal. No respiratory distress. She has no wheezes. She has no rales. She exhibits tenderness.  Tenderness to sternum  Abdominal: She exhibits no distension.  No  epigastric tenderness  Musculoskeletal: Normal range of motion.  Neurological: She is alert.  Skin: Skin is warm and dry.  Psychiatric: She has a normal mood and affect. Her behavior is normal.  Nursing note and vitals reviewed.  ED Treatments / Results  DIAGNOSTIC STUDIES: Oxygen Saturation is 100% on RA, normal by my interpretation.    COORDINATION OF CARE: 8:51 PM Discussed treatment plan with pt at bedside which includes I-Stat Troponin and pt agreed to plan.  Labs (all labs ordered are listed, but only abnormal results are displayed) Labs Reviewed  CBC - Abnormal; Notable for the following:       Result Value   WBC 11.8 (*)    All other components within normal limits  BASIC METABOLIC PANEL  I-STAT TROPOININ, ED    EKG  EKG Interpretation None       Radiology Dg Chest 2 View  Result Date: 01/09/2016 CLINICAL DATA:  Patient c/o central chest pain that is worse when coughs. Patient adds that the chest pain is causing her to have blurred vision. Symptoms started yesterday. EXAM: CHEST  2 VIEW COMPARISON:  11/15/2015 FINDINGS: The heart size and mediastinal contours are within normal limits. Both lungs are clear. No pleural effusion or pneumothorax. The visualized skeletal structures are unremarkable. IMPRESSION: No active cardiopulmonary disease. Electronically Signed   By: Amie Portland M.D.   On: 01/09/2016 18:39    Procedures Procedures (including critical care time)  Medications Ordered in ED Medications - No data to display   Initial Impression / Assessment and Plan / ED Course  I have reviewed the triage vital signs and the nursing notes.  Pertinent labs & imaging results that were available during my care of the patient were reviewed by me and considered in my medical decision making (see chart for details).  Clinical Course    Final Clinical Impressions(s) / ED Diagnoses  I have reviewed and evaluated the relevant laboratory valuesI have reviewed and  evaluated the relevant imaging studies. I have interpreted the relevant EKG.I have reviewed the relevant previous healthcare records.I obtained HPI from historian. Patient discussed with supervising physician  ED Course:  Assessment: Pt is a 25yF presents with CP yesterday. At rest. Risk Factors none. Patient is to be discharged with recommendation to follow up with PCP in regards to today's hospital visit. Chest pain is not likely of cardiac or pulmonary etiology d/t presentation, perc negative, VSS, no tracheal deviation, no JVD or new murmur, RRR, breath sounds equal bilaterally, EKG without acute abnormalities, negative troponin, and negative CXR. Relief of symptoms in ED. Pt has been advised start a NSAIDs and return to the ED is CP becomes exertional, associated with diaphoresis or nausea, radiates to left jaw/arm, worsens or becomes concerning in any way. Pt appears reliable for follow up and is agreeable to discharge. Patient is in no acute distress. Vital Signs are stable. Patient is able to  ambulate. Patient able to tolerate PO.   Disposition/Plan:  DC Home Additional Verbal discharge instructions given and discussed with patient.  Pt Instructed to f/u with PCP in the next week for evaluation and treatment of symptoms. Return precautions given Pt acknowledges and agrees with plan  Supervising Physician Tilden Fossa, MD   Final diagnoses:  Chest pain, unspecified chest pain type  Chest wall pain    New Prescriptions New Prescriptions   No medications on file   I personally performed the services described in this documentation, which was scribed in my presence. The recorded information has been reviewed and is accurate.     Audry Pili, PA-C 01/10/16 1610    Tilden Fossa, MD 01/11/16 [phone]

## 2016-05-05 ENCOUNTER — Emergency Department (HOSPITAL_COMMUNITY): Payer: Medicaid Other

## 2016-05-05 ENCOUNTER — Encounter (HOSPITAL_COMMUNITY): Payer: Self-pay | Admitting: Emergency Medicine

## 2016-05-05 ENCOUNTER — Emergency Department (HOSPITAL_COMMUNITY)
Admission: EM | Admit: 2016-05-05 | Discharge: 2016-05-05 | Disposition: A | Discharge status: Home / Self Care | Payer: Medicaid Other | Attending: Emergency Medicine | Admitting: Emergency Medicine

## 2016-05-05 DIAGNOSIS — Y9301 Activity, walking, marching and hiking: Secondary | ICD-10-CM | POA: Insufficient documentation

## 2016-05-05 DIAGNOSIS — S93401A Sprain of unspecified ligament of right ankle, initial encounter: Secondary | ICD-10-CM | POA: Insufficient documentation

## 2016-05-05 DIAGNOSIS — J45909 Unspecified asthma, uncomplicated: Secondary | ICD-10-CM | POA: Insufficient documentation

## 2016-05-05 DIAGNOSIS — W109XXA Fall (on) (from) unspecified stairs and steps, initial encounter: Secondary | ICD-10-CM | POA: Insufficient documentation

## 2016-05-05 DIAGNOSIS — Z87891 Personal history of nicotine dependence: Secondary | ICD-10-CM | POA: Insufficient documentation

## 2016-05-05 DIAGNOSIS — Y999 Unspecified external cause status: Secondary | ICD-10-CM | POA: Insufficient documentation

## 2016-05-05 DIAGNOSIS — Y929 Unspecified place or not applicable: Secondary | ICD-10-CM | POA: Insufficient documentation

## 2016-05-05 MED ORDER — KETOROLAC TROMETHAMINE 60 MG/2ML IM SOLN
60.0000 mg | Freq: Once | INTRAMUSCULAR | Status: AC
  Administered 2016-05-05: 60 mg via INTRAMUSCULAR
  Filled 2016-05-05: qty 2

## 2016-05-05 NOTE — Discharge Instructions (Signed)
There were no abnormalities noted on the x-ray, which means you have likely sustained a sprain. Use the Ace wrap for support and comfort. Elevate the extremity whenever possible. Use the crutches to avoid bearing weight and to allow the injury to heal. Apply ice to reduce inflammation. Ibuprofen and naproxen to reduce pain and inflammation. Follow up with a primary care provider for further management of this issue.

## 2016-05-05 NOTE — ED Provider Notes (Signed)
[facility]ER[facility] DEPT Provider Note   CSN: [id number] Arrival date & time: 05/05/16  1200  By signing my name below, I, Paula Wiggins, attest that this documentation has been prepared under the direction and in the presence of Paula Briley, PA-C. Electronically Signed: Sonum Wiggins, Neurosurgeoncribe. 05/05/16. 12:18 PM.  History   Chief Complaint Chief Complaint  Patient presents with  . Ankle Pain   The history is provided by the patient. No language interpreter was used.     HPI Comments: Paula Wiggins is a 25 y.o. female who presents to the Emergency Department complaining of a right ankle and foot injury that occurred yesterday. Patient states she was walking down steps when she tripped and fell onto outstretched hands causing her right foot to become "flipped" underneath her. She reports constant, moderate, throbbing, unchanged pain to the right foot and ankle. She has tried Aleve with minimal relief. She denies head, neck, or back injury during the fall. She denies any neuro deficits and has no other complaints at this time.  Past Medical History:  Diagnosis Date  . Asthma     There are no active problems to display for this patient.   History reviewed. No pertinent surgical history.  OB History    No data available       Home Medications    Prior to Admission medications   Medication Sig Start Date End Date Taking? Authorizing Provider  albuterol (PROVENTIL HFA;VENTOLIN HFA) 108 (90 Base) MCG/ACT inhaler Inhale 1-2 puffs into the lungs every 6 (six) hours as needed for wheezing or shortness of breath. 06/12/15   Donnetta HutchingBrian Cook, MD  albuterol (PROVENTIL) (2.5 MG/3ML) 0.083% nebulizer solution Take 3 mLs (2.5 mg total) by nebulization every 6 (six) hours as needed for wheezing or shortness of breath. 06/12/15   Donnetta HutchingBrian Cook, MD  ibuprofen (ADVIL,MOTRIN) 600 MG tablet Take 1 tablet (600 mg total) by mouth every 6 (six) hours as needed. Patient taking differently: Take 600 mg by mouth every 6  (six) hours as needed for moderate pain.  12/19/15   Renne CriglerJoshua Geiple, PA-C  naproxen (NAPROSYN) 500 MG tablet Take 1 tablet (500 mg total) by mouth 2 (two) times daily. Patient not taking: Reported on 01/09/2016 11/15/15   Lester KinsmanSamantha Tripp Dowless, PA-C  predniSONE (DELTASONE) 20 MG tablet Take 1 tablet (20 mg total) by mouth daily with breakfast. 2 tablets daily for 5 days, one tablet daily for 5 days Patient not taking: Reported on 01/09/2016 11/15/15   Dub MikesSamantha Tripp Dowless, PA-C    Family History Family History  Problem Relation Age of Onset  . Hypertension Mother   . Seizures Father   . Hypertension Father     Social History Social History  Substance Use Topics  . Smoking status: Former Games developermoker  . Smokeless tobacco: Never Used  . Alcohol use Yes     Comment: occasionally     Allergies   Oxycodone   Review of Systems Review of Systems  Musculoskeletal: Positive for arthralgias. Negative for back pain and neck pain.  Neurological: Negative for syncope, weakness, numbness and headaches.     Physical Exam Updated Vital Signs BP 109/79   Pulse 106   Temp 97.6 F (36.4 C) (Oral)   Resp 16   SpO2 94%   Physical Exam  Constitutional: She appears well-developed and well-nourished. No distress.  HENT:  Head: Normocephalic and atraumatic.  Eyes: Conjunctivae are normal.  Neck: Neck supple.  Cardiovascular: Normal rate, regular rhythm and intact distal pulses.  Pulmonary/Chest: Effort normal.  Musculoskeletal: She exhibits tenderness.  Tenderness to dorsum of right foot as well as over right medial malleolus. Motor function intact in right ankle and toes. Distal pulses intact.   Neurological: She is alert. No sensory deficit.  No sensory deficits. Strength in the right ankle and toes 5 out of 5.  Skin: Skin is warm and dry. Capillary refill takes less than 2 seconds. She is not diaphoretic.  Psychiatric: She has a normal mood and affect. Her behavior is normal.  Nursing  note and vitals reviewed.    ED Treatments / Results  DIAGNOSTIC STUDIES: Oxygen Saturation is 94% on RA, adequate by my interpretation.    COORDINATION OF CARE: 12:19 PM Will order Xray. Discussed treatment plan with pt at bedside and pt agreed to plan.   Labs (all labs ordered are listed, but only abnormal results are displayed) Labs Reviewed - No data to display  EKG  EKG Interpretation None       Radiology Dg Ankle Complete Right  Result Date: 05/05/2016 CLINICAL DATA:  Paula Wiggins down the stairs yesterday.  Ankle pain. EXAM: RIGHT ANKLE - COMPLETE 3+ VIEW COMPARISON:  11/11/2010 FINDINGS: There is no evidence of fracture, dislocation, or joint effusion. There is no evidence of arthropathy or other focal bone abnormality. Soft tissues are unremarkable. IMPRESSION: Negative. Electronically Signed   By: Paulina FusiMark  Shogry M.D.   On: 05/05/2016 12:43   Dg Foot Complete Right  Result Date: 05/05/2016 CLINICAL DATA:  Paula Wiggins down the stairs yesterday foot and ankle pain. EXAM: RIGHT FOOT COMPLETE - 3+ VIEW COMPARISON:  None. FINDINGS: There is no evidence of fracture or dislocation. There is no evidence of arthropathy or other focal bone abnormality. Soft tissues are unremarkable. IMPRESSION: Negative. Electronically Signed   By: Paulina FusiMark  Shogry M.D.   On: 05/05/2016 12:42    Procedures Procedures (including critical care time)  Medications Ordered in ED Medications  ketorolac (TORADOL) injection 60 mg (60 mg Intramuscular Given 05/05/16 1241)     Initial Impression / Assessment and Plan / ED Course  I have reviewed the triage vital signs and the nursing notes.  Pertinent labs & imaging results that were available during my care of the patient were reviewed by me and considered in my medical decision making (see chart for details).  Clinical Course     Patient presents with a right ankle and foot injury following a fall yesterday. No acute abnormalities on x-ray. PCP follow-up. Home  care and return precautions discussed.    Final Clinical Impressions(s) / ED Diagnoses   Final diagnoses:  Sprain of right ankle, unspecified ligament, initial encounter    New Prescriptions New Prescriptions   No medications on file   I personally performed the services described in this documentation, which was scribed in my presence. The recorded information has been reviewed and is accurate.    Anselm PancoastShawn C Jersey Ravenscroft, PA-C 05/05/16 1251    Geoffery Lyonsouglas Delo, MD 05/05/16 [phone]

## 2016-05-05 NOTE — ED Triage Notes (Signed)
Pt twisted R ankle when walking down the steps yesterday. Pt has pain on top of foot and on ankle. Pt ambulatory. No bruising or deformity noted.

## 2016-07-15 ENCOUNTER — Emergency Department (HOSPITAL_COMMUNITY)
Admission: EM | Admit: 2016-07-15 | Discharge: 2016-07-15 | Disposition: A | Discharge status: Home / Self Care | Payer: Medicaid Other | Attending: Emergency Medicine | Admitting: Emergency Medicine

## 2016-07-15 ENCOUNTER — Encounter (HOSPITAL_COMMUNITY): Payer: Self-pay

## 2016-07-15 ENCOUNTER — Emergency Department (HOSPITAL_COMMUNITY): Payer: Medicaid Other

## 2016-07-15 DIAGNOSIS — J069 Acute upper respiratory infection, unspecified: Secondary | ICD-10-CM | POA: Insufficient documentation

## 2016-07-15 DIAGNOSIS — Z87891 Personal history of nicotine dependence: Secondary | ICD-10-CM | POA: Insufficient documentation

## 2016-07-15 DIAGNOSIS — B9789 Other viral agents as the cause of diseases classified elsewhere: Secondary | ICD-10-CM

## 2016-07-15 DIAGNOSIS — Z79899 Other long term (current) drug therapy: Secondary | ICD-10-CM | POA: Insufficient documentation

## 2016-07-15 DIAGNOSIS — J45901 Unspecified asthma with (acute) exacerbation: Secondary | ICD-10-CM | POA: Insufficient documentation

## 2016-07-15 MED ORDER — ALBUTEROL SULFATE HFA 108 (90 BASE) MCG/ACT IN AERS: 1.0000 | INHALATION_SPRAY | Freq: Four times a day (QID) | RESPIRATORY_TRACT | 0 refills | Status: DC | PRN

## 2016-07-15 MED ORDER — PREDNISONE 20 MG PO TABS: 40.0000 mg | ORAL_TABLET | Freq: Every day | ORAL | 0 refills | Status: DC

## 2016-07-15 MED ORDER — ALBUTEROL SULFATE (2.5 MG/3ML) 0.083% IN NEBU: 2.5000 mg | INHALATION_SOLUTION | Freq: Four times a day (QID) | RESPIRATORY_TRACT | 1 refills | Status: DC | PRN

## 2016-07-15 MED ORDER — ALBUTEROL SULFATE (2.5 MG/3ML) 0.083% IN NEBU
5.0000 mg | INHALATION_SOLUTION | Freq: Once | RESPIRATORY_TRACT | Status: AC
  Administered 2016-07-15: 5 mg via RESPIRATORY_TRACT
  Filled 2016-07-15: qty 6

## 2016-07-15 MED ORDER — PREDNISONE 20 MG PO TABS
60.0000 mg | ORAL_TABLET | Freq: Once | ORAL | Status: AC
  Administered 2016-07-15: 60 mg via ORAL
  Filled 2016-07-15: qty 3

## 2016-07-15 MED ORDER — FLUTICASONE PROPIONATE 50 MCG/ACT NA SUSP: 2.0000 | Freq: Every day | NASAL | 0 refills | Status: DC

## 2016-07-15 MED ORDER — NAPROXEN 250 MG PO TABS: 250.0000 mg | ORAL_TABLET | Freq: Two times a day (BID) | ORAL | 0 refills | Status: DC

## 2016-07-15 MED ORDER — IPRATROPIUM BROMIDE 0.02 % IN SOLN
0.5000 mg | Freq: Once | RESPIRATORY_TRACT | Status: AC
  Administered 2016-07-15: 0.5 mg via RESPIRATORY_TRACT
  Filled 2016-07-15: qty 2.5

## 2016-07-15 MED ORDER — ACETAMINOPHEN 325 MG PO TABS
650.0000 mg | ORAL_TABLET | Freq: Once | ORAL | Status: AC
  Administered 2016-07-15: 650 mg via ORAL
  Filled 2016-07-15: qty 2

## 2016-07-15 MED ORDER — ALBUTEROL SULFATE HFA 108 (90 BASE) MCG/ACT IN AERS
2.0000 | INHALATION_SPRAY | Freq: Once | RESPIRATORY_TRACT | Status: AC
  Administered 2016-07-15: 2 via RESPIRATORY_TRACT
  Filled 2016-07-15: qty 6.7

## 2016-07-15 MED ORDER — BENZONATATE 100 MG PO CAPS: 100.0000 mg | ORAL_CAPSULE | Freq: Three times a day (TID) | ORAL | 0 refills | Status: DC | PRN

## 2016-07-15 NOTE — ED Provider Notes (Signed)
[facility]-EMERGENCY DEPT Provider Note   CSN: [id number] Arrival date & time: 07/15/16  1240     History   Chief Complaint Chief Complaint  Patient presents with  . Asthma  . Nasal Congestion  . Cough  . Sore Throat    HPI Paula Wiggins is a 26 y.o. female.  Paula Wiggins is a 26 y.o. Female with a history of asthma who presents to the emergency department complaining of cough and congestion for the past 3 weeks it has worsened over the past week. Patient reports for the past week she's had wheezing, chest tightness, shortness of breath, coughing, nasal congestion, postnasal drip, ear pressure and sneezing. She also reports a fever last night of 103. She reports that out of her albuterol inhaler for the past 3 weeks and believes this is contributed to her symptoms. She did not receive her flu shot this year. She denies hemoptysis, abdominal pain, nausea, vomiting, diarrhea, urinary symptoms, lightheadedness, syncope, or rashes.    The history is provided by the patient and medical records. No language interpreter was used.  Asthma  Associated symptoms include shortness of breath. Pertinent negatives include no chest pain, no abdominal pain and no headaches.  Cough  Associated symptoms include chills, ear pain, rhinorrhea, sore throat, shortness of breath and wheezing. Pertinent negatives include no chest pain and no headaches. Her past medical history is significant for asthma.  Sore Throat  Associated symptoms include shortness of breath. Pertinent negatives include no chest pain, no abdominal pain and no headaches.    Past Medical History:  Diagnosis Date  . Asthma     There are no active problems to display for this patient.   History reviewed. No pertinent surgical history.  OB History    No data available       Home Medications    Prior to Admission medications   Medication Sig Start Date End Date Taking? Authorizing Provider  Phenyleph-Doxylamine-DM-APAP  (NYQUIL SEVERE COLD/FLU) 5-6.25-10-325 MG/15ML LIQD Take 30 mLs by mouth once.   Yes Historical Provider, MD  albuterol (PROVENTIL HFA;VENTOLIN HFA) 108 (90 Base) MCG/ACT inhaler Inhale 1-2 puffs into the lungs every 6 (six) hours as needed for wheezing or shortness of breath. 07/15/16   Everlene Farrier, PA-C  albuterol (PROVENTIL) (2.5 MG/3ML) 0.083% nebulizer solution Take 3 mLs (2.5 mg total) by nebulization every 6 (six) hours as needed for wheezing or shortness of breath. 07/15/16   Everlene Farrier, PA-C  benzonatate (TESSALON) 100 MG capsule Take 1 capsule (100 mg total) by mouth 3 (three) times daily as needed for cough. 07/15/16   Everlene Farrier, PA-C  fluticasone (FLONASE) 50 MCG/ACT nasal spray Place 2 sprays into both nostrils daily. 07/15/16   Everlene Farrier, PA-C  naproxen (NAPROSYN) 250 MG tablet Take 1 tablet (250 mg total) by mouth 2 (two) times daily with a meal. 07/15/16   Everlene Farrier, PA-C  predniSONE (DELTASONE) 20 MG tablet Take 2 tablets (40 mg total) by mouth daily. 07/15/16   Everlene Farrier, PA-C    Family History Family History  Problem Relation Age of Onset  . Hypertension Mother   . Seizures Father   . Hypertension Father     Social History Social History  Substance Use Topics  . Smoking status: Former Games developer  . Smokeless tobacco: Never Used  . Alcohol use Yes     Comment: occasionally     Allergies   Oxycodone   Review of Systems Review of Systems  Constitutional: Positive for chills and  fever.  HENT: Positive for congestion, ear pain, postnasal drip, rhinorrhea, sneezing and sore throat. Negative for trouble swallowing.   Eyes: Negative for visual disturbance.  Respiratory: Positive for cough, chest tightness, shortness of breath and wheezing.   Cardiovascular: Negative for chest pain, palpitations and leg swelling.  Gastrointestinal: Negative for abdominal pain, diarrhea, nausea and vomiting.  Genitourinary: Negative for dysuria.  Musculoskeletal:  Negative for back pain, neck pain and neck stiffness.  Skin: Negative for rash.  Neurological: Negative for headaches.     Physical Exam Updated Vital Signs BP 106/64 (BP Location: Left Arm)   Pulse 107   Temp 97.8 F (36.6 C)   Resp 20   Ht 5\' 3"  (1.6 m)   Wt 104.3 kg   LMP 07/10/2016   SpO2 100%   BMI 40.74 kg/m   Physical Exam  Constitutional: She appears well-developed and well-nourished. No distress.  Nontoxic appearing.  HENT:  Head: Normocephalic and atraumatic.  Right Ear: External ear normal.  Left Ear: External ear normal.  Mouth/Throat: Oropharynx is clear and moist. No oropharyngeal exudate.  Mild clear middle ear effusion noted bilaterally without any erythema or loss of landmarks. Rhinorrhea and boggy nasal turbinates bilaterally. No tonsillar hypertrophy or exudates. Uvula is midline without edema.  Eyes: Conjunctivae are normal. Pupils are equal, round, and reactive to light. Right eye exhibits no discharge. Left eye exhibits no discharge.  Neck: Normal range of motion. Neck supple. No JVD present. No tracheal deviation present.  Cardiovascular: Normal rate, regular rhythm, normal heart sounds and intact distal pulses.  Exam reveals no gallop and no friction rub.   No murmur heard. HR is 92  Pulmonary/Chest: Effort normal. No stridor. No respiratory distress. She has wheezes. She has no rales.  Diminished lung sounds bilaterally. Slight wheezes noted bilaterally. No increased work of breathing.   Abdominal: Soft. There is no tenderness. There is no guarding.  Musculoskeletal: She exhibits no edema or tenderness.  Lymphadenopathy:    She has no cervical adenopathy.  Neurological: She is alert. Coordination normal.  Skin: Skin is warm and dry. Capillary refill takes less than 2 seconds. No rash noted. She is not diaphoretic. No erythema. No pallor.  Psychiatric: She has a normal mood and affect. Her behavior is normal.  Nursing note and vitals  reviewed.    ED Treatments / Results  Labs (all labs ordered are listed, but only abnormal results are displayed) Labs Reviewed - No data to display  EKG  EKG Interpretation  Date/Time:  Wednesday July 15 2016 12:47:44 EST Ventricular Rate:  113 PR Interval:    QRS Duration: 74 QT Interval:  314 QTC Calculation: 431 R Axis:   80 Text Interpretation:  Sinus tachycardia Borderline T abnormalities, anterior leads Baseline wander in lead(s) II III aVF Since last tracing rate faster Confirmed by KNAPP  MD-J, JON ([id number]) on 07/15/2016 12:55:27 PM       Radiology Dg Chest 2 View  Result Date: 07/15/2016 CLINICAL DATA:  Delays for the past 2 weeks with 3 weeks of productive cough and shortness of breath. History of asthma. EXAM: CHEST  2 VIEW COMPARISON:  PA and lateral chest x-ray of January 09, 2016 FINDINGS: The lungs are adequately inflated and clear. The heart and pulmonary vascularity are normal. The mediastinum is normal in width. There is no pleural effusion. The trachea is midline. The bony thorax exhibits no acute abnormality. IMPRESSION: There is no pneumonia nor other acute cardiopulmonary abnormality. Electronically Signed   By:  David  SwazilandJordan M.D.   On: 07/15/2016 14:14    Procedures Procedures (including critical care time)  Medications Ordered in ED Medications  albuterol (PROVENTIL HFA;VENTOLIN HFA) 108 (90 Base) MCG/ACT inhaler 2 puff (not administered)  predniSONE (DELTASONE) tablet 60 mg (not administered)  albuterol (PROVENTIL) (2.5 MG/3ML) 0.083% nebulizer solution 5 mg (5 mg Nebulization Given 07/15/16 1338)  ipratropium (ATROVENT) nebulizer solution 0.5 mg (0.5 mg Nebulization Given 07/15/16 1339)  acetaminophen (TYLENOL) tablet 650 mg (650 mg Oral Given 07/15/16 1334)     Initial Impression / Assessment and Plan / ED Course  I have reviewed the triage vital signs and the nursing notes.  Pertinent labs & imaging results that were available during my care of  the patient were reviewed by me and considered in my medical decision making (see chart for details).    This is a 26 y.o. Female with a history of asthma who presents to the emergency department complaining of cough and congestion for the past 3 weeks it has worsened over the past week. Patient reports for the past week she's had wheezing, chest tightness, shortness of breath, coughing, nasal congestion, postnasal drip, ear pressure and sneezing. She also reports a fever last night of 103. She reports that out of her albuterol inhaler for the past 3 weeks and believes this is contributed to her symptoms. She did not receive her flu shot this year. On exam the patient is afebrile nontoxic appearing. She is slightly diminished lung sounds bilaterally with scattered wheezes bilaterally. No increased work of breathing. Oxygen saturation is 100% on room air. Heart rate is 92. She has no lower extremity edema or tenderness. She does have rhinorrhea present.  Will provide with breathing treatment and chest x-ray and reevaluate. On reevaluation patient reports her wheezing and shortness of breath has resolved after breathing treatment. Chest x-ray shows no acute cardiopulmonary findings. No evidence of pneumonia. We'll provide her with an albuterol inhaler in the emergency department as well as 60 mg of prednisone. We'll discharge with a 4 day course of prednisone. Will refill her albuterol inhaler and nebulization treatments. We'll also discharge her with Flonase, Tessalon Perles and naproxen. Patient has no fevers here. She denies any body aches. While influenza is on my differential, I have lower suspicion for this is patient is afebrile here without antipyretics. She also complains of no body aches. She is outside the window for Tamiflu. Will have her follow closely with primary care. I advised the patient to follow-up with their primary care provider this week. I advised the patient to return to the emergency  department with new or worsening symptoms or new concerns. The patient verbalized understanding and agreement with plan.      Final Clinical Impressions(s) / ED Diagnoses   Final diagnoses:  Exacerbation of asthma, unspecified asthma severity, unspecified whether persistent  Viral URI with cough    New Prescriptions New Prescriptions   ALBUTEROL (PROVENTIL HFA;VENTOLIN HFA) 108 (90 BASE) MCG/ACT INHALER    Inhale 1-2 puffs into the lungs every 6 (six) hours as needed for wheezing or shortness of breath.   BENZONATATE (TESSALON) 100 MG CAPSULE    Take 1 capsule (100 mg total) by mouth 3 (three) times daily as needed for cough.   FLUTICASONE (FLONASE) 50 MCG/ACT NASAL SPRAY    Place 2 sprays into both nostrils daily.   NAPROXEN (NAPROSYN) 250 MG TABLET    Take 1 tablet (250 mg total) by mouth 2 (two) times daily with a  meal.   PREDNISONE (DELTASONE) 20 MG TABLET    Take 2 tablets (40 mg total) by mouth daily.     Everlene Farrier, PA-C 07/15/16 1513    Lyndal Pulley, MD 07/15/16 [phone]

## 2016-07-15 NOTE — ED Notes (Signed)
Pt ambulatory and independent at discharge.  Verbalized understanding of discharge instructions.  Awaiting breathing treatment before discharge.

## 2016-07-15 NOTE — ED Notes (Signed)
Called respiratory for breathing treatment

## 2016-07-15 NOTE — ED Notes (Signed)
Upon arrival in room, pt not in room.

## 2016-07-15 NOTE — ED Triage Notes (Signed)
Pt presents with HX of asthma, cough, congestion, sore throat for 3 weeks. OTC medications helped however when she ran out of her inhaler she got worse. Fever last night 103.

## 2016-10-16 ENCOUNTER — Emergency Department (HOSPITAL_COMMUNITY)
Admission: EM | Admit: 2016-10-16 | Discharge: 2016-10-16 | Disposition: A | Discharge status: Home / Self Care | Payer: Medicaid Other | Attending: Emergency Medicine | Admitting: Emergency Medicine

## 2016-10-16 ENCOUNTER — Encounter (HOSPITAL_COMMUNITY): Payer: Self-pay | Admitting: Emergency Medicine

## 2016-10-16 DIAGNOSIS — J45909 Unspecified asthma, uncomplicated: Secondary | ICD-10-CM | POA: Insufficient documentation

## 2016-10-16 DIAGNOSIS — K529 Noninfective gastroenteritis and colitis, unspecified: Secondary | ICD-10-CM | POA: Insufficient documentation

## 2016-10-16 DIAGNOSIS — F17210 Nicotine dependence, cigarettes, uncomplicated: Secondary | ICD-10-CM | POA: Insufficient documentation

## 2016-10-16 DIAGNOSIS — Z79899 Other long term (current) drug therapy: Secondary | ICD-10-CM | POA: Insufficient documentation

## 2016-10-16 DIAGNOSIS — Z7951 Long term (current) use of inhaled steroids: Secondary | ICD-10-CM | POA: Insufficient documentation

## 2016-10-16 LAB — I-STAT BETA HCG BLOOD, ED (MC, WL, AP ONLY): I-stat hCG, quantitative: <5 m[IU]/mL (ref <5)

## 2016-10-16 LAB — URINALYSIS, ROUTINE W REFLEX MICROSCOPIC
Bilirubin Urine: NEGATIVE
GLUCOSE, UA: NEGATIVE mg/dL
Hgb urine dipstick: NEGATIVE
Ketones, ur: NEGATIVE mg/dL
Nitrite: NEGATIVE
PH: 7 (ref 5.0–8.0)
Protein, ur: NEGATIVE mg/dL
SPECIFIC GRAVITY, URINE: 1.01 (ref 1.005–1.030)

## 2016-10-16 LAB — COMPREHENSIVE METABOLIC PANEL
ALBUMIN: 4.2 g/dL (ref 3.5–5.0)
ALT: 12 U/L — ABNORMAL LOW (ref 14–54)
AST: 18 U/L (ref 15–41)
Alkaline Phosphatase: 74 U/L (ref 38–126)
Anion gap: 10 (ref 5–15)
BILIRUBIN TOTAL: 0.5 mg/dL (ref 0.3–1.2)
BUN: 10 mg/dL (ref 6–20)
CHLORIDE: 106 mmol/L (ref 101–111)
CO2: 21 mmol/L — ABNORMAL LOW (ref 22–32)
CREATININE: 0.76 mg/dL (ref 0.44–1.00)
Calcium: 9 mg/dL (ref 8.9–10.3)
GFR calc Af Amer: >60 mL/min (ref >60)
Glucose, Bld: 101 mg/dL — ABNORMAL HIGH (ref 65–99)
POTASSIUM: 3.8 mmol/L (ref 3.5–5.1)
Sodium: 137 mmol/L (ref 135–145)
Total Protein: 8 g/dL (ref 6.5–8.1)

## 2016-10-16 LAB — LIPASE, BLOOD: LIPASE: 21 U/L (ref 11–51)

## 2016-10-16 LAB — CBC
HEMATOCRIT: 39.3 % (ref 36.0–46.0)
Hemoglobin: 13.4 g/dL (ref 12.0–15.0)
MCH: 32.4 pg (ref 26.0–34.0)
MCHC: 34.1 g/dL (ref 30.0–36.0)
MCV: 94.9 fL (ref 78.0–100.0)
Platelets: 337 10*3/uL (ref 150–400)
RBC: 4.14 MIL/uL (ref 3.87–5.11)
RDW: 13 % (ref 11.5–15.5)
WBC: 9.2 10*3/uL (ref 4.0–10.5)

## 2016-10-16 MED ORDER — PROMETHAZINE HCL 25 MG/ML IJ SOLN
12.5000 mg | Freq: Once | INTRAMUSCULAR | Status: AC
  Administered 2016-10-16: 12.5 mg via INTRAVENOUS
  Filled 2016-10-16: qty 1

## 2016-10-16 MED ORDER — ONDANSETRON 4 MG PO TBDP
4.0000 mg | ORAL_TABLET | Freq: Once | ORAL | Status: AC | PRN
  Administered 2016-10-16: 4 mg via ORAL
  Filled 2016-10-16: qty 1

## 2016-10-16 MED ORDER — SODIUM CHLORIDE 0.9 % IV BOLUS (SEPSIS)
1000.0000 mL | Freq: Once | INTRAVENOUS | Status: AC
  Administered 2016-10-16: 1000 mL via INTRAVENOUS

## 2016-10-16 MED ORDER — KETOROLAC TROMETHAMINE 30 MG/ML IJ SOLN
30.0000 mg | Freq: Once | INTRAMUSCULAR | Status: AC
  Administered 2016-10-16: 30 mg via INTRAVENOUS
  Filled 2016-10-16: qty 1

## 2016-10-16 MED ORDER — ONDANSETRON 8 MG PO TBDP: ORAL_TABLET | ORAL | 0 refills | Status: DC

## 2016-10-16 NOTE — ED Triage Notes (Signed)
Patient been having n/v, constipation and trouble urinating since yesterday per patient's visitor.  Patient been congested over week also and tried using neb treatments at home but feels nauseated when taking treatments. Patient back from in room and reports has urge to urinate just little will come out. Last BM was 2-3 days ago. Patient has productive cough with green-yellow phlegm.

## 2016-10-16 NOTE — ED Notes (Signed)
Pt verbalizes unable to provide urine sample at present time.

## 2016-10-16 NOTE — ED Notes (Signed)
Bed: WA03 Expected date:  Expected time:  Means of arrival:  Comments: 

## 2016-10-16 NOTE — ED Provider Notes (Signed)
[facility]-EMER[facility]ENC[facility] DEPT Provider Note   CSN: [id number] Arrival date & time: 10/16/16  0820     History   Chief Complaint Chief Complaint  Patient presents with  . Nausea  . Emesis    HPI Paula Wiggins is a 26 y.o. female.  Patient is a 26 year old female with no significant past medical history. She presents today for evaluation of nausea, vomiting for the past 2 days. She denies any fevers or chills. She denies any diarrhea. She reports decreased urine output. She denies any ill contacts or having consumed any suspicious foods.   The history is provided by the patient.  Emesis   This is a new problem. The current episode started yesterday. The problem occurs continuously. The problem has not changed since onset.The emesis has an appearance of stomach contents. There has been no fever. Associated symptoms include abdominal pain. Pertinent negatives include no chills, no diarrhea and no fever.    Past Medical History:  Diagnosis Date  . Asthma     There are no active problems to display for this patient.   History reviewed. No pertinent surgical history.  OB History    No data available       Home Medications    Prior to Admission medications   Medication Sig Start Date End Date Taking? Authorizing Provider  albuterol (PROVENTIL HFA;VENTOLIN HFA) 108 (90 Base) MCG/ACT inhaler Inhale 1-2 puffs into the lungs every 6 (six) hours as needed for wheezing or shortness of breath. 07/15/16   Everlene Farrieransie, William, PA-C  albuterol (PROVENTIL) (2.5 MG/3ML) 0.083% nebulizer solution Take 3 mLs (2.5 mg total) by nebulization every 6 (six) hours as needed for wheezing or shortness of breath. 07/15/16   Everlene Farrieransie, William, PA-C  benzonatate (TESSALON) 100 MG capsule Take 1 capsule (100 mg total) by mouth 3 (three) times daily as needed for cough. 07/15/16   Everlene Farrieransie, William, PA-C  fluticasone (FLONASE) 50 MCG/ACT nasal spray Place 2 sprays into both nostrils daily. 07/15/16   Everlene Farrieransie, William,  PA-C  naproxen (NAPROSYN) 250 MG tablet Take 1 tablet (250 mg total) by mouth 2 (two) times daily with a meal. 07/15/16   Everlene Farrieransie, William, PA-C  Phenyleph-Doxylamine-DM-APAP (NYQUIL SEVERE COLD/FLU) 5-6.25-10-325 MG/15ML LIQD Take 30 mLs by mouth once.    [provider]  predniSONE (DELTASONE) 20 MG tablet Take 2 tablets (40 mg total) by mouth daily. 07/15/16   Everlene Farrieransie, William, PA-C    Family History Family History  Problem Relation Age of Onset  . Hypertension Mother   . Seizures Father   . Hypertension Father     Social History Social History  Substance Use Topics  . Smoking status: Current Every Day Smoker    Types: Cigarettes  . Smokeless tobacco: Never Used  . Alcohol use Yes     Comment: occasionally     Allergies   Oxycodone   Review of Systems Review of Systems  Constitutional: Negative for chills and fever.  Gastrointestinal: Positive for abdominal pain and vomiting. Negative for diarrhea.  All other systems reviewed and are negative.    Physical Exam Updated Vital Signs BP (!) 126/99 (BP Location: Left Arm)   Pulse (!) 106   Temp 97.6 F (36.4 C) (Oral)   Resp (!) 21   LMP 10/04/2016   SpO2 100%   Physical Exam  Constitutional: She is oriented to person, place, and time. She appears well-developed and well-nourished. No distress.  HENT:  Head: Normocephalic and atraumatic.  Mouth/Throat: Oropharynx is clear and moist.  Neck: Normal range of motion. Neck supple.  Cardiovascular: Normal rate and regular rhythm.  Exam reveals no gallop and no friction rub.   No murmur heard. Pulmonary/Chest: Effort normal and breath sounds normal. No respiratory distress. She has no wheezes.  Abdominal: Soft. Bowel sounds are normal. She exhibits no distension. There is no tenderness.  Musculoskeletal: Normal range of motion. She exhibits no edema.  Neurological: She is alert and oriented to person, place, and time.  Skin: Skin is warm and dry. She is not  diaphoretic.  Nursing note and vitals reviewed.    ED Treatments / Results  Labs (all labs ordered are listed, but only abnormal results are displayed) Labs Reviewed  COMPREHENSIVE METABOLIC PANEL - Abnormal; Notable for the following:       Result Value   CO2 21 (*)    Glucose, Bld 101 (*)    ALT 12 (*)    All other components within normal limits  LIPASE, BLOOD  CBC  URINALYSIS, ROUTINE W REFLEX MICROSCOPIC  I-STAT BETA HCG BLOOD, ED ([facility], [facility], [facility] ONLY)    EKG  EKG Interpretation None       Radiology No results found.  Procedures Procedures (including critical care time)  Medications Ordered in ED Medications  sodium chloride 0.9 % bolus 1,000 mL (not administered)  ketorolac (TORADOL) 30 MG/ML injection 30 mg (not administered)  promethazine (PHENERGAN) injection 12.5 mg (not administered)  ondansetron (ZOFRAN-ODT) disintegrating tablet 4 mg (4 mg Oral Given 10/16/16 0981)     Initial Impression / Assessment and Plan / ED Course  I have reviewed the triage vital signs and the nursing notes.  Pertinent labs & imaging results that were available during my care of the patient were reviewed by me and considered in my medical decision making (see chart for details).  Patient's presentation, exam, and workup was consistent with a viral gastroenteritis. She is feeling better after IV fluids and medications and will be discharged with Zofran and when necessary return.  Final Clinical Impressions(s) / ED Diagnoses   Final diagnoses:  None    New Prescriptions New Prescriptions   No medications on file     Geoffery Lyons, MD 10/16/16 1652

## 2016-10-16 NOTE — Discharge Instructions (Signed)
Zofran as prescribed as needed for nausea.  Clear liquid diet for the next 12 hours, then slowly advance to crackers, toast, and bland diet.  Return to the emergency department if you develop severe abdominal pain, high fevers, bloody stools, or other new and concerning symptoms.

## 2016-12-05 ENCOUNTER — Emergency Department (HOSPITAL_COMMUNITY)
Admission: EM | Admit: 2016-12-05 | Discharge: 2016-12-05 | Disposition: A | Discharge status: Home / Self Care | Payer: Medicaid Other | Attending: Emergency Medicine | Admitting: Emergency Medicine

## 2016-12-05 ENCOUNTER — Encounter (HOSPITAL_COMMUNITY): Payer: Self-pay

## 2016-12-05 DIAGNOSIS — F17210 Nicotine dependence, cigarettes, uncomplicated: Secondary | ICD-10-CM | POA: Insufficient documentation

## 2016-12-05 DIAGNOSIS — J45909 Unspecified asthma, uncomplicated: Secondary | ICD-10-CM | POA: Insufficient documentation

## 2016-12-05 DIAGNOSIS — G5602 Carpal tunnel syndrome, left upper limb: Secondary | ICD-10-CM | POA: Insufficient documentation

## 2016-12-05 MED ORDER — ALBUTEROL SULFATE HFA 108 (90 BASE) MCG/ACT IN AERS
2.0000 | INHALATION_SPRAY | RESPIRATORY_TRACT | Status: DC | PRN
Start: 2016-12-05 — End: 2016-12-05
  Administered 2016-12-05: 2 via RESPIRATORY_TRACT
  Filled 2016-12-05: qty 6.7

## 2016-12-05 MED ORDER — AEROCHAMBER PLUS FLO-VU MEDIUM MISC
1.0000 | Freq: Once | Status: AC
  Administered 2016-12-05: 1
  Filled 2016-12-05: qty 1

## 2016-12-05 MED ORDER — IBUPROFEN 800 MG PO TABS
800.0000 mg | ORAL_TABLET | Freq: Once | ORAL | Status: AC
  Administered 2016-12-05: 800 mg via ORAL
  Filled 2016-12-05: qty 1

## 2016-12-05 MED ORDER — IBUPROFEN 600 MG PO TABS: 600.0000 mg | ORAL_TABLET | Freq: Four times a day (QID) | ORAL | 0 refills | Status: DC | PRN

## 2016-12-05 NOTE — ED Triage Notes (Signed)
Pt states knot on left wrist by vein.  Causing pain up arm and painful to rotate wrist

## 2016-12-05 NOTE — Discharge Instructions (Signed)
As discussed, use the wrist brace provided and ice with ibuprofen to relieve symptoms. Follow up with your primary care provider at the wellness center if symptoms persist.  Return if symptoms worsen, you have redness, swelling, warmth, fever or other concerning symptoms in the meantime.

## 2016-12-05 NOTE — ED Provider Notes (Signed)
[facility]PT Provider Note   CSN: [id number] Arrival date & time: 12/05/16  1815     History   Chief Complaint Chief Complaint  Patient presents with  . Wrist Pain    HPI Paula Wiggins is a 26 y.o. female history of asthma presenting with a week and a half of  left wrist pain which radiates to her arm with shooting pain and index and thumb. She states that this is worse at night. She has tried aspirin and a hot rag without much relief. She reports feeling a knot just proximal to the hyperthenar prominence. Denies any recent trauma or injury. No other symptoms  HPI  Past Medical History:  Diagnosis Date  . Asthma     There are no active problems to display for this patient.   History reviewed. No pertinent surgical history.  OB History    No data available       Home Medications    Prior to Admission medications   Medication Sig Start Date End Date Taking? Authorizing Provider  albuterol (PROVENTIL HFA;VENTOLIN HFA) 108 (90 Base) MCG/ACT inhaler Inhale 1-2 puffs into the lungs every 6 (six) hours as needed for wheezing or shortness of breath. 07/15/16   Everlene Farrieransie, William, PA-C  albuterol (PROVENTIL) (2.5 MG/3ML) 0.083% nebulizer solution Take 3 mLs (2.5 mg total) by nebulization every 6 (six) hours as needed for wheezing or shortness of breath. 07/15/16   Everlene Farrieransie, William, PA-C  ibuprofen (ADVIL,MOTRIN) 600 MG tablet Take 1 tablet (600 mg total) by mouth every 6 (six) hours as needed. 12/05/16   Mathews RobinsonsMitchell, Ellie Spickler B, PA-C  ondansetron (ZOFRAN ODT) 8 MG disintegrating tablet 8mg  ODT q4 hours prn nausea 10/16/16   Geoffery Lyonselo, Douglas, MD  Phenyleph-Doxylamine-DM-APAP (NYQUIL SEVERE COLD/FLU) 5-6.25-10-325 MG/15ML LIQD Take 30 mLs by mouth every 6 (six) hours as needed (For cold symptoms.).     [provider]    Family History Family History  Problem Relation Age of Onset  . Hypertension Mother   . Seizures Father   . Hypertension Father     Social  History Social History  Substance Use Topics  . Smoking status: Current Every Day Smoker    Types: Cigarettes  . Smokeless tobacco: Never Used  . Alcohol use Yes     Comment: occasionally     Allergies   Oxycodone and Watermelon flavor   Review of Systems Review of Systems  Constitutional: Negative for chills and fever.  Eyes: Negative for pain and visual disturbance.  Respiratory: Negative for cough, shortness of breath and stridor.   Cardiovascular: Negative for chest pain and palpitations.  Gastrointestinal: Negative for abdominal pain, nausea and vomiting.  Musculoskeletal: Positive for arthralgias and myalgias. Negative for back pain and joint swelling.  Skin: Negative for color change, pallor, rash and wound.  Neurological: Negative for seizures, syncope, weakness and numbness.     Physical Exam Updated Vital Signs BP 119/80   Pulse (!) 109   Temp 98.3 F (36.8 C) (Oral)   Resp 18   LMP 11/23/2016   SpO2 98%   Physical Exam  Constitutional: She appears well-developed and well-nourished. No distress.  Afebrile, nontoxic-appearing, sitting comfortably in chair in no acute distress.  HENT:  Head: Normocephalic and atraumatic.  Eyes: Conjunctivae and EOM are normal.  Neck: Normal range of motion.  Cardiovascular: Normal rate, regular rhythm, normal heart sounds and intact distal pulses.   No murmur heard. Pulmonary/Chest: Effort normal and breath sounds normal. No respiratory distress.  Abdominal: She  exhibits no distension.  Musculoskeletal: Normal range of motion. She exhibits tenderness. She exhibits no edema or deformity.  Positive Tinel and Phalen test  Neurological: She is alert. No sensory deficit.  Skin: Skin is warm and dry. Capillary refill takes less than 2 seconds. No rash noted. She is not diaphoretic. No erythema. No pallor.  Psychiatric: She has a normal mood and affect.  Nursing note and vitals reviewed.    ED Treatments / Results   Labs (all labs ordered are listed, but only abnormal results are displayed) Labs Reviewed - No data to display  EKG  EKG Interpretation None       Radiology No results found.  Procedures Procedures (including critical care time)  Medications Ordered in ED Medications  ibuprofen (ADVIL,MOTRIN) tablet 800 mg (800 mg Oral Given 12/05/16 2020)  AEROCHAMBER PLUS FLO-VU MEDIUM MISC 1 each (1 each Other Given 12/05/16 2023)     Initial Impression / Assessment and Plan / ED Course  I have reviewed the triage vital signs and the nursing notes.  Pertinent labs & imaging results that were available during my care of the patient were reviewed by me and considered in my medical decision making (see chart for details).    Patient presents with symptoms consistent with carpal tunnel syndrome. On exam she is positive for the phalen and tinel tests exacerbating symptoms. Her symptoms are worse at night.  No erythema, swelling, warmth, fever. She is afebrile nontoxic appearing.  Wrist brace provided. Discharge home with PCP follow-up and symptomatic relief.   She was well-appearing in stable for discharge. Discussed strict return precautions and advised to return to the emergency department if experiencing any new or worsening symptoms. Instructions were understood and patient agreed with discharge plan.  Final Clinical Impressions(s) / ED Diagnoses   Final diagnoses:  Carpal tunnel syndrome of left wrist    New Prescriptions Discharge Medication List as of 12/05/2016  8:07 PM       Georgiana Shore, PA-C 12/06/16 0231    Raeford Razor, MD 12/13/16 1624

## 2016-12-20 ENCOUNTER — Ambulatory Visit (HOSPITAL_COMMUNITY)
Admission: EM | Admit: 2016-12-20 | Discharge: 2016-12-20 | Disposition: A | Discharge status: Home / Self Care | Payer: Medicaid Other | Attending: Physician Assistant | Admitting: Physician Assistant

## 2016-12-20 ENCOUNTER — Encounter (HOSPITAL_COMMUNITY): Payer: Self-pay | Admitting: *Deleted

## 2016-12-20 DIAGNOSIS — M5441 Lumbago with sciatica, right side: Secondary | ICD-10-CM

## 2016-12-20 MED ORDER — NAPROXEN 500 MG PO TABS: 500.0000 mg | ORAL_TABLET | Freq: Two times a day (BID) | ORAL | 0 refills | Status: AC

## 2016-12-20 MED ORDER — KETOROLAC TROMETHAMINE 60 MG/2ML IM SOLN
60.0000 mg | Freq: Once | INTRAMUSCULAR | Status: AC
  Administered 2016-12-20: 60 mg via INTRAMUSCULAR

## 2016-12-20 MED ORDER — METHYLPREDNISOLONE SODIUM SUCC 125 MG IJ SOLR
125.0000 mg | Freq: Once | INTRAMUSCULAR | Status: AC
  Administered 2016-12-20: 125 mg via INTRAMUSCULAR

## 2016-12-20 MED ORDER — KETOROLAC TROMETHAMINE 60 MG/2ML IM SOLN
INTRAMUSCULAR | Status: AC
  Filled 2016-12-20: qty 2

## 2016-12-20 MED ORDER — METHYLPREDNISOLONE SODIUM SUCC 125 MG IJ SOLR
INTRAMUSCULAR | Status: AC
  Filled 2016-12-20: qty 2

## 2016-12-20 NOTE — ED Triage Notes (Signed)
Pt  Reports   Pain  r    Side         With    Pain      Pain   Down  r  Leg     Radiating       Pt  States   Was  Lifting  A  Heavy  Object  3  Days  Ago  And  Felt  A  Pop

## 2016-12-20 NOTE — ED Provider Notes (Signed)
CSN: [id number]     Arrival date & time 12/20/16  1922 History   None    Chief Complaint  Patient presents with  . Back Pain   (Consider location/radiation/quality/duration/timing/severity/associated sxs/prior Treatment) 26 year old female comes in for 3 day history of back pain. She was walking up the stairs with a heavy object, and felt a pop in her back. She has since had low back pain with pain radiating down her right leg. Denies numbness/tingling of the inner thighs, loss of bladder or bowel control. Has been taking ibuprofen on and off for the pain with little relief. Denies urinary symptoms such as frequency, dysuria, hematuria.       Past Medical History:  Diagnosis Date  . Asthma    History reviewed. No pertinent surgical history. Family History  Problem Relation Age of Onset  . Hypertension Mother   . Seizures Father   . Hypertension Father    Social History  Substance Use Topics  . Smoking status: Current Every Day Smoker    Types: Cigarettes  . Smokeless tobacco: Never Used  . Alcohol use Yes     Comment: occasionally   OB History    No data available     Review of Systems  Constitutional: Negative for chills, diaphoresis, fatigue and fever.  Musculoskeletal: Positive for back pain, gait problem and myalgias. Negative for joint swelling.    Allergies  Oxycodone and Watermelon flavor  Home Medications   Prior to Admission medications   Medication Sig Start Date End Date Taking? Authorizing Provider  albuterol (PROVENTIL HFA;VENTOLIN HFA) 108 (90 Base) MCG/ACT inhaler Inhale 1-2 puffs into the lungs every 6 (six) hours as needed for wheezing or shortness of breath. 07/15/16   Everlene Farrieransie, William, PA-C  albuterol (PROVENTIL) (2.5 MG/3ML) 0.083% nebulizer solution Take 3 mLs (2.5 mg total) by nebulization every 6 (six) hours as needed for wheezing or shortness of breath. 07/15/16   Everlene Farrieransie, William, PA-C  ibuprofen (ADVIL,MOTRIN) 600 MG tablet Take 1 tablet (600 mg  total) by mouth every 6 (six) hours as needed. 12/05/16   Mathews RobinsonsMitchell, Jessica B, PA-C  naproxen (NAPROSYN) 500 MG tablet Take 1 tablet (500 mg total) by mouth 2 (two) times daily. 12/20/16 12/30/16  Belinda FisherYu, Maribelle Hopple V, PA-C  ondansetron (ZOFRAN ODT) 8 MG disintegrating tablet 8mg  ODT q4 hours prn nausea 10/16/16   Geoffery Lyonselo, Douglas, MD  Phenyleph-Doxylamine-DM-APAP (NYQUIL SEVERE COLD/FLU) 5-6.25-10-325 MG/15ML LIQD Take 30 mLs by mouth every 6 (six) hours as needed (For cold symptoms.).     [provider]   Meds Ordered and Administered this Visit   Medications  ketorolac (TORADOL) injection 60 mg (60 mg Intramuscular Given 12/20/16 2043)  methylPREDNISolone sodium succinate (SOLU-MEDROL) 125 mg/2 mL injection 125 mg (125 mg Intramuscular Given 12/20/16 2043)    BP 114/81 (BP Location: Left Arm)   Pulse (!) 108   Temp 98.1 F (36.7 C) (Oral)   Resp 18   LMP 11/23/2016   SpO2 97%  No data found.   Physical Exam  Constitutional: She is oriented to person, place, and time. She appears well-developed and well-nourished. No distress.  HENT:  Head: Normocephalic and atraumatic.  Cardiovascular: Normal rate, regular rhythm and normal heart sounds.  Exam reveals no gallop and no friction rub.   No murmur heard. Pulmonary/Chest: Effort normal and breath sounds normal. She has no wheezes. She has no rales.  Musculoskeletal:  Tenderness on palpation of the midline and right lower lumbar region. Mild tenderness on palpation  of the right hip. Full passive ROM of the right hip. Patient unable to flex right hip on own due to pain. Positive straight leg raise. Sensation intact   Neurological: She is alert and oriented to person, place, and time.    Urgent Care Course     Procedures (including critical care time)  Labs Review Labs Reviewed - No data to display  Imaging Review No results found.     MDM   1. Acute right-sided low back pain with right-sided sciatica    Discussed with patient exam  consistent with low back pain with sciatica. Given moderate tenderness on exam, offered Toradol and solumedrol injection in office, patient would like to proceed with injections. Patient to take naproxen as directed 6 hours after toradol injection. Follow up with PCP for further workup/referrals needed if symptoms do not resolve/improve. Return precautions given.    Belinda FisherYu, Kiley Solimine V, PA-C 12/20/16 2157

## 2016-12-20 NOTE — Discharge Instructions (Signed)
You were given a Toradol and prednisone injection today. Start naproxen 6 hours after Toradol injection, take as directed for the next 10 days. This can take up to 2-3 weeks to resolve, but should be getting better each day/week. Follow-up with PCP for further workup or referral as needed if symptoms do not resolve. Monitor for worsening of symptoms, numbness and tingling of the inner thighs, loss of bladder or bowel control, to go to the emergency department for further evaluation.

## 2016-12-25 ENCOUNTER — Emergency Department (HOSPITAL_COMMUNITY)
Admission: EM | Admit: 2016-12-25 | Discharge: 2016-12-25 | Disposition: A | Discharge status: Home / Self Care | Payer: Self-pay | Attending: Emergency Medicine | Admitting: Emergency Medicine

## 2016-12-25 ENCOUNTER — Emergency Department (HOSPITAL_COMMUNITY): Payer: Self-pay

## 2016-12-25 ENCOUNTER — Encounter (HOSPITAL_COMMUNITY): Payer: Self-pay | Admitting: Nurse Practitioner

## 2016-12-25 DIAGNOSIS — Z79899 Other long term (current) drug therapy: Secondary | ICD-10-CM | POA: Insufficient documentation

## 2016-12-25 DIAGNOSIS — M545 Low back pain, unspecified: Secondary | ICD-10-CM

## 2016-12-25 DIAGNOSIS — F17210 Nicotine dependence, cigarettes, uncomplicated: Secondary | ICD-10-CM | POA: Insufficient documentation

## 2016-12-25 DIAGNOSIS — J4520 Mild intermittent asthma, uncomplicated: Secondary | ICD-10-CM

## 2016-12-25 DIAGNOSIS — J45909 Unspecified asthma, uncomplicated: Secondary | ICD-10-CM | POA: Insufficient documentation

## 2016-12-25 DIAGNOSIS — G8929 Other chronic pain: Secondary | ICD-10-CM | POA: Insufficient documentation

## 2016-12-25 LAB — POC URINE PREG, ED: Preg Test, Ur: NEGATIVE

## 2016-12-25 MED ORDER — ALBUTEROL SULFATE (2.5 MG/3ML) 0.083% IN NEBU
2.5000 mg | INHALATION_SOLUTION | Freq: Once | RESPIRATORY_TRACT | Status: AC
  Administered 2016-12-25: 2.5 mg via RESPIRATORY_TRACT
  Filled 2016-12-25: qty 3

## 2016-12-25 MED ORDER — METHOCARBAMOL 500 MG PO TABS: 500.0000 mg | ORAL_TABLET | Freq: Two times a day (BID) | ORAL | 0 refills | Status: DC

## 2016-12-25 MED ORDER — ALBUTEROL SULFATE HFA 108 (90 BASE) MCG/ACT IN AERS
1.0000 | INHALATION_SPRAY | RESPIRATORY_TRACT | Status: DC | PRN
  Administered 2016-12-25: 1 via RESPIRATORY_TRACT
  Filled 2016-12-25: qty 6.7

## 2016-12-25 NOTE — Discharge Instructions (Signed)
Please read attached information. If you experience any new or worsening signs or symptoms please return to the emergency room for evaluation. Please follow-up with your primary care provider or specialist as discussed. Please use medication prescribed only as directed and discontinue taking if you have any concerning signs or symptoms.   °

## 2016-12-25 NOTE — ED Triage Notes (Signed)
Pt is c/o severe back pain, onset 2 weeks ago, evaluated 5 days ago without any improvement. States it is interfering with her mobility. Also requesting an inhaler and prescription for her asthma medication.

## 2016-12-25 NOTE — ED Provider Notes (Signed)
[facility]-EMERGENCY DEPT Provider Note   CSN: [id number] Arrival date & time: 12/25/16  1906     History   Chief Complaint Chief Complaint  Patient presents with  . Back Pain    HPI Paula Wiggins is a 26 y.o. female.  HPI   26 year old female presents today with complaints of back pain.  Patient reports that approximately 3 weeks ago she was lifting a package when she felt a sharp pain in her lower back.  She notes the pain is mostly over the right lower lumbar lateral musculature.  She also notes some radiation to the left lateral musculature.  Patient notes occasionally she has tingling in her bilateral toes in the morning, none presently now.  She denies any loss of distal strength or motor function.  She denies any other significant red flags for back pain.  She was seen by primary care, instructed to use ibuprofen but notes that symptoms have not improved.  Patient also reports a history of asthma requesting refill of her asthma medication.  She notes she has been having frequent flares most notably today.  She denies any significant shortness of breath, but does note some wheezing.  She denies any infectious etiology, chest pain.    Past Medical History:  Diagnosis Date  . Asthma     There are no active problems to display for this patient.   History reviewed. No pertinent surgical history.  OB History    No data available       Home Medications    Prior to Admission medications   Medication Sig Start Date End Date Taking? Authorizing Provider  albuterol (PROVENTIL HFA;VENTOLIN HFA) 108 (90 Base) MCG/ACT inhaler Inhale 1-2 puffs into the lungs every 6 (six) hours as needed for wheezing or shortness of breath. 07/15/16   Everlene Farrier, PA-C  albuterol (PROVENTIL) (2.5 MG/3ML) 0.083% nebulizer solution Take 3 mLs (2.5 mg total) by nebulization every 6 (six) hours as needed for wheezing or shortness of breath. 07/15/16   Everlene Farrier, PA-C  ibuprofen (ADVIL,MOTRIN)  600 MG tablet Take 1 tablet (600 mg total) by mouth every 6 (six) hours as needed. 12/05/16   Georgiana Shore, PA-C  methocarbamol (ROBAXIN) 500 MG tablet Take 1 tablet (500 mg total) by mouth 2 (two) times daily. 12/25/16   Asa Fath, Tinnie Gens, PA-C  naproxen (NAPROSYN) 500 MG tablet Take 1 tablet (500 mg total) by mouth 2 (two) times daily. 12/20/16 12/30/16  Belinda Fisher, PA-C  ondansetron (ZOFRAN ODT) 8 MG disintegrating tablet 8mg  ODT q4 hours prn nausea 10/16/16   Geoffery Lyons, MD  Phenyleph-Doxylamine-DM-APAP (NYQUIL SEVERE COLD/FLU) 5-6.25-10-325 MG/15ML LIQD Take 30 mLs by mouth every 6 (six) hours as needed (For cold symptoms.).     [provider]    Family History Family History  Problem Relation Age of Onset  . Hypertension Mother   . Seizures Father   . Hypertension Father     Social History Social History  Substance Use Topics  . Smoking status: Current Every Day Smoker    Types: Cigarettes  . Smokeless tobacco: Never Used  . Alcohol use Yes     Comment: occasionally     Allergies   Oxycodone and Watermelon flavor   Review of Systems Review of Systems  All other systems reviewed and are negative.    Physical Exam Updated Vital Signs BP 133/71 (BP Location: Left Arm)   Pulse (!) 104   Temp 98.8 F (37.1 C) (Oral)   Resp 18  LMP 12/17/2016   SpO2 99%   Physical Exam  Constitutional: She is oriented to person, place, and time. She appears well-developed and well-nourished.  HENT:  Head: Normocephalic and atraumatic.  Eyes: Pupils are equal, round, and reactive to light. Conjunctivae are normal. Right eye exhibits no discharge. Left eye exhibits no discharge. No scleral icterus.  Neck: Normal range of motion. No JVD present. No tracheal deviation present.  Pulmonary/Chest: Effort normal. No stridor.  Musculoskeletal:  No CT or L-spine tenderness.  Tenderness palpation of the bilateral lumbar musculature.  Bilateral sensation strength and motor  function of the lower extremities is intact.  Straight leg negative bilateral  Neurological: She is alert and oriented to person, place, and time. Coordination normal.  Psychiatric: She has a normal mood and affect. Her behavior is normal. Judgment and thought content normal.  Nursing note and vitals reviewed.    ED Treatments / Results  Labs (all labs ordered are listed, but only abnormal results are displayed) Labs Reviewed  POC URINE PREG, ED    EKG  EKG Interpretation None       Radiology Dg Lumbar Spine Complete  Result Date: 12/25/2016 CLINICAL DATA:  Subacute onset of right lower back pain, status post fall down stairs 3 weeks ago. Initial encounter. EXAM: LUMBAR SPINE - COMPLETE 4+ VIEW COMPARISON:  None. FINDINGS: There is no evidence of fracture or subluxation. Vertebral bodies demonstrate normal height and alignment. Intervertebral disc spaces are preserved. The visualized neural foramina are grossly unremarkable in appearance. The visualized bowel gas pattern is unremarkable in appearance; air and stool are noted within the colon. The sacroiliac joints are within normal limits. IMPRESSION: No evidence of fracture or subluxation along the lumbar spine. Electronically Signed   By: Roanna RaiderJeffery  Chang M.D.   On: 12/25/2016 21:20    Procedures Procedures (including critical care time)  Medications Ordered in ED Medications  albuterol (PROVENTIL HFA;VENTOLIN HFA) 108 (90 Base) MCG/ACT inhaler 1-2 puff (1 puff Inhalation Given 12/25/16 2101)  albuterol (PROVENTIL) (2.5 MG/3ML) 0.083% nebulizer solution 2.5 mg (2.5 mg Nebulization Given 12/25/16 2058)     Initial Impression / Assessment and Plan / ED Course  I have reviewed the triage vital signs and the nursing notes.  Pertinent labs & imaging results that were available during my care of the patient were reviewed by me and considered in my medical decision making (see chart for details).      Final Clinical Impressions(s)  / ED Diagnoses   Final diagnoses:  Bilateral low back pain without sciatica, unspecified chronicity  Intermittent asthma without complication, unspecified asthma severity   Labs: Point-of-care urine pregnancy  Imaging: DG lumbar  Consults:  Therapeutics: Albuterol  Discharge Meds: Albuterol  Assessment/Plan: 26 year old female presents today with back pain.  This appears uncomplicated.  Likely muscular strain.  She has no acute neurological deficits here or any other red flags.  She has had 3 weeks of symptoms without improvement.  She will refer to neurosurgery for ongoing evaluation and management.    Patient was very minor wheeze on exam.  She was given a breathing treatment here which improved her symptoms.  She is given an inhaler to take home.  She will follow-up with primary care for reevaluation further management.  Patient given strict return precautions, she verbalized understanding and agreement to today's plan had no further questions or concerns the time discharge.  New Prescriptions New Prescriptions   METHOCARBAMOL (ROBAXIN) 500 MG TABLET    Take 1 tablet (500  mg total) by mouth 2 (two) times daily.     Eyvonne Mechanic, PA-C 12/25/16 2314    Gerhard Munch, MD 12/26/16 [phone]

## 2017-01-09 ENCOUNTER — Emergency Department (HOSPITAL_COMMUNITY)
Admission: EM | Admit: 2017-01-09 | Discharge: 2017-01-09 | Disposition: A | Discharge status: Home / Self Care | Payer: Medicaid Other

## 2017-01-09 NOTE — ED Notes (Signed)
Called pt from waiting room no answer.

## 2017-01-27 ENCOUNTER — Emergency Department (HOSPITAL_COMMUNITY): Payer: Self-pay

## 2017-01-27 ENCOUNTER — Encounter (HOSPITAL_COMMUNITY): Payer: Self-pay | Admitting: Emergency Medicine

## 2017-01-27 ENCOUNTER — Emergency Department (HOSPITAL_COMMUNITY)
Admission: EM | Admit: 2017-01-27 | Discharge: 2017-01-27 | Disposition: A | Discharge status: Home / Self Care | Payer: Self-pay | Attending: Emergency Medicine | Admitting: Emergency Medicine

## 2017-01-27 DIAGNOSIS — J45901 Unspecified asthma with (acute) exacerbation: Secondary | ICD-10-CM

## 2017-01-27 DIAGNOSIS — Z79899 Other long term (current) drug therapy: Secondary | ICD-10-CM | POA: Insufficient documentation

## 2017-01-27 DIAGNOSIS — J4541 Moderate persistent asthma with (acute) exacerbation: Secondary | ICD-10-CM | POA: Insufficient documentation

## 2017-01-27 DIAGNOSIS — F17210 Nicotine dependence, cigarettes, uncomplicated: Secondary | ICD-10-CM | POA: Insufficient documentation

## 2017-01-27 MED ORDER — PREDNISONE 10 MG (21) PO TBPK: ORAL_TABLET | ORAL | 0 refills | Status: DC

## 2017-01-27 MED ORDER — ALBUTEROL SULFATE (2.5 MG/3ML) 0.083% IN NEBU
5.0000 mg | INHALATION_SOLUTION | Freq: Once | RESPIRATORY_TRACT | Status: AC
  Administered 2017-01-27: 5 mg via RESPIRATORY_TRACT

## 2017-01-27 MED ORDER — ALBUTEROL (5 MG/ML) CONTINUOUS INHALATION SOLN
10.0000 mg/h | INHALATION_SOLUTION | Freq: Once | RESPIRATORY_TRACT | Status: AC
  Administered 2017-01-27: 10 mg/h via RESPIRATORY_TRACT
  Filled 2017-01-27: qty 20

## 2017-01-27 MED ORDER — ALBUTEROL SULFATE HFA 108 (90 BASE) MCG/ACT IN AERS
2.0000 | INHALATION_SPRAY | RESPIRATORY_TRACT | Status: DC
  Administered 2017-01-27: 2 via RESPIRATORY_TRACT
  Filled 2017-01-27: qty 6.7

## 2017-01-27 MED ORDER — ALBUTEROL SULFATE (2.5 MG/3ML) 0.083% IN NEBU
INHALATION_SOLUTION | RESPIRATORY_TRACT | Status: AC
  Filled 2017-01-27: qty 3

## 2017-01-27 MED ORDER — PREDNISONE 20 MG PO TABS
60.0000 mg | ORAL_TABLET | Freq: Every day | ORAL | Status: DC
  Administered 2017-01-27: 60 mg via ORAL
  Filled 2017-01-27: qty 3

## 2017-01-27 NOTE — ED Triage Notes (Signed)
Pt reports asthma flare x3 days, out of breathing tx and inhaler, reports productive cough. Pt speaking in full sentences

## 2017-01-27 NOTE — Discharge Instructions (Signed)
[facility].  Return if any problems.

## 2017-01-28 NOTE — ED Provider Notes (Signed)
[facility]-EMERGENCY DEPT Provider Note   CSN: [id number] Arrival date & time: 01/27/17  1736     History   Chief Complaint Chief Complaint  Patient presents with  . Asthma    HPI Paula Wiggins is a 26 y.o. female.  The history is provided by the patient. No language interpreter was used.  Asthma  This is a new problem. The current episode started more than 2 days ago. The problem occurs hourly. The problem has been gradually worsening. Associated symptoms include shortness of breath. Pertinent negatives include no chest pain. Nothing aggravates the symptoms. Nothing relieves the symptoms. She has tried nothing for the symptoms. The treatment provided no relief.  Pt complains of asthma not relieved by albuterol   Past Medical History:  Diagnosis Date  . Asthma     There are no active problems to display for this patient.   History reviewed. No pertinent surgical history.  OB History    No data available       Home Medications    Prior to Admission medications   Medication Sig Start Date End Date Taking? Authorizing Provider  albuterol (PROVENTIL HFA;VENTOLIN HFA) 108 (90 Base) MCG/ACT inhaler Inhale 1-2 puffs into the lungs every 6 (six) hours as needed for wheezing or shortness of breath. 07/15/16   Everlene Farrier, PA-C  albuterol (PROVENTIL) (2.5 MG/3ML) 0.083% nebulizer solution Take 3 mLs (2.5 mg total) by nebulization every 6 (six) hours as needed for wheezing or shortness of breath. 07/15/16   Everlene Farrier, PA-C  ibuprofen (ADVIL,MOTRIN) 600 MG tablet Take 1 tablet (600 mg total) by mouth every 6 (six) hours as needed. 12/05/16   Georgiana Shore, PA-C  methocarbamol (ROBAXIN) 500 MG tablet Take 1 tablet (500 mg total) by mouth 2 (two) times daily. 12/25/16   Hedges, Tinnie Gens, PA-C  ondansetron (ZOFRAN ODT) 8 MG disintegrating tablet  ODT q4 hours prn nausea 10/16/16   Geoffery Lyons, MD  Phenyleph-Doxylamine-DM-APAP (NYQUIL SEVERE COLD/FLU) 5-6.25-10-325 MG/15ML  LIQD Take 30 mLs by mouth every 6 (six) hours as needed (For cold symptoms.).     [provider]  predniSONE (STERAPRED UNI-PAK 21 TAB) 10 MG (21) TBPK tablet 6,5,4,3,2,1 taper 01/27/17   Elson Areas, PA-C    Family History Family History  Problem Relation Age of Onset  . Hypertension Mother   . Seizures Father   . Hypertension Father     Social History Social History  Substance Use Topics  . Smoking status: Current Every Day Smoker    Types: Cigarettes  . Smokeless tobacco: Never Used  . Alcohol use Yes     Comment: occasionally     Allergies   Oxycodone and Watermelon flavor   Review of Systems Review of Systems  Respiratory: Positive for shortness of breath.   Cardiovascular: Negative for chest pain.  All other systems reviewed and are negative.    Physical Exam Updated Vital Signs BP (!) 120/93 (BP Location: Right Arm)   Pulse (!) 118   Temp 98.4 F (36.9 C) (Oral)   Resp (!) 22   LMP 01/20/2017 (Approximate)   SpO2 99%   Physical Exam  Constitutional: She appears well-developed and well-nourished. No distress.  HENT:  Head: Normocephalic and atraumatic.  Eyes: Conjunctivae are normal.  Neck: Neck supple.  Cardiovascular: Normal rate and regular rhythm.   No murmur heard. Pulmonary/Chest: No respiratory distress. She has wheezes.  Abdominal: Soft. There is no tenderness.  Musculoskeletal: She exhibits no edema.  Neurological: She is alert.  Skin: Skin is warm and dry.  Psychiatric: She has a normal mood and affect.  Nursing note and vitals reviewed.   Pt given 1 hour albuterol treatment.  Pt feels better.  Pt advised she needs to follow up at [facility].   ED Treatments / Results  Labs (all labs ordered are listed, but only abnormal results are displayed) Labs Reviewed - No data to display  EKG  EKG Interpretation None       Radiology Dg Chest 2 View  Result Date: 01/27/2017 CLINICAL DATA:  Wheezing, asthma. EXAM: CHEST  2  VIEW COMPARISON:  07/15/2016 FINDINGS: Heart and mediastinal contours are within normal limits. No focal opacities or effusions. No acute bony abnormality. IMPRESSION: No active cardiopulmonary disease. Electronically Signed   By: Charlett NoseKevin  Dover M.D.   On: 01/27/2017 19:11    Procedures Procedures (including critical care time)  Medications Ordered in ED Medications  albuterol (PROVENTIL) (2.5 MG/3ML) 0.083% nebulizer solution (not administered)  predniSONE (DELTASONE) tablet 60 mg (60 mg Oral Given 01/27/17 2308)  albuterol (PROVENTIL HFA;VENTOLIN HFA) 108 (90 Base) MCG/ACT inhaler 2 puff (2 puffs Inhalation Given 01/27/17 2346)  albuterol (PROVENTIL) (2.5 MG/3ML) 0.083% nebulizer solution 5 mg (5 mg Nebulization Given 01/27/17 1803)  albuterol (PROVENTIL,VENTOLIN) solution continuous neb (10 mg/hr Nebulization Given 01/27/17 2139)     Initial Impression / Assessment and Plan / ED Course  I have reviewed the triage vital signs and the nursing notes.  Pertinent labs & imaging results that were available during my care of the patient were reviewed by me and considered in my medical decision making (see chart for details).       Final Clinical Impressions(s) / ED Diagnoses   Final diagnoses:  Moderate asthma with exacerbation, unspecified whether persistent    New Prescriptions Discharge Medication List as of 01/27/2017 11:37 PM    An After Visit Summary was printed and given to the patient. Meds ordered this encounter  Medications  . albuterol (PROVENTIL) (2.5 MG/3ML) 0.083% nebulizer solution 5 mg  . albuterol (PROVENTIL) (2.5 MG/3ML) 0.083% nebulizer solution    Carlan, Chelsea   : cabinet override  . albuterol (PROVENTIL,VENTOLIN) solution continuous neb  . predniSONE (DELTASONE) tablet 60 mg  . albuterol (PROVENTIL HFA;VENTOLIN HFA) 108 (90 Base) MCG/ACT inhaler 2 puff  . predniSONE (STERAPRED UNI-PAK 21 TAB) 10 MG (21) TBPK tablet    Sig: 6,5,4,3,2,1 taper    Dispense:  21  tablet    Refill:  0    Order Specific Question:   Supervising Provider    Answer:   Eber HongMILLER, BRIAN [[id number]]     Elson AreasSofia, Leslie K, PA-C 01/28/17 [id number]    Benjiman CorePickering, Nathan, MD 01/28/17 1553

## 2017-02-12 ENCOUNTER — Encounter (HOSPITAL_COMMUNITY): Payer: Self-pay | Admitting: Emergency Medicine

## 2017-02-12 ENCOUNTER — Other Ambulatory Visit: Payer: Self-pay

## 2017-02-12 ENCOUNTER — Inpatient Hospital Stay (HOSPITAL_COMMUNITY)
Admission: EM | Admit: 2017-02-12 | Discharge: 2017-02-15 | DRG: 203 | Disposition: A | Discharge status: Home / Self Care | Payer: PRIVATE HEALTH INSURANCE | Attending: Internal Medicine | Admitting: Internal Medicine

## 2017-02-12 ENCOUNTER — Emergency Department (HOSPITAL_COMMUNITY): Payer: PRIVATE HEALTH INSURANCE

## 2017-02-12 DIAGNOSIS — Z23 Encounter for immunization: Secondary | ICD-10-CM

## 2017-02-12 DIAGNOSIS — J45901 Unspecified asthma with (acute) exacerbation: Secondary | ICD-10-CM

## 2017-02-12 DIAGNOSIS — T380X5A Adverse effect of glucocorticoids and synthetic analogues, initial encounter: Secondary | ICD-10-CM | POA: Diagnosis not present

## 2017-02-12 DIAGNOSIS — J209 Acute bronchitis, unspecified: Secondary | ICD-10-CM | POA: Diagnosis present

## 2017-02-12 DIAGNOSIS — J4551 Severe persistent asthma with (acute) exacerbation: Principal | ICD-10-CM | POA: Diagnosis present

## 2017-02-12 DIAGNOSIS — Z77120 Contact with and (suspected) exposure to mold (toxic): Secondary | ICD-10-CM

## 2017-02-12 DIAGNOSIS — E876 Hypokalemia: Secondary | ICD-10-CM | POA: Diagnosis present

## 2017-02-12 DIAGNOSIS — K5900 Constipation, unspecified: Secondary | ICD-10-CM | POA: Diagnosis not present

## 2017-02-12 DIAGNOSIS — J4550 Severe persistent asthma, uncomplicated: Secondary | ICD-10-CM | POA: Diagnosis present

## 2017-02-12 DIAGNOSIS — Z885 Allergy status to narcotic agent status: Secondary | ICD-10-CM

## 2017-02-12 DIAGNOSIS — Z87891 Personal history of nicotine dependence: Secondary | ICD-10-CM

## 2017-02-12 DIAGNOSIS — J45909 Unspecified asthma, uncomplicated: Secondary | ICD-10-CM

## 2017-02-12 DIAGNOSIS — Z91018 Allergy to other foods: Secondary | ICD-10-CM

## 2017-02-12 DIAGNOSIS — Z8249 Family history of ischemic heart disease and other diseases of the circulatory system: Secondary | ICD-10-CM

## 2017-02-12 MED ORDER — ALBUTEROL (5 MG/ML) CONTINUOUS INHALATION SOLN
10.0000 mg/h | INHALATION_SOLUTION | Freq: Once | RESPIRATORY_TRACT | Status: AC
  Administered 2017-02-12: 10 mg/h via RESPIRATORY_TRACT
  Filled 2017-02-12: qty 20

## 2017-02-12 MED ORDER — ALBUTEROL SULFATE (2.5 MG/3ML) 0.083% IN NEBU
5.0000 mg | INHALATION_SOLUTION | Freq: Once | RESPIRATORY_TRACT | Status: AC
  Administered 2017-02-12: 5 mg via RESPIRATORY_TRACT
  Filled 2017-02-12 (×2): qty 6

## 2017-02-12 MED ORDER — METHYLPREDNISOLONE SODIUM SUCC 125 MG IJ SOLR
125.0000 mg | Freq: Once | INTRAMUSCULAR | Status: AC
  Administered 2017-02-12: 125 mg via INTRAVENOUS
  Filled 2017-02-12: qty 2

## 2017-02-12 NOTE — ED Notes (Signed)
Bed: WTR6 Expected date:  Expected time:  Means of arrival:  Comments: 

## 2017-02-12 NOTE — ED Triage Notes (Signed)
Pt states she has asthma and it has gotten worse over the past few days  Pt states she has a nonproductive cough and chest tightness today  Pt has labored respirations and states she gets short of breath on exertion

## 2017-02-12 NOTE — ED Provider Notes (Signed)
[facility]-EMERGENCY DEPT Provider Note   CSN: [id number] Arrival date & time: 02/12/17  2142     History   Chief Complaint Chief Complaint  Patient presents with  . Shortness of Breath    HPI Paula Wiggins is a 26 y.o. female.  The history is provided by the patient. No language interpreter was used.  Shortness of Breath  This is a new problem. The average episode lasts 2 days. The problem occurs continuously.The current episode started more than 2 days ago. The problem has been gradually worsening. Pertinent negatives include no fever. She has tried nothing for the symptoms. The treatment provided no relief. She has had no prior hospitalizations. She has had no prior ICU admissions. Associated medical issues include asthma.   Pt reports she has a history of asthma.  Pt reports cough and congestion for the past 3 days.   Past Medical History:  Diagnosis Date  . Asthma     There are no active problems to display for this patient.   History reviewed. No pertinent surgical history.  OB History    No data available       Home Medications    Prior to Admission medications   Medication Sig Start Date End Date Taking? Authorizing Provider  albuterol (PROVENTIL HFA;VENTOLIN HFA) 108 (90 Base) MCG/ACT inhaler Inhale 1-2 puffs into the lungs every 6 (six) hours as needed for wheezing or shortness of breath. 07/15/16   Everlene Farrier, PA-C  albuterol (PROVENTIL) (2.5 MG/3ML) 0.083% nebulizer solution Take 3 mLs (2.5 mg total) by nebulization every 6 (six) hours as needed for wheezing or shortness of breath. 07/15/16   Everlene Farrier, PA-C  ibuprofen (ADVIL,MOTRIN) 600 MG tablet Take 1 tablet (600 mg total) by mouth every 6 (six) hours as needed. 12/05/16   Georgiana Shore, PA-C  methocarbamol (ROBAXIN) 500 MG tablet Take 1 tablet (500 mg total) by mouth 2 (two) times daily. 12/25/16   Hedges, Tinnie Gens, PA-C  ondansetron (ZOFRAN ODT) 8 MG disintegrating tablet  ODT q4 hours prn  nausea 10/16/16   Geoffery Lyons, MD  Phenyleph-Doxylamine-DM-APAP (NYQUIL SEVERE COLD/FLU) 5-6.25-10-325 MG/15ML LIQD Take 30 mLs by mouth every 6 (six) hours as needed (For cold symptoms.).     [provider]  predniSONE (STERAPRED UNI-PAK 21 TAB) 10 MG (21) TBPK tablet 6,5,4,3,2,1 taper 01/27/17   Elson Areas, PA-C    Family History Family History  Problem Relation Age of Onset  . Hypertension Mother   . Seizures Father   . Hypertension Father     Social History Social History  Substance Use Topics  . Smoking status: Former Smoker    Types: Cigarettes  . Smokeless tobacco: Never Used  . Alcohol use No     Allergies   Oxycodone and Watermelon flavor   Review of Systems Review of Systems  Constitutional: Negative for fever.  Respiratory: Positive for shortness of breath.   All other systems reviewed and are negative.    Physical Exam Updated Vital Signs BP (!) 145/101 (BP Location: Left Arm)   Pulse (!) 113   Temp 98.6 F (37 C) (Oral)   Resp (!) 24   LMP 02/08/2017   SpO2 99%   Physical Exam  Constitutional: She appears well-developed and well-nourished. No distress.  HENT:  Head: Normocephalic and atraumatic.  Right Ear: External ear normal.  Left Ear: External ear normal.  Eyes: Conjunctivae are normal.  Neck: Neck supple.  Cardiovascular: Normal rate and regular rhythm.   No murmur  heard. Pulmonary/Chest: Effort normal and breath sounds normal. No respiratory distress.  Abdominal: Soft. There is no tenderness.  Musculoskeletal: She exhibits no edema.  Neurological: She is alert.  Skin: Skin is warm and dry.  Psychiatric: She has a normal mood and affect.  Nursing note and vitals reviewed.    ED Treatments / Results  Labs (all labs ordered are listed, but only abnormal results are displayed) Labs Reviewed  CBC WITH DIFFERENTIAL/PLATELET  BASIC METABOLIC PANEL    EKG  EKG Interpretation None       Radiology Dg Chest 2  View  Result Date: 02/12/2017 CLINICAL DATA:  Shortness of breath EXAM: CHEST  2 VIEW COMPARISON:  Chest radiograph 01/27/2017 FINDINGS: The heart size and mediastinal contours are within normal limits. Both lungs are clear. The visualized skeletal structures are unremarkable. IMPRESSION: No active cardiopulmonary disease. Electronically Signed   By: Deatra Robinson M.D.   On: 02/12/2017 22:09    Procedures Procedures (including critical care time)  Medications Ordered in ED Medications  albuterol (PROVENTIL) (2.5 MG/3ML) 0.083% nebulizer solution 5 mg (5 mg Nebulization Given 02/12/17 2204)  methylPREDNISolone sodium succinate (SOLU-MEDROL) 125 mg/2 mL injection 125 mg (125 mg Intravenous Given 02/12/17 2303)  albuterol (PROVENTIL,VENTOLIN) solution continuous neb (10 mg/hr Nebulization Given 02/12/17 2238)     Initial Impression / Assessment and Plan / ED Course  I have reviewed the triage vital signs and the nursing notes.  Pertinent labs & imaging results that were available during my care of the patient were reviewed by me and considered in my medical decision making (see chart for details).      Chest xray is normal.  Pt reports minimal relief after albuterol treatment.  Pt given solumedrol  IV and started one 1 hour continuous treatment.  Pt reexamined after treatment.  Pt has conscious wheezing.  Magnesium sulfate ordered.  Consult to hospitalist to admit.  Dr. Robb Matar request ABg.    Final Clinical Impressions(s) / ED Diagnoses   Final diagnoses:  Moderate asthma with exacerbation, unspecified whether persistent  Severe persistent asthma with exacerbation    New Prescriptions New Prescriptions   No medications on file     Osie Cheeks 02/12/17 2325    Elson Areas, PA-C 02/13/17 0035    Samuel Jester, DO 02/13/17 2319

## 2017-02-13 ENCOUNTER — Encounter (HOSPITAL_COMMUNITY): Payer: Self-pay | Admitting: Internal Medicine

## 2017-02-13 DIAGNOSIS — Z91018 Allergy to other foods: Secondary | ICD-10-CM | POA: Diagnosis not present

## 2017-02-13 DIAGNOSIS — Z87891 Personal history of nicotine dependence: Secondary | ICD-10-CM | POA: Diagnosis not present

## 2017-02-13 DIAGNOSIS — J4551 Severe persistent asthma with (acute) exacerbation: Secondary | ICD-10-CM | POA: Diagnosis present

## 2017-02-13 DIAGNOSIS — Z23 Encounter for immunization: Secondary | ICD-10-CM | POA: Diagnosis not present

## 2017-02-13 DIAGNOSIS — J209 Acute bronchitis, unspecified: Secondary | ICD-10-CM | POA: Diagnosis present

## 2017-02-13 DIAGNOSIS — Z8249 Family history of ischemic heart disease and other diseases of the circulatory system: Secondary | ICD-10-CM | POA: Diagnosis not present

## 2017-02-13 DIAGNOSIS — E876 Hypokalemia: Secondary | ICD-10-CM | POA: Diagnosis present

## 2017-02-13 DIAGNOSIS — J45909 Unspecified asthma, uncomplicated: Secondary | ICD-10-CM

## 2017-02-13 DIAGNOSIS — Z77120 Contact with and (suspected) exposure to mold (toxic): Secondary | ICD-10-CM | POA: Diagnosis not present

## 2017-02-13 DIAGNOSIS — J45901 Unspecified asthma with (acute) exacerbation: Secondary | ICD-10-CM

## 2017-02-13 DIAGNOSIS — Z885 Allergy status to narcotic agent status: Secondary | ICD-10-CM | POA: Diagnosis not present

## 2017-02-13 DIAGNOSIS — J4550 Severe persistent asthma, uncomplicated: Secondary | ICD-10-CM | POA: Diagnosis present

## 2017-02-13 DIAGNOSIS — T380X5A Adverse effect of glucocorticoids and synthetic analogues, initial encounter: Secondary | ICD-10-CM | POA: Diagnosis not present

## 2017-02-13 DIAGNOSIS — K5900 Constipation, unspecified: Secondary | ICD-10-CM | POA: Diagnosis not present

## 2017-02-13 LAB — BASIC METABOLIC PANEL
ANION GAP: 10 (ref 5–15)
BUN: 8 mg/dL (ref 6–20)
CALCIUM: 9 mg/dL (ref 8.9–10.3)
CO2: 23 mmol/L (ref 22–32)
Chloride: 108 mmol/L (ref 101–111)
Creatinine, Ser: 0.64 mg/dL (ref 0.44–1.00)
GLUCOSE: 126 mg/dL — AB (ref 65–99)
Potassium: 3.1 mmol/L — ABNORMAL LOW (ref 3.5–5.1)
SODIUM: 141 mmol/L (ref 135–145)

## 2017-02-13 LAB — CBC WITH DIFFERENTIAL/PLATELET
BASOS ABS: 0 10*3/uL (ref 0.0–0.1)
BASOS PCT: 0 %
EOS PCT: 2 %
Eosinophils Absolute: 0.3 10*3/uL (ref 0.0–0.7)
HCT: 35.5 % — ABNORMAL LOW (ref 36.0–46.0)
Hemoglobin: 12.2 g/dL (ref 12.0–15.0)
Lymphocytes Relative: 14 %
Lymphs Abs: 2.2 10*3/uL (ref 0.7–4.0)
MCH: 32.7 pg (ref 26.0–34.0)
MCHC: 34.4 g/dL (ref 30.0–36.0)
MCV: 95.2 fL (ref 78.0–100.0)
MONO ABS: 0.3 10*3/uL (ref 0.1–1.0)
Monocytes Relative: 2 %
Neutro Abs: 13.1 10*3/uL — ABNORMAL HIGH (ref 1.7–7.7)
Neutrophils Relative %: 82 %
PLATELETS: 271 10*3/uL (ref 150–400)
RBC: 3.73 MIL/uL — AB (ref 3.87–5.11)
RDW: 13 % (ref 11.5–15.5)
WBC: 15.8 10*3/uL — AB (ref 4.0–10.5)

## 2017-02-13 LAB — MAGNESIUM: MAGNESIUM: 1.8 mg/dL (ref 1.7–2.4)

## 2017-02-13 LAB — MRSA PCR SCREENING: MRSA by PCR: NEGATIVE

## 2017-02-13 LAB — PHOSPHORUS: Phosphorus: 2.6 mg/dL (ref 2.5–4.6)

## 2017-02-13 MED ORDER — ONDANSETRON HCL 4 MG PO TABS: 4.0000 mg | ORAL_TABLET | Freq: Four times a day (QID) | ORAL | Status: DC | PRN

## 2017-02-13 MED ORDER — POTASSIUM CHLORIDE CRYS ER 20 MEQ PO TBCR
40.0000 meq | EXTENDED_RELEASE_TABLET | Freq: Once | ORAL | Status: AC
  Administered 2017-02-13: 40 meq via ORAL
  Filled 2017-02-13: qty 2

## 2017-02-13 MED ORDER — ORAL CARE MOUTH RINSE
15.0000 mL | Freq: Two times a day (BID) | OROMUCOSAL | Status: DC
  Administered 2017-02-13 – 2017-02-14 (×2): 15 mL via OROMUCOSAL

## 2017-02-13 MED ORDER — IPRATROPIUM-ALBUTEROL 0.5-2.5 (3) MG/3ML IN SOLN
3.0000 mL | Freq: Three times a day (TID) | RESPIRATORY_TRACT | Status: DC
  Administered 2017-02-14 – 2017-02-15 (×5): 3 mL via RESPIRATORY_TRACT
  Filled 2017-02-13 (×5): qty 3

## 2017-02-13 MED ORDER — GUAIFENESIN ER 600 MG PO TB12
600.0000 mg | ORAL_TABLET | Freq: Two times a day (BID) | ORAL | Status: DC
  Administered 2017-02-13 – 2017-02-15 (×5): 600 mg via ORAL
  Filled 2017-02-13 (×5): qty 1

## 2017-02-13 MED ORDER — DIPHENHYDRAMINE HCL 25 MG PO CAPS
25.0000 mg | ORAL_CAPSULE | Freq: Every evening | ORAL | Status: DC | PRN
  Administered 2017-02-13: 25 mg via ORAL
  Filled 2017-02-13: qty 1

## 2017-02-13 MED ORDER — ALBUTEROL SULFATE (2.5 MG/3ML) 0.083% IN NEBU
5.0000 mg | INHALATION_SOLUTION | Freq: Once | RESPIRATORY_TRACT | Status: AC
  Administered 2017-02-13: 5 mg via RESPIRATORY_TRACT
  Filled 2017-02-13: qty 6

## 2017-02-13 MED ORDER — ONDANSETRON HCL 4 MG/2ML IJ SOLN: 4.0000 mg | Freq: Four times a day (QID) | INTRAMUSCULAR | Status: DC | PRN

## 2017-02-13 MED ORDER — MAGNESIUM SULFATE 50 % IJ SOLN: 2.0000 g | Freq: Once | INTRAMUSCULAR | Status: DC

## 2017-02-13 MED ORDER — PHENYLEPH-DOXYLAMINE-DM-APAP 5-6.25-10-325 MG/15ML PO LIQD: 30.0000 mL | Freq: Four times a day (QID) | ORAL | Status: DC | PRN

## 2017-02-13 MED ORDER — METHYLPREDNISOLONE SODIUM SUCC 125 MG IJ SOLR
60.0000 mg | Freq: Four times a day (QID) | INTRAMUSCULAR | Status: DC
  Administered 2017-02-13 – 2017-02-14 (×5): 60 mg via INTRAVENOUS
  Filled 2017-02-13 (×5): qty 2

## 2017-02-13 MED ORDER — ALBUTEROL SULFATE (2.5 MG/3ML) 0.083% IN NEBU: 2.5000 mg | INHALATION_SOLUTION | Freq: Four times a day (QID) | RESPIRATORY_TRACT | Status: DC

## 2017-02-13 MED ORDER — INFLUENZA VAC SPLIT QUAD 0.5 ML IM SUSY
0.5000 mL | PREFILLED_SYRINGE | INTRAMUSCULAR | Status: AC
  Administered 2017-02-15: 0.5 mL via INTRAMUSCULAR
  Filled 2017-02-13: qty 0.5

## 2017-02-13 MED ORDER — LORAZEPAM 2 MG/ML IJ SOLN: 1.0000 mg | Freq: Once | INTRAMUSCULAR | Status: DC | PRN

## 2017-02-13 MED ORDER — ENOXAPARIN SODIUM 40 MG/0.4ML ~~LOC~~ SOLN
40.0000 mg | SUBCUTANEOUS | Status: DC
  Administered 2017-02-13 – 2017-02-15 (×3): 40 mg via SUBCUTANEOUS
  Filled 2017-02-13 (×4): qty 0.4

## 2017-02-13 MED ORDER — MAGNESIUM SULFATE 2 GM/50ML IV SOLN
2.0000 g | Freq: Once | INTRAVENOUS | Status: AC
  Administered 2017-02-13: 2 g via INTRAVENOUS
  Filled 2017-02-13: qty 50

## 2017-02-13 MED ORDER — METHYLPREDNISOLONE SODIUM SUCC 125 MG IJ SOLR
80.0000 mg | Freq: Four times a day (QID) | INTRAMUSCULAR | Status: DC
  Administered 2017-02-13: 80 mg via INTRAVENOUS
  Filled 2017-02-13: qty 2

## 2017-02-13 MED ORDER — PNEUMOCOCCAL VAC POLYVALENT 25 MCG/0.5ML IJ INJ
0.5000 mL | INJECTION | INTRAMUSCULAR | Status: DC
  Filled 2017-02-13: qty 0.5

## 2017-02-13 MED ORDER — POTASSIUM CHLORIDE IN NACL 20-0.45 MEQ/L-% IV SOLN
INTRAVENOUS | Status: DC
  Administered 2017-02-13: 02:00:00 via INTRAVENOUS
  Filled 2017-02-13: qty 1000

## 2017-02-13 MED ORDER — KETOROLAC TROMETHAMINE 30 MG/ML IJ SOLN
30.0000 mg | Freq: Once | INTRAMUSCULAR | Status: AC
  Administered 2017-02-13: 30 mg via INTRAVENOUS
  Filled 2017-02-13: qty 1

## 2017-02-13 MED ORDER — ALBUTEROL SULFATE (2.5 MG/3ML) 0.083% IN NEBU: 2.5000 mg | INHALATION_SOLUTION | RESPIRATORY_TRACT | Status: DC | PRN

## 2017-02-13 MED ORDER — IPRATROPIUM-ALBUTEROL 0.5-2.5 (3) MG/3ML IN SOLN
3.0000 mL | Freq: Four times a day (QID) | RESPIRATORY_TRACT | Status: DC
  Administered 2017-02-13: 3 mL via RESPIRATORY_TRACT
  Filled 2017-02-13: qty 3

## 2017-02-13 MED ORDER — IPRATROPIUM-ALBUTEROL 0.5-2.5 (3) MG/3ML IN SOLN
3.0000 mL | RESPIRATORY_TRACT | Status: DC
  Administered 2017-02-13 (×3): 3 mL via RESPIRATORY_TRACT
  Filled 2017-02-13 (×3): qty 3

## 2017-02-13 MED ORDER — LEVOFLOXACIN IN D5W 750 MG/150ML IV SOLN
750.0000 mg | Freq: Once | INTRAVENOUS | Status: AC
  Administered 2017-02-13: 750 mg via INTRAVENOUS
  Filled 2017-02-13: qty 150

## 2017-02-13 MED ORDER — LEVOFLOXACIN 750 MG PO TABS
750.0000 mg | ORAL_TABLET | Freq: Every day | ORAL | Status: DC
  Administered 2017-02-13 – 2017-02-14 (×2): 750 mg via ORAL
  Filled 2017-02-13 (×2): qty 1

## 2017-02-13 MED ORDER — BUDESONIDE 0.25 MG/2ML IN SUSP: 0.2500 mg | Freq: Two times a day (BID) | RESPIRATORY_TRACT | Status: DC

## 2017-02-13 MED ORDER — SODIUM CHLORIDE 0.9 % IV SOLN
INTRAVENOUS | Status: DC
  Administered 2017-02-13 – 2017-02-14 (×3): via INTRAVENOUS

## 2017-02-13 MED ORDER — METHYLPREDNISOLONE SODIUM SUCC 125 MG IJ SOLR
125.0000 mg | Freq: Once | INTRAMUSCULAR | Status: AC
  Administered 2017-02-13: 125 mg via INTRAVENOUS
  Filled 2017-02-13: qty 2

## 2017-02-13 MED ORDER — IPRATROPIUM BROMIDE 0.02 % IN SOLN
1.0000 mg | Freq: Once | RESPIRATORY_TRACT | Status: AC
  Administered 2017-02-13: 1 mg via RESPIRATORY_TRACT
  Filled 2017-02-13: qty 5

## 2017-02-13 MED ORDER — IPRATROPIUM BROMIDE 0.02 % IN SOLN: 0.5000 mg | Freq: Four times a day (QID) | RESPIRATORY_TRACT | Status: DC

## 2017-02-13 NOTE — Consult Note (Signed)
[facility] Consult  Physician Requesting Consult:  Hartley Barefoot, M.D. / [facility]  Date of Consult:  02/13/2017  Reason for Consult/Chief Complaint:  Acute Asthma Exacerbation  History of Presenting Illness:  26 y.o. female previously diagnosed with asthma. Patient has been intubated in the pastduring previous asthma exacerbation. In the emergency department the patient was administered a 15 mg continuous albuterol nebulizer treatment as well as Solu-Medrol 125 mg IV & magnesium sulfate 2 g IV.  Reviewing the patient's electronic medical record shows she was in the emergency department on 9/12 with a 2 day history of gradually progressive shortness of breath. At that time patient denied any chest pain.symptoms have not improved with albuterol treatment at home. Patient was discharged with albuterol via nebulizer as well as an albuterol rescue inhaler and a prednisone taper. Patient reports her dyspnea and symptoms markedly improved after her prednisone taper for approximately 1-1/2 weeks. Then she began to experience progressively worsening chest tightness and difficulty breathing. She reports over the last 3 days she has had hot flashes as well as sweats. She is also experienced increased sinus drainage and a sore throat over the last 3 days. She denies any sick contacts but has not yet had the influenza vaccine. She reports her cough is producing a pink tinged and green mucus at times. Patient also endorses diarrhea with yellow stool but denies any nausea or vomiting. She does endorse some aching in her chest but is unsure if this is secondary to her cough or not. She denies having any inhalers or nebulizer medications at home. She has never seen a pulmonologist. She denies any recent travel. Does have mold exposure in her apartment as well as carpet exposure.  Review of Systems:  No rashes or abnormal bruising. No dysuria or hematuria. A pertinent 14 point review of systems is  negative except as per the history of presenting illness.  Allergies  Allergen Reactions  . Oxycodone Anaphylaxis, Shortness Of Breath and Swelling  . Watermelon Flavor Swelling and Other (See Comments)    Swelling of tongue and lips break out    No current facility-administered medications on file prior to encounter.    Current Outpatient Prescriptions on File Prior to Encounter  Medication Sig Dispense Refill  . ibuprofen (ADVIL,MOTRIN) 600 MG tablet Take 1 tablet (600 mg total) by mouth every 6 (six) hours as needed. (Patient not taking: Reported on 02/13/2017) 30 tablet 0  . methocarbamol (ROBAXIN) 500 MG tablet Take 1 tablet (500 mg total) by mouth 2 (two) times daily. (Patient not taking: Reported on 02/13/2017) 20 tablet 0  . ondansetron (ZOFRAN ODT) 8 MG disintegrating tablet  ODT q4 hours prn nausea (Patient not taking: Reported on 02/13/2017) 6 tablet 0  . predniSONE (STERAPRED UNI-PAK 21 TAB) 10 MG (21) TBPK tablet 6,5,4,3,2,1 taper (Patient not taking: Reported on 02/13/2017) 21 tablet 0    Past Medical History:  Diagnosis Date  . Asthma     History reviewed. No pertinent surgical history.  Family History  Problem Relation Age of Onset  . Hypertension Mother   . Asthma Mother   . Seizures Father   . Hypertension Father   . Congestive Heart Failure Paternal Grandfather   . Asthma Sister   . Emphysema Maternal Grandmother   . Asthma Sister   . Multiple sclerosis Paternal Aunt     Social History   Social History  . Marital status: Single    Spouse name: N/A  . Number of  children: N/A  . Years of education: N/A   Social History Main Topics  . Smoking status: Former Smoker    Types: Cigarettes  . Smokeless tobacco: Never Used     Comment: Smoked for 1 month total.   . Alcohol use No  . Drug use: No  . Sexual activity: Not Currently     Comment: "with a woman"   Other Topics Concern  . None   Social History Narrative   [facility] (02/13/17):    Patient denies any pets currently. Denies any bird exposure. No recent travel. No indoor plants. Does have carpet in her apartment. She reports there is mold around the vents in her bedroom. She has been sleeping in the living room with this mold visible.    Temp:  [98.6 F (37 C)] 98.6 F (37 C) (09/28 2146) Pulse Rate:  [97-113] 97 (09/29 0755) Resp:  [18-24] 18 (09/29 0755) BP: (133-145)/(86-101) 133/86 (09/29 0542) SpO2:  [98 %-99 %] 99 % (09/29 0755)  General:  Awake. Alert. No acute distress. Watching TV. Talking on the phone. Obese.  Integument:  Warm & dry. No rash or bruising on exposed skin.  Extremities:  No cyanosis or clubbing.  Lymphatics:  No appreciated cervical or supraclavicular lymphadenoapthy. HEENT:  Moist mucus membranes. No oral ulcers. No scleral injection or icterus.  Cardiovascular:  Regular rate & rhythm. No edema. No JVD appreciated.  Pulmonary:  Good aeration in the bases. Wheezing noted bilaterally. Speaking in complete sentences with normal work of breathing. Abdomen: Soft. Normal bowel sounds. Protuberant. Nontender. Musculoskeletal:  Normal bulk and tone. No joint deformity or effusion appreciated. Neurological:  Cranial nerves 2-12 grossly in tact. No meningismus. Moving all 4 extremities equally.  Psychiatric:  Mood and affect congruent. Speech normal rhythm, rate & tone.   CBC Latest Ref Rng & Units 02/13/2017 10/16/2016 01/09/2016  WBC 4.0 - 10.5 K/uL 15.8(H) 9.2 11.8(H)  Hemoglobin 12.0 - 15.0 g/dL 45.4 09.8 11.9  Hematocrit 36.0 - 46.0 % 35.5(L) 39.3 38.7  Platelets 150 - 400 K/uL 271 337 336   BMP Latest Ref Rng & Units 02/13/2017 10/16/2016 01/09/2016  Glucose 65 - 99 mg/dL 147(W) 295(A) 91  BUN 6 - 20 mg/dL Creatinine 0.44 - 1.00 mg/dL 2.13 0.86 5.78  Sodium 135 - 145 mmol/L 141 137 138  Potassium 3.5 - 5.1 mmol/L 3.1(L) 3.8 3.9  Chloride 101 - 111 mmol/L 108 106 107  CO2 22 - 32 mmol/L 23 21(L) 26  Calcium 8.9 - 10.3 mg/dL 9.0 9.0 9.0     IMAGING/STUDIES: CXR PA/LAT 9/29:  Personally reviewed by me. No focal opacity. Heart normal in size. Mediastinum normal in contour. No pleural effusion. ABG on RA 9/29:  7.42 / 29.9 / 72.5 / 94%  MICROBIOLOGY: None.  ANTIBIOTICS: Levaquin 9/29 >>>  ASSESSMENT/PLAN:  26 y.o. female previously diagnosed with asthma presenting with symptoms consistent with an acute exacerbation. Possible etiologies for her decompensation include an acute infectious etiology with possible viral prodromeas well as mold exposure in her apartment. Not currently taking any asthma controller medications as an outpatient.  1. Asthma with acute exacerbation: Continuing Duonebs every 4 hours scheduled & Solu-Medrol 60 mg IV every 6 hours. Holding on nebulized Pulmicort & other asthma controller medications. Patient will need to be discharged on proper inhalers. 2. Probable acute bronchitis: Droplet precautions. Checking stat respiratory viral panel PCR. Agree with Levaquin empiric treatment. Checking sputum culture for routine bacteria.  Remainder of care  as per admitting physician and primary service.  Donna Christen Jamison Neighbor, M.D. [facility] Pager:  [phone] After 3pm or if no response, call [phone] 9:25 AM 02/13/17

## 2017-02-13 NOTE — H&P (Signed)
History and Physical    Paula Wiggins AVW:[id number] DOB: Jul 02, 1990 DOA: 02/12/2017  PCP: Patient, No Pcp Per   Patient coming from: Home.  I have personally briefly reviewed patient's old medical records in [facility]  Chief Complaint: Shortness of breath.  HPI: Paula Wiggins is a 26 y.o. female with medical history significant of chronic persistent asthma who is coming to the emergency department with complaints of progressively worse dyspnea for the past 3 days associated with mostly dry cough which is occasionally productive, pleuritic chest pain, palpitations which has not responded to her home use of albuterol MDI and nebulizer solution. She denies fever, but complains of chills and fatigue. She denies dizziness, diaphoresis, pitting edema of the lower extremities, abdominal pain, nausea, emesis, diarrhea or constipation. Denies dysuria, frequency or hematuria. No polyuria, polydipsia or blurred vision. She mentions that she has had admissions in the past for asthma exacerbation, including to the ICU. She believes that she may have been intubated when she was a child.  ED Course: Initial vital signs in the emergency department were temperature 98.31F, pulse 113, respirations 24, blood pressure 145/101 mmHg and O2 sat 99% on room air. She was given 15 mg total on nebulized albuterol and supplemental oxygen. Solu-Medrol 125 mg IVPB 1 and magnesium sulfate 2 g IVPB.  Her workup shows an ABG with a pH of 7.42, PCO2 of 29.9, PCO2 of 72.5, base excess is 19.1 and O2 sat 93.5% on room air. WBC was 15.8 with 82% neutrophils, hemoglobin 12.2 g/dL and platelets 829. Her BMP showed a potassium level 3.1 mmol/L and a glucose level of 126 mg/dL, all other values were normal. Her phosphorus level was 2.6 and magnesium 1.8 mg/dL. Her chest radiograph did not show any acute cardiopulmonary pathology.  Review of Systems: As per HPI otherwise 10 point review of systems negative.    Past  Medical History:  Diagnosis Date  . Asthma     History reviewed. No pertinent surgical history.   reports that she has quit smoking. Her smoking use included Cigarettes. She has never used smokeless tobacco. She reports that she does not drink alcohol or use drugs.  Allergies  Allergen Reactions  . Oxycodone Anaphylaxis, Shortness Of Breath and Swelling  . Watermelon Flavor Swelling and Other (See Comments)    Swelling of tongue and lips break out    Family History  Problem Relation Age of Onset  . Hypertension Mother   . Seizures Father   . Hypertension Father   . Congestive Heart Failure Paternal Grandfather     Prior to Admission medications   Medication Sig Start Date End Date Taking? Authorizing Provider  ibuprofen (ADVIL,MOTRIN) 200 MG tablet Take 400 mg by mouth every 6 (six) hours as needed for headache, mild pain or moderate pain.   Yes [provider]  ibuprofen (ADVIL,MOTRIN) 600 MG tablet Take 1 tablet (600 mg total) by mouth every 6 (six) hours as needed. Patient not taking: Reported on 02/13/2017 12/05/16   Georgiana Shore, PA-C  methocarbamol (ROBAXIN) 500 MG tablet Take 1 tablet (500 mg total) by mouth 2 (two) times daily. Patient not taking: Reported on 02/13/2017 12/25/16   Eyvonne Mechanic, PA-C  ondansetron (ZOFRAN ODT) 8 MG disintegrating tablet  ODT q4 hours prn nausea Patient not taking: Reported on 02/13/2017 10/16/16   Geoffery Lyons, MD  predniSONE (STERAPRED UNI-PAK 21 TAB) 10 MG (21) TBPK tablet 6,5,4,3,2,1 taper Patient not taking: Reported on 02/13/2017 01/27/17   Elson Areas,  PA-C    Physical Exam: Vitals:   02/12/17 2146 02/12/17 2207 02/12/17 2239 02/13/17 0152  BP: (!) 145/101     Pulse: (!) 113     Resp: (!) 24     Temp: 98.6 F (37 C)     TempSrc: Oral     SpO2: 99% 98% 99% 98%    Constitutional: Mildly anxious with mild respiratory distress. Eyes: PERRL, lids and conjunctivae normal ENMT: Mucous membranes are moist.  Posterior pharynx clear of any exudate or lesions. Neck: normal, supple, no masses, no thyromegaly Respiratory: Decreased breath sounds bilaterally with mild rhonchi and bilateral expiratory wheezing, no crackles. Mildly tachypneic. No accessory muscle use.  Cardiovascular: Tachycardic at 112 bpm, no murmurs / rubs / gallops. No extremity edema. 2+ pedal pulses. No carotid bruits.  Abdomen: Soft, no tenderness, no masses palpated. No hepatosplenomegaly. Bowel sounds positive.  Musculoskeletal: no clubbing / cyanosis. No joint deformity upper and lower extremities. Good ROM, no contractures. Normal muscle tone.  Skin: No significant rashes, lesions, ulcers on limited skin exam. Neurologic: Mild basal tremor from bronchodilators. CN 2-12 grossly intact. Sensation intact, DTR normal. Strength 5/5 in all 4.  Psychiatric: Normal judgment and insight. Alert and oriented x 4. Mildly anxious mood.    Labs on Admission: I have personally reviewed following labs and imaging studies  CBC:  Recent Labs Lab 02/13/17 0045  WBC 15.8*  NEUTROABS 13.1*  HGB 12.2  HCT 35.5*  MCV 95.2  PLT 271   Basic Metabolic Panel:  Recent Labs Lab 02/13/17 0045 02/13/17 0125  NA 141  --   K 3.1*  --   CL 108  --   CO2 23  --   GLUCOSE 126*  --   BUN 8  --   CREATININE 0.64  --   CALCIUM 9.0  --   MG  --  1.8  PHOS  --  2.6   GFR: CrCl cannot be calculated (Unknown ideal weight.). Liver Function Tests: No results for input(s): AST, ALT, ALKPHOS, BILITOT, PROT, ALBUMIN in the last 168 hours. No results for input(s): LIPASE, AMYLASE in the last 168 hours. No results for input(s): AMMONIA in the last 168 hours. Coagulation Profile: No results for input(s): INR, PROTIME in the last 168 hours. Cardiac Enzymes: No results for input(s): CKTOTAL, CKMB, CKMBINDEX, TROPONINI in the last 168 hours. BNP (last 3 results) No results for input(s): PROBNP in the last 8760 hours. HbA1C: No results for  input(s): HGBA1C in the last 72 hours. CBG: No results for input(s): GLUCAP in the last 168 hours. Lipid Profile: No results for input(s): CHOL, HDL, LDLCALC, TRIG, CHOLHDL, LDLDIRECT in the last 72 hours. Thyroid Function Tests: No results for input(s): TSH, T4TOTAL, FREET4, T3FREE, THYROIDAB in the last 72 hours. Anemia Panel: No results for input(s): VITAMINB12, FOLATE, FERRITIN, TIBC, IRON, RETICCTPCT in the last 72 hours. Urine analysis:    Component Value Date/Time   COLORURINE YELLOW 10/16/2016 1201   APPEARANCEUR CLEAR 10/16/2016 1201   LABSPEC 1.010 10/16/2016 1201   PHURINE 7.0 10/16/2016 1201   GLUCOSEU NEGATIVE 10/16/2016 1201   HGBUR NEGATIVE 10/16/2016 1201   BILIRUBINUR NEGATIVE 10/16/2016 1201   KETONESUR NEGATIVE 10/16/2016 1201   PROTEINUR NEGATIVE 10/16/2016 1201   UROBILINOGEN 0.2 10/10/2012 2237   NITRITE NEGATIVE 10/16/2016 1201   LEUKOCYTESUR LARGE (A) 10/16/2016 1201    Radiological Exams on Admission: Dg Chest 2 View  Result Date: 02/12/2017 CLINICAL DATA:  Shortness of breath EXAM: CHEST  2 VIEW  COMPARISON:  Chest radiograph 01/27/2017 FINDINGS: The heart size and mediastinal contours are within normal limits. Both lungs are clear. The visualized skeletal structures are unremarkable. IMPRESSION: No active cardiopulmonary disease. Electronically Signed   By: Deatra Robinson M.D.   On: 02/12/2017 22:09    EKG: Independently reviewed. Vent. rate 107 BPM PR interval * ms QRS duration 80 ms QT/QTc 307/410 ms P-R-T axes 83 82 3 Sinus tachycardia Borderline repolarization abnormality  Assessment/Plan Principal Problem:   Asthma exacerbation Observation/stepdown. Continue supplemental oxygen. Continue scheduled and as needed bronchodilators. We will increase Solu-Medrol loading dose to 250 mg. Continue Solu-Medrol 80 mg IVP every 6 hours. BiPAP ventilation as needed. Given frequent exacerbations, I strongly suggested to the patient to see a  pulmonologist at her earliest convenience for PFTs and maintenance treatment.  Active Problems:   Acute bronchitis  Continue asthma exacerbation treatment. Guaifenesin 600 mg by mouth twice a day. Levaquin 750 mg single dose IV. Levaquin 750 mg by mouth daily starting tomorrow.    Hypokalemia Likely due to beta agonist. Replacing. Follow-up potassium level. Magnesium sulfate supplementation was given.    DVT prophylaxis: Lovenox SQ. Code Status: Full code. Family Communication:  Disposition Plan: Admit for asthma exacerbation treatment. Consults called:  Admission status: Observation/stepdown.   Bobette Mo MD [facility] Pager [phone]  If 7PM-7AM, please contact night-coverage [email or web address] Password [facility]  02/13/2017, 2:38 AM

## 2017-02-13 NOTE — Progress Notes (Signed)
PROGRESS NOTE    Paula Wiggins  ZOX:[id number] DOB: 20-Apr-1991 DOA: 02/12/2017 PCP: Patient, No Pcp Per    Brief Narrative:  Paula Wiggins is a 26 y.o. female with medical history significant of chronic persistent asthma who is coming to the emergency department with complaints of progressively worse dyspnea for the past 3 days associated with mostly dry cough which is occasionally productive, pleuritic chest pain, palpitations which has not responded to her home use of albuterol MDI and nebulizer solution.  She mentions that she has had admissions in the past for asthma exacerbation, including to the ICU. She believes that she may have been intubated when she was a child.  ED Course: Initial vital signs in the emergency department were temperature 98.68F, pulse 113, respirations 24, blood pressure 145/101 mmHg and O2 sat 99% on room air. She was given 15 mg total on nebulized albuterol and supplemental oxygen. Solu-Medrol 125 mg IVPB 1 and magnesium sulfate 2 g IVPB.  Her workup shows an ABG with a pH of 7.42, PCO2 of 29.9, PCO2 of 72.5, base excess is 19.1 and O2 sat 93.5% on room air. WBC was 15.8 with 82% neutrophils, hemoglobin 12.2 g/dL and platelets 811. Her BMP showed a potassium level 3.1 mmol/L and a glucose level of 126 mg/dL, all other values were normal. Her phosphorus level was 2.6 and magnesium 1.8 mg/dL. Her chest radiograph did not show any acute cardiopulmonary pathology.  Assessment & Plan:   Principal Problem:   Asthma exacerbation Active Problems:   Acute bronchitis   Hypokalemia   1-Acute asthma exacerbation;  Patient speaks in full sentence , tachypnea, poor air movement.  IV solumedrol 80 mg Q 6 hours.   IV magnesium.  Continue with schedule nebulizer.  Add Pulmicort.  Pulmonary consulted.   Hypokalemia; replete orally.     DVT prophylaxis: lovenox Code Status: full code.  Family Communication: care discussed with patient  Disposition Plan: admit  to step down unit    Consultants:   Pulmonary    Procedures:   none   Antimicrobials: Levaquin 9-29   Subjective: Patient still with SOB, at rest and on exertion. She has chronic SOB on exertion.    Objective: Vitals:   02/12/17 2239 02/13/17 0152 02/13/17 0542 02/13/17 0755  BP:   133/86   Pulse:   (!) 111 97  Resp:   (!) 23 18  Temp:      TempSrc:      SpO2: 99% 98% 98% 99%   No intake or output data in the 24 hours ending 02/13/17 0809 There were no vitals filed for this visit.  Examination:  General exam: Appears calm and comfortable  Respiratory system: tachypnea, sporadic wheezing bilateral, decrease breath sounds.  Cardiovascular system: S1 & S2 heard, RRR. No JVD, murmurs, rubs, gallops or clicks. No pedal edema. Gastrointestinal system: Abdomen is nondistended, soft and nontender. No organomegaly or masses felt. Normal bowel sounds heard. Central nervous system: Alert and oriented. No focal neurological deficits. Extremities: Symmetric 5 x 5 power. Skin: No rashes, lesions or ulcers   Data Reviewed: I have personally reviewed following labs and imaging studies  CBC:  Recent Labs Lab 02/13/17 0045  WBC 15.8*  NEUTROABS 13.1*  HGB 12.2  HCT 35.5*  MCV 95.2  PLT 271   Basic Metabolic Panel:  Recent Labs Lab 02/13/17 0045 02/13/17 0125  NA 141  --   K 3.1*  --   CL 108  --   CO2 23  --  GLUCOSE 126*  --   BUN 8  --   CREATININE 0.64  --   CALCIUM 9.0  --   MG  --  1.8  PHOS  --  2.6   GFR: CrCl cannot be calculated (Unknown ideal weight.). Liver Function Tests: No results for input(s): AST, ALT, ALKPHOS, BILITOT, PROT, ALBUMIN in the last 168 hours. No results for input(s): LIPASE, AMYLASE in the last 168 hours. No results for input(s): AMMONIA in the last 168 hours. Coagulation Profile: No results for input(s): INR, PROTIME in the last 168 hours. Cardiac Enzymes: No results for input(s): CKTOTAL, CKMB, CKMBINDEX, TROPONINI in  the last 168 hours. BNP (last 3 results) No results for input(s): PROBNP in the last 8760 hours. HbA1C: No results for input(s): HGBA1C in the last 72 hours. CBG: No results for input(s): GLUCAP in the last 168 hours. Lipid Profile: No results for input(s): CHOL, HDL, LDLCALC, TRIG, CHOLHDL, LDLDIRECT in the last 72 hours. Thyroid Function Tests: No results for input(s): TSH, T4TOTAL, FREET4, T3FREE, THYROIDAB in the last 72 hours. Anemia Panel: No results for input(s): VITAMINB12, FOLATE, FERRITIN, TIBC, IRON, RETICCTPCT in the last 72 hours. Sepsis Labs: No results for input(s): PROCALCITON, LATICACIDVEN in the last 168 hours.  No results found for this or any previous visit (from the past 240 hour(s)).       Radiology Studies: Dg Chest 2 View  Result Date: 02/12/2017 CLINICAL DATA:  Shortness of breath EXAM: CHEST  2 VIEW COMPARISON:  Chest radiograph 01/27/2017 FINDINGS: The heart size and mediastinal contours are within normal limits. Both lungs are clear. The visualized skeletal structures are unremarkable. IMPRESSION: No active cardiopulmonary disease. Electronically Signed   By: Deatra Robinson M.D.   On: 02/12/2017 22:09        Scheduled Meds: . budesonide (PULMICORT) nebulizer solution  0.25 mg Nebulization BID  . enoxaparin (LOVENOX) injection  40 mg Subcutaneous Q24H  . guaiFENesin  600 mg Oral BID  . ipratropium-albuterol  3 mL Nebulization Q6H  . levofloxacin  750 mg Oral QHS  . methylPREDNISolone (SOLU-MEDROL) injection  80 mg Intravenous Q6H   Continuous Infusions: . sodium chloride    . magnesium sulfate 1 - 4 g bolus IVPB       LOS: 0 days    Time spent: 35 minutes.     Alba Cory, MD [facility] Pager [phone]  If 7PM-7AM, please contact night-coverage [email or web address] Password [email or web address] 02/13/2017, 8:09 AM

## 2017-02-14 DIAGNOSIS — E876 Hypokalemia: Secondary | ICD-10-CM

## 2017-02-14 LAB — RESPIRATORY PANEL BY PCR
ADENOVIRUS-RVPPCR: NOT DETECTED
BORDETELLA PERTUSSIS-RVPCR: NOT DETECTED
CHLAMYDOPHILA PNEUMONIAE-RVPPCR: NOT DETECTED
Coronavirus 229E: NOT DETECTED
Coronavirus HKU1: NOT DETECTED
Coronavirus NL63: NOT DETECTED
Coronavirus OC43: NOT DETECTED
Influenza A: NOT DETECTED
Influenza B: NOT DETECTED
MYCOPLASMA PNEUMONIAE-RVPPCR: NOT DETECTED
Metapneumovirus: NOT DETECTED
PARAINFLUENZA VIRUS 3-RVPPCR: NOT DETECTED
Parainfluenza Virus 1: NOT DETECTED
Parainfluenza Virus 2: NOT DETECTED
Parainfluenza Virus 4: NOT DETECTED
RHINOVIRUS / ENTEROVIRUS - RVPPCR: NOT DETECTED
Respiratory Syncytial Virus: NOT DETECTED

## 2017-02-14 LAB — BASIC METABOLIC PANEL
Anion gap: 8 (ref 5–15)
BUN: 10 mg/dL (ref 6–20)
CALCIUM: 8.9 mg/dL (ref 8.9–10.3)
CHLORIDE: 110 mmol/L (ref 101–111)
CO2: 22 mmol/L (ref 22–32)
CREATININE: 0.69 mg/dL (ref 0.44–1.00)
GFR calc non Af Amer: >60 mL/min (ref >60)
GLUCOSE: 137 mg/dL — AB (ref 65–99)
Potassium: 4.3 mmol/L (ref 3.5–5.1)
Sodium: 140 mmol/L (ref 135–145)

## 2017-02-14 LAB — CBC
HEMATOCRIT: 36.4 % (ref 36.0–46.0)
Hemoglobin: 11.9 g/dL — ABNORMAL LOW (ref 12.0–15.0)
MCH: 31.7 pg (ref 26.0–34.0)
MCHC: 32.7 g/dL (ref 30.0–36.0)
MCV: 97.1 fL (ref 78.0–100.0)
PLATELETS: 307 10*3/uL (ref 150–400)
RBC: 3.75 MIL/uL — ABNORMAL LOW (ref 3.87–5.11)
RDW: 13.8 % (ref 11.5–15.5)
WBC: 27.2 10*3/uL — ABNORMAL HIGH (ref 4.0–10.5)

## 2017-02-14 LAB — EXPECTORATED SPUTUM ASSESSMENT W REFEX TO RESP CULTURE: SPECIAL REQUESTS: NORMAL

## 2017-02-14 LAB — EXPECTORATED SPUTUM ASSESSMENT W GRAM STAIN, RFLX TO RESP C

## 2017-02-14 LAB — HIV ANTIBODY (ROUTINE TESTING W REFLEX): HIV SCREEN 4TH GENERATION: NONREACTIVE

## 2017-02-14 MED ORDER — POLYETHYLENE GLYCOL 3350 17 G PO PACK
17.0000 g | PACK | Freq: Every day | ORAL | Status: DC
  Filled 2017-02-14 (×2): qty 1

## 2017-02-14 MED ORDER — PREDNISONE 20 MG PO TABS
60.0000 mg | ORAL_TABLET | Freq: Every day | ORAL | Status: DC
  Administered 2017-02-15: 60 mg via ORAL
  Filled 2017-02-14: qty 3

## 2017-02-14 NOTE — Progress Notes (Signed)
Pulmonary & Critical Care Consult  Physician Requesting Consult:  Hartley Barefoot, M.D. / [facility]  Date of Consult:  02/13/2017  Reason for Consult/Chief Complaint:  Acute Asthma Exacerbation  History of Presenting Illness:  26 y.o. female previously diagnosed with asthma. Patient has been intubated in the pastduring previous asthma exacerbation. In the emergency department the patient was administered a 15 mg continuous albuterol nebulizer treatment as well as Solu-Medrol 125 mg IV & magnesium sulfate 2 g IV.  Reviewing the patient's electronic medical record shows she was in the emergency department on 9/12 with a 2 day history of gradually progressive shortness of breath. At that time patient denied any chest pain.symptoms have not improved with albuterol treatment at home. Patient was discharged with albuterol via nebulizer as well as an albuterol rescue inhaler and a prednisone taper. Patient reports her dyspnea and symptoms markedly improved after her prednisone taper for approximately 1-1/2 weeks. Then she began to experience progressively worsening chest tightness and difficulty breathing. She reports over the last 3 days she has had hot flashes as well as sweats. She is also experienced increased sinus drainage and a sore throat over the last 3 days. She denies any sick contacts but has not yet had the influenza vaccine. She reports her cough is producing a pink tinged and green mucus at times. Patient also endorses diarrhea with yellow stool but denies any nausea or vomiting. She does endorse some aching in her chest but is unsure if this is secondary to her cough or not. She denies having any inhalers or nebulizer medications at home. She has never seen a pulmonologist. She denies any recent travel. Does have mold exposure in her apartment as well as carpet exposure.  Subjective:  No acute events overnight. Patient feels chest tightness is improving. No new chest pain. Denies any new  subjective fever or chills. Cough slowly improving. Dyspnea slowly improving as well.  Review of Systems:  No abdominal pain or nausea. No headache or vision changes.  Temp:  [98.5 F (36.9 C)-98.8 F (37.1 C)] 98.8 F (37.1 C) (09/30 0019) Pulse Rate:  [84-111] 84 (09/30 0000) Resp:  [15-50] 20 (09/30 0000) BP: (106-155)/(78-87) 155/87 (09/29 1800) SpO2:  [96 %-100 %] 96 % (09/30 0000) Weight:  [109.5 kg (241 lb 6.5 oz)] 109.5 kg (241 lb 6.5 oz) (09/29 1300)  General:  Awake. Alert. No distress. Obese. Watching TV. Integument:  Warm & dry. No rash on exposed skin. No bruising on exposed skin. Tattoo is noted. Extremities:  No cyanosis or clubbing.  HEENT:  Moist mucus membranes. No oral ulcers. No scleral injection or icterus.  Cardiovascular:  Regular rate. No edema. Unable to appreciate JVD.  Pulmonary:  Symmetrically decreased breath sounds. Normal work of breathing on supplemental oxygen. Speaking in complete sentences. Abdomen: Soft. Normal bowel sounds. Protuberant. Musculoskeletal:  Normal bulk and tone. No joint deformity or effusion appreciated.  CBC Latest Ref Rng & Units 02/13/2017 10/16/2016 01/09/2016  WBC 4.0 - 10.5 K/uL 15.8(H) 9.2 11.8(H)  Hemoglobin 12.0 - 15.0 g/dL 81.1 91.4 78.2  Hematocrit 36.0 - 46.0 % 35.5(L) 39.3 38.7  Platelets 150 - 400 K/uL 271 337 336   BMP Latest Ref Rng & Units 02/13/2017 10/16/2016 01/09/2016  Glucose 65 - 99 mg/dL 956(O) 130(Q) 91  BUN 6 - 20 mg/dL Creatinine 0.44 - 1.00 mg/dL 6.57 8.46 9.62  Sodium 135 - 145 mmol/L 141 137 138  Potassium 3.5 - 5.1 mmol/L 3.1(L) 3.8 3.9  Chloride 101 - 111 mmol/L 108 106 107  CO2 22 - 32 mmol/L 23 21(L) 26  Calcium 8.9 - 10.3 mg/dL 9.0 9.0 9.0    IMAGING/STUDIES: CXR PA/LAT 9/29:  Previously reviewed by me. No focal opacity. Heart normal in size. Mediastinum normal in contour. No pleural effusion. ABG on RA 9/29:  7.42 / 29.9 / 72.5 / 94%  MICROBIOLOGY: MRSA PCR 9/29:  Negative Respiratory  Viral Panel PCR 9/29:  Negative   ANTIBIOTICS: Levaquin 9/29 >>>  ASSESSMENT/PLAN:  26 y.o. female with history of previously diagnosed asthma. Currently admitted with acute exacerbation. Viral prodrome with mold exposure as well in her apartment. Not previously on any asthma controller medications and out of albuterol rescue inhaler. Patient's respiratory status continues to stabilize. Symptomatically she is improving. Her respiratory viral panel PCR was negative. Slightly she had an acute bronchitis possibly precipitated by fungal exposure.  1. Asthma with acute exacerbation:  Agree with transition to Duonebs 3 times a day. Switching from Solu-Medrol to prednisone 60 mg daily starting tomorrow for 5 days. Patient needs to be prescribed Advair 250/50 inhaled twice a day upon discharge along with albuterol rescue inhaler prescribed 2 puffs every 4 hours as needed for cough, wheezing, or dyspnea. Patient needs outpatient pulmonary function testing which can be scheduled by her primary care physician. 2. Probable acute bronchitis: No evidence of viral infection. Discontinuing droplet isolation. Recommend continuing Levaquin day #2/5. 3. Fungal exposure: Recommend remediation of her current apartment.  I have spent a total of 36 minutes of time today caring for the patient, reviewing the patient's electronic medical record, and with more than 50% of that time spent coordinating care with the patient as well as reviewing the continuing plan of care with the patient at bedside.  Remainder of care as per admitting physician and primary service. PCCM will sign off at this time. Please let me know if there are any further questions or concerns.   Donna Christen Jamison Neighbor, M.D. [facility] Pulmonary & Critical Care Pager:  [phone] After 3pm or if no response, call [phone] 3:15 AM 02/14/17

## 2017-02-14 NOTE — Progress Notes (Addendum)
PROGRESS NOTE    Paula Wiggins  EAV:[id number] DOB: 06-May-1991 DOA: 02/12/2017 PCP: Patient, No Pcp Per    Brief Narrative:  Paula Wiggins is a 26 y.o. female with medical history significant of chronic persistent asthma who is coming to the emergency department with complaints of progressively worse dyspnea for the past 3 days associated with mostly dry cough which is occasionally productive, pleuritic chest pain, palpitations which has not responded to her home use of albuterol MDI and nebulizer solution.  She mentions that she has had admissions in the past for asthma exacerbation, including to the ICU. She believes that she may have been intubated when she was a child.  ED Course: Initial vital signs in the emergency department were temperature 98.21F, pulse 113, respirations 24, blood pressure 145/101 mmHg and O2 sat 99% on room air. She was given 15 mg total on nebulized albuterol and supplemental oxygen. Solu-Medrol 125 mg IVPB 1 and magnesium sulfate 2 g IVPB.  Her workup shows an ABG with a pH of 7.42, PCO2 of 29.9, PCO2 of 72.5, base excess is 19.1 and O2 sat 93.5% on room air. WBC was 15.8 with 82% neutrophils, hemoglobin 12.2 g/dL and platelets 782. Her BMP showed a potassium level 3.1 mmol/L and a glucose level of 126 mg/dL, all other values were normal. Her phosphorus level was 2.6 and magnesium 1.8 mg/dL. Her chest radiograph did not show any acute cardiopulmonary pathology.  Assessment & Plan:   Principal Problem:   Asthma exacerbation Active Problems:   Acute bronchitis   Hypokalemia   1-Acute asthma exacerbation;  Patient speaks in full sentence , tachypnea, poor air movement.  IV solumedrol 60 mg Q 6 hours.   Received IV magnesium.  Continue with schedule nebulizer.  Pulmonary consulted. Appreciate Dr Jamison Neighbor help./  Continue with Levaquin.   Hypokalemia; resolved.   Constipation; start miralax.   Leukocytosis Related to steroids.   DVT prophylaxis:  lovenox Code Status: full code.  Family Communication: care discussed with patient  Disposition Plan: transfer to med-surgery    Consultants:   Pulmonary    Procedures:   none   Antimicrobials: Levaquin 9-29   Subjective: She is feeling better, dyspnea improved, not at baseline.  No BM since Friday   Objective: Vitals:   02/14/17 0500 02/14/17 0600 02/14/17 0700 02/14/17 0754  BP:   (!) 145/92   Pulse: 78 70 (!) 106   Resp: 19 (!) 27 20   Temp:    98.7 F (37.1 C)  TempSrc:    Oral  SpO2: 100% (!) 85% 100%   Weight:      Height:        Intake/Output Summary (Last 24 hours) at 02/14/17 0831 Last data filed at 02/13/17 1400  Gross per 24 hour  Intake              995 ml  Output                0 ml  Net              995 ml   Filed Weights   02/13/17 1300  Weight: 109.5 kg (241 lb 6.5 oz)    Examination:  General exam: NAD Respiratory system: normal respiratory effort, bilateral air movement, sporadic wheezing.  Cardiovascular system: S 1, S 2, RRR Gastrointestinal system: BS present, soft, nt Central nervous system: non focal.  Extremities: symmetric power.  Skin: No rashes, lesions or ulcers   Data Reviewed: I have personally  reviewed following labs and imaging studies  CBC:  Recent Labs Lab 02/13/17 0045 02/14/17 0406  WBC 15.8* 27.2*  NEUTROABS 13.1*  --   HGB 12.2 11.9*  HCT 35.5* 36.4  MCV 95.2 97.1  PLT 271 307   Basic Metabolic Panel:  Recent Labs Lab 02/13/17 0045 02/13/17 0125 02/14/17 0406  NA 141  --  140  K 3.1*  --  4.3  CL 108  --  110  CO2 23  --  22  GLUCOSE 126*  --  137*  BUN 8  --  10  CREATININE 0.64  --  0.69  CALCIUM 9.0  --  8.9  MG  --  1.8  --   PHOS  --  2.6  --    GFR: Estimated Creatinine Clearance: 126.5 mL/min (by C-G formula based on SCr of 0.69 mg/dL). Liver Function Tests: No results for input(s): AST, ALT, ALKPHOS, BILITOT, PROT, ALBUMIN in the last 168 hours. No results for input(s):  LIPASE, AMYLASE in the last 168 hours. No results for input(s): AMMONIA in the last 168 hours. Coagulation Profile: No results for input(s): INR, PROTIME in the last 168 hours. Cardiac Enzymes: No results for input(s): CKTOTAL, CKMB, CKMBINDEX, TROPONINI in the last 168 hours. BNP (last 3 results) No results for input(s): PROBNP in the last 8760 hours. HbA1C: No results for input(s): HGBA1C in the last 72 hours. CBG: No results for input(s): GLUCAP in the last 168 hours. Lipid Profile: No results for input(s): CHOL, HDL, LDLCALC, TRIG, CHOLHDL, LDLDIRECT in the last 72 hours. Thyroid Function Tests: No results for input(s): TSH, T4TOTAL, FREET4, T3FREE, THYROIDAB in the last 72 hours. Anemia Panel: No results for input(s): VITAMINB12, FOLATE, FERRITIN, TIBC, IRON, RETICCTPCT in the last 72 hours. Sepsis Labs: No results for input(s): PROCALCITON, LATICACIDVEN in the last 168 hours.  Recent Results (from the past 240 hour(s))  MRSA PCR Screening     Status: None   Collection Time: 02/13/17  1:14 PM  Result Value Ref Range Status   MRSA by PCR NEGATIVE NEGATIVE Final    Comment:        The GeneXpert MRSA Assay (FDA approved for NASAL specimens only), is one component of a comprehensive MRSA colonization surveillance program. It is not intended to diagnose MRSA infection nor to guide or monitor treatment for MRSA infections.          Radiology Studies: Dg Chest 2 View  Result Date: 02/12/2017 CLINICAL DATA:  Shortness of breath EXAM: CHEST  2 VIEW COMPARISON:  Chest radiograph 01/27/2017 FINDINGS: The heart size and mediastinal contours are within normal limits. Both lungs are clear. The visualized skeletal structures are unremarkable. IMPRESSION: No active cardiopulmonary disease. Electronically Signed   By: Deatra Robinson M.D.   On: 02/12/2017 22:09        Scheduled Meds: . enoxaparin (LOVENOX) injection  40 mg Subcutaneous Q24H  . guaiFENesin  600 mg Oral BID  .  Influenza vac split quadrivalent PF  0.5 mL Intramuscular Tomorrow-1000  . ipratropium-albuterol  3 mL Nebulization TID  . levofloxacin  750 mg Oral QHS  . mouth rinse  15 mL Mouth Rinse BID  . methylPREDNISolone (SOLU-MEDROL) injection  60 mg Intravenous Q6H  . pneumococcal 23 valent vaccine  0.5 mL Intramuscular Tomorrow-1000   Continuous Infusions: . sodium chloride 100 mL/hr at 02/14/17 0608     LOS: 1 day    Time spent: 35 minutes.     Alba Cory, MD [facility] Pager [phone]  If 7PM-7AM, please contact night-coverage [email or web address] Password [email or web address] 02/14/2017, 8:31 AM

## 2017-02-15 LAB — CBC
HEMATOCRIT: 34.5 % — AB (ref 36.0–46.0)
HEMOGLOBIN: 11.3 g/dL — AB (ref 12.0–15.0)
MCH: 31.6 pg (ref 26.0–34.0)
MCHC: 32.8 g/dL (ref 30.0–36.0)
MCV: 96.4 fL (ref 78.0–100.0)
PLATELETS: 304 10*3/uL (ref 150–400)
RBC: 3.58 MIL/uL — AB (ref 3.87–5.11)
RDW: 13.8 % (ref 11.5–15.5)
WBC: 19.1 10*3/uL — AB (ref 4.0–10.5)

## 2017-02-15 MED ORDER — FLUTICASONE-SALMETEROL 100-50 MCG/DOSE IN AEPB: 1.0000 | INHALATION_SPRAY | Freq: Two times a day (BID) | RESPIRATORY_TRACT | 4 refills | Status: DC

## 2017-02-15 MED ORDER — LEVOFLOXACIN 750 MG PO TABS: 750.0000 mg | ORAL_TABLET | Freq: Every day | ORAL | 0 refills | Status: DC

## 2017-02-15 MED ORDER — GUAIFENESIN ER 600 MG PO TB12: 600.0000 mg | ORAL_TABLET | Freq: Two times a day (BID) | ORAL | 0 refills | Status: DC

## 2017-02-15 MED ORDER — PREDNISONE 20 MG PO TABS: ORAL_TABLET | ORAL | 0 refills | Status: DC

## 2017-02-15 MED ORDER — ALBUTEROL SULFATE HFA 108 (90 BASE) MCG/ACT IN AERS: 2.0000 | INHALATION_SPRAY | Freq: Four times a day (QID) | RESPIRATORY_TRACT | 2 refills | Status: DC | PRN

## 2017-02-15 NOTE — Plan of Care (Signed)
Problem: Safety: Goal: Ability to remain free from injury will improve Outcome: Progressing Safety precautions maintained  Problem: Pain Managment: Goal: General experience of comfort will improve Outcome: Progressing Denies pain  Problem: Physical Regulation: Goal: Will remain free from infection Outcome: Progressing No signs of infection noted, VS WNL  Problem: Skin Integrity: Goal: Risk for impaired skin integrity will decrease Outcome: Progressing Skin dry and intact  Problem: Tissue Perfusion: Goal: Risk factors for ineffective tissue perfusion will decrease Outcome: Progressing On Lovenox for DVT prophylactic  Problem: Activity: Goal: Risk for activity intolerance will decrease Outcome: Progressing Ambulates to BR independently, tolerates well  Problem: Bowel/Gastric: Goal: Will not experience complications related to bowel motility Outcome: Progressing No gastric or bowel issues reported

## 2017-02-15 NOTE — Progress Notes (Signed)
Patient being discharged home with family.  Discharge instructions given to patient, patient verbalized understanding.  Jakoby Melendrez RN

## 2017-02-15 NOTE — Discharge Summary (Signed)
Physician Discharge Summary  Paula Wiggins ZOX:[id number] DOB: 20-Jan-1991 DOA: 02/12/2017  PCP: Patient, No Pcp Per  Admit date: 02/12/2017 Discharge date: 02/15/2017  Admitted From: Home  Disposition:  Home   Recommendations for Outpatient Follow-up:  1. Follow up with PCP in 1-2 weeks 2. Please obtain BMP/CBC in one week 3. Needs pulmonary function test     Discharge Condition: stable.  CODE STATUS: full code.  Diet recommendation: Heart Healthy   Brief/Interim Summary:  Paula Wiggins a 26 y.o.femalewith medical history significant of chronic persistent asthma who is coming to the emergency department with complaints of progressively worse dyspnea for the past 3 days associated with mostly dry cough which is occasionally productive, pleuritic chest pain, palpitations which has not responded to her home use of albuterol MDI and nebulizer solution.  She mentions that she has had admissions in the past for asthma exacerbation, including to the ICU. She believes that she may have been intubated when she was a child.  ED Course:Initial vital signs in the emergency department were temperature 98.49F, pulse 113, respirations 24, blood pressure 145/101 mmHg and O2 sat 99% on room air. She was given 15 mg total on nebulized albuterol and supplemental oxygen. Solu-Medrol 125 mg IVPB 1 and magnesium sulfate 2 g IVPB.  Her workup shows an ABG with a pH of 7.42, PCO2 of 29.9, PCO2 of 72.5, base excess is 19.1 and O2 sat 93.5% on room air. WBC was 15.8 with 82% neutrophils, hemoglobin 12.2 g/dL and platelets 811. Her BMP showed a potassium level 3.1 mmol/L and a glucose level of 126 mg/dL, all other values were normal. Her phosphorus level was 2.6 and magnesium 1.8 mg/dL. Her chest radiograph did not show any acute cardiopulmonary pathology.  Assessment & Plan:   Principal Problem:   Asthma exacerbation Active Problems:   Acute bronchitis   Hypokalemia   1-Acute asthma  exacerbation;  Patient presents with  tachypnea, poor air movement.  IV solumedrol 60 mg Q 6 hours. subsequently change to prednisone.  Received IV magnesium.  Continue with schedule nebulizer.  Pulmonary consulted. Appreciate Dr Jamison Neighbor help./ plan to discharge on Advai, albuterol. Needs out-patient PFT>  Continue with Levaquin. 3/5. Needs 2 more days   Hypokalemia; resolved.   Constipation; continue with miralax.   Leukocytosis Related to steroids.  trending down.   Discharge Diagnoses:  Principal Problem:   Asthma exacerbation Active Problems:   Acute bronchitis   Hypokalemia    Discharge Instructions  Discharge Instructions    Diet - low sodium heart healthy    Complete by:  As directed    Increase activity slowly    Complete by:  As directed      Allergies as of 02/15/2017      Reactions   Oxycodone Anaphylaxis, Shortness Of Breath, Swelling   Watermelon Flavor Swelling, Other (See Comments)   Swelling of tongue and lips break out      Medication List    STOP taking these medications   ibuprofen 200 MG tablet Commonly known as:  ADVIL,MOTRIN   ibuprofen 600 MG tablet Commonly known as:  ADVIL,MOTRIN   methocarbamol 500 MG tablet Commonly known as:  ROBAXIN   ondansetron 8 MG disintegrating tablet Commonly known as:  ZOFRAN ODT   predniSONE 10 MG (21) Tbpk tablet Commonly known as:  STERAPRED UNI-PAK 21 TAB Replaced by:  predniSONE 20 MG tablet     TAKE these medications   albuterol 108 (90 Base) MCG/ACT inhaler Commonly known as:  PROVENTIL HFA;VENTOLIN HFA Inhale 2 puffs into the lungs every 6 (six) hours as needed for wheezing or shortness of breath.   Fluticasone-Salmeterol 100-50 MCG/DOSE Aepb Commonly known as:  ADVAIR DISKUS Inhale 1 puff into the lungs 2 (two) times daily.   guaiFENesin 600 MG 12 hr tablet Commonly known as:  MUCINEX Take 1 tablet (600 mg total) by mouth 2 (two) times daily.   levofloxacin 750 MG tablet Commonly  known as:  LEVAQUIN Take 1 tablet (750 mg total) by mouth at bedtime.   predniSONE 20 MG tablet Commonly known as:  DELTASONE Take 3  tablets for 5 days Replaces:  predniSONE 10 MG (21) Tbpk tablet       Allergies  Allergen Reactions  . Oxycodone Anaphylaxis, Shortness Of Breath and Swelling  . Watermelon Flavor Swelling and Other (See Comments)    Swelling of tongue and lips break out    Consultations:  pulmonary   Procedures/Studies: Dg Chest 2 View  Result Date: 02/12/2017 CLINICAL DATA:  Shortness of breath EXAM: CHEST  2 VIEW COMPARISON:  Chest radiograph 01/27/2017 FINDINGS: The heart size and mediastinal contours are within normal limits. Both lungs are clear. The visualized skeletal structures are unremarkable. IMPRESSION: No active cardiopulmonary disease. Electronically Signed   By: Deatra Robinson M.D.   On: 02/12/2017 22:09   Dg Chest 2 View  Result Date: 01/27/2017 CLINICAL DATA:  Wheezing, asthma. EXAM: CHEST  2 VIEW COMPARISON:  07/15/2016 FINDINGS: Heart and mediastinal contours are within normal limits. No focal opacities or effusions. No acute bony abnormality. IMPRESSION: No active cardiopulmonary disease. Electronically Signed   By: Charlett Nose M.D.   On: 01/27/2017 19:11       Subjective: Feeling better, almost back to baseline.   Discharge Exam: Vitals:   02/15/17 0549 02/15/17 0855  BP: (!) 131/94   Pulse: 66   Resp: 18   Temp: 98.3 F (36.8 C)   SpO2: 100% 100%   Vitals:   02/14/17 2015 02/14/17 2143 02/15/17 0549 02/15/17 0855  BP:  137/73 (!) 131/94   Pulse:  (!) 101 66   Resp:  (!) 21 18   Temp:  98.1 F (36.7 C) 98.3 F (36.8 C)   TempSrc:  Oral Oral   SpO2: 100% 100% 100% 100%  Weight:      Height:        General: Pt is alert, awake, not in acute distress Cardiovascular: RRR, S1/S2 +, no rubs, no gallops Respiratory: CTA bilaterally, no wheezing, no rhonchi Abdominal: Soft, NT, ND, bowel sounds + Extremities: no edema, no  cyanosis    The results of significant diagnostics from this hospitalization (including imaging, microbiology, ancillary and laboratory) are listed below for reference.     Microbiology: Recent Results (from the past 240 hour(s))  Respiratory Panel by PCR     Status: None   Collection Time: 02/13/17  1:14 PM  Result Value Ref Range Status   Adenovirus NOT DETECTED NOT DETECTED Final   Coronavirus 229E NOT DETECTED NOT DETECTED Final   Coronavirus HKU1 NOT DETECTED NOT DETECTED Final   Coronavirus NL63 NOT DETECTED NOT DETECTED Final   Coronavirus OC43 NOT DETECTED NOT DETECTED Final   Metapneumovirus NOT DETECTED NOT DETECTED Final   Rhinovirus / Enterovirus NOT DETECTED NOT DETECTED Final   Influenza A NOT DETECTED NOT DETECTED Final   Influenza B NOT DETECTED NOT DETECTED Final   Parainfluenza Virus 1 NOT DETECTED NOT DETECTED Final   Parainfluenza Virus 2 NOT  DETECTED NOT DETECTED Final   Parainfluenza Virus 3 NOT DETECTED NOT DETECTED Final   Parainfluenza Virus 4 NOT DETECTED NOT DETECTED Final   Respiratory Syncytial Virus NOT DETECTED NOT DETECTED Final   Bordetella pertussis NOT DETECTED NOT DETECTED Final   Chlamydophila pneumoniae NOT DETECTED NOT DETECTED Final   Mycoplasma pneumoniae NOT DETECTED NOT DETECTED Final    Comment: Performed at [facility], [address] [place], [place] [address]  MRSA PCR Screening     Status: None   Collection Time: 02/13/17  1:14 PM  Result Value Ref Range Status   MRSA by PCR NEGATIVE NEGATIVE Final    Comment:        The GeneXpert MRSA Assay (FDA approved for NASAL specimens only), is one component of a comprehensive MRSA colonization surveillance program. It is not intended to diagnose MRSA infection nor to guide or monitor treatment for MRSA infections.   Culture, expectorated sputum-assessment     Status: None   Collection Time: 02/14/17  2:26 PM  Result Value Ref Range Status   Specimen Description  EXPECTORATED SPUTUM  Final   Special Requests Normal  Final   Sputum evaluation THIS SPECIMEN IS ACCEPTABLE FOR SPUTUM CULTURE  Final   Report Status 02/14/2017 FINAL  Final  Culture, respiratory (NON-Expectorated)     Status: None (Preliminary result)   Collection Time: 02/14/17  2:26 PM  Result Value Ref Range Status   Specimen Description EXPECTORATED SPUTUM  Final   Special Requests Normal Reflexed from [id number]  Final   Gram Stain   Final    MODERATE WBC PRESENT, PREDOMINANTLY PMN ABUNDANT GRAM POSITIVE COCCI IN PAIRS MODERATE GRAM POSITIVE RODS FEW GRAM NEGATIVE RODS FEW GRAM NEGATIVE COCCOBACILLI    Culture   Final    CULTURE REINCUBATED FOR BETTER GROWTH Performed at [facility], [address], [place], [place] [address]    Report Status PENDING  Incomplete     Labs: BNP (last 3 results) No results for input(s): BNP in the last 8760 hours. Basic Metabolic Panel:  Recent Labs Lab 02/13/17 0045 02/13/17 0125 02/14/17 0406  NA 141  --  140  K 3.1*  --  4.3  CL 108  --  110  CO2 23  --  22  GLUCOSE 126*  --  137*  BUN 8  --  10  CREATININE 0.64  --  0.69  CALCIUM 9.0  --  8.9  MG  --  1.8  --   PHOS  --  2.6  --    Liver Function Tests: No results for input(s): AST, ALT, ALKPHOS, BILITOT, PROT, ALBUMIN in the last 168 hours. No results for input(s): LIPASE, AMYLASE in the last 168 hours. No results for input(s): AMMONIA in the last 168 hours. CBC:  Recent Labs Lab 02/13/17 0045 02/14/17 0406 02/15/17 0602  WBC 15.8* 27.2* 19.1*  NEUTROABS 13.1*  --   --   HGB 12.2 11.9* 11.3*  HCT 35.5* 36.4 34.5*  MCV 95.2 97.1 96.4  PLT 271 307 304   Cardiac Enzymes: No results for input(s): CKTOTAL, CKMB, CKMBINDEX, TROPONINI in the last 168 hours. BNP: Invalid input(s): POCBNP CBG: No results for input(s): GLUCAP in the last 168 hours. D-Dimer No results for input(s): DDIMER in the last 72 hours. Hgb A1c No results for input(s): HGBA1C in the last  72 hours. Lipid Profile No results for input(s): CHOL, HDL, LDLCALC, TRIG, CHOLHDL, LDLDIRECT in the last 72 hours. Thyroid function  studies No results for input(s): TSH, T4TOTAL, T3FREE, THYROIDAB in the last 72 hours.  Invalid input(s): FREET3 Anemia work up No results for input(s): VITAMINB12, FOLATE, FERRITIN, TIBC, IRON, RETICCTPCT in the last 72 hours. Urinalysis    Component Value Date/Time   COLORURINE YELLOW 10/16/2016 1201   APPEARANCEUR CLEAR 10/16/2016 1201   LABSPEC 1.010 10/16/2016 1201   PHURINE 7.0 10/16/2016 1201   GLUCOSEU NEGATIVE 10/16/2016 1201   HGBUR NEGATIVE 10/16/2016 1201   BILIRUBINUR NEGATIVE 10/16/2016 1201   KETONESUR NEGATIVE 10/16/2016 1201   PROTEINUR NEGATIVE 10/16/2016 1201   UROBILINOGEN 0.2 10/10/2012 2237   NITRITE NEGATIVE 10/16/2016 1201   LEUKOCYTESUR LARGE (A) 10/16/2016 1201   Sepsis Labs Invalid input(s): PROCALCITONIN,  WBC,  LACTICIDVEN Microbiology Recent Results (from the past 240 hour(s))  Respiratory Panel by PCR     Status: None   Collection Time: 02/13/17  1:14 PM  Result Value Ref Range Status   Adenovirus NOT DETECTED NOT DETECTED Final   Coronavirus 229E NOT DETECTED NOT DETECTED Final   Coronavirus HKU1 NOT DETECTED NOT DETECTED Final   Coronavirus NL63 NOT DETECTED NOT DETECTED Final   Coronavirus OC43 NOT DETECTED NOT DETECTED Final   Metapneumovirus NOT DETECTED NOT DETECTED Final   Rhinovirus / Enterovirus NOT DETECTED NOT DETECTED Final   Influenza A NOT DETECTED NOT DETECTED Final   Influenza B NOT DETECTED NOT DETECTED Final   Parainfluenza Virus 1 NOT DETECTED NOT DETECTED Final   Parainfluenza Virus 2 NOT DETECTED NOT DETECTED Final   Parainfluenza Virus 3 NOT DETECTED NOT DETECTED Final   Parainfluenza Virus 4 NOT DETECTED NOT DETECTED Final   Respiratory Syncytial Virus NOT DETECTED NOT DETECTED Final   Bordetella pertussis NOT DETECTED NOT DETECTED Final   Chlamydophila pneumoniae NOT DETECTED NOT  DETECTED Final   Mycoplasma pneumoniae NOT DETECTED NOT DETECTED Final    Comment: Performed at [facility], [address], [place], [place] [address]  MRSA PCR Screening     Status: None   Collection Time: 02/13/17  1:14 PM  Result Value Ref Range Status   MRSA by PCR NEGATIVE NEGATIVE Final    Comment:        The GeneXpert MRSA Assay (FDA approved for NASAL specimens only), is one component of a comprehensive MRSA colonization surveillance program. It is not intended to diagnose MRSA infection nor to guide or monitor treatment for MRSA infections.   Culture, expectorated sputum-assessment     Status: None   Collection Time: 02/14/17  2:26 PM  Result Value Ref Range Status   Specimen Description EXPECTORATED SPUTUM  Final   Special Requests Normal  Final   Sputum evaluation THIS SPECIMEN IS ACCEPTABLE FOR SPUTUM CULTURE  Final   Report Status 02/14/2017 FINAL  Final  Culture, respiratory (NON-Expectorated)     Status: None (Preliminary result)   Collection Time: 02/14/17  2:26 PM  Result Value Ref Range Status   Specimen Description EXPECTORATED SPUTUM  Final   Special Requests Normal Reflexed from [id number]  Final   Gram Stain   Final    MODERATE WBC PRESENT, PREDOMINANTLY PMN ABUNDANT GRAM POSITIVE COCCI IN PAIRS MODERATE GRAM POSITIVE RODS FEW GRAM NEGATIVE RODS FEW GRAM NEGATIVE COCCOBACILLI    Culture   Final    CULTURE REINCUBATED FOR BETTER GROWTH Performed at [facility], [address], [place], [place] [address]    Report Status PENDING  Incomplete     Time coordinating discharge: Over 30 minutes  SIGNED:   Alba Cory, MD  [facility] 02/15/2017, 11:32 AM Pager   If 7PM-7AM, please contact night-coverage [email or web address] Password [email or web address]

## 2017-02-15 NOTE — Progress Notes (Signed)
Patient ambulated in hallway, patient tolerated well.  O2 saturation at 100% on room air prior to ambulation and 96-97% during ambulation.  Will continue to monitor.  Tyrisha Benninger RN

## 2017-02-15 NOTE — Progress Notes (Signed)
Spoke with patient at bedside. States she lives in an apartment that has mold and feels this is why she had the asthma exacerbation. She states she has reported this to her landlord but they have not repaired anything. She states she is active with [facility], when pressed why she has so many ED visits she states it has been a while since she has been there. Encouraged her to f/u with them since hospitalized and get reestablished. They also have people who can possibly continue to help her with the mold issues. Contacted [facility] to make appt and they were away at lunch, also contacted Jessica CM with [facility] to assist with efforts, left her a VM. Encourage patient to call today to make f/u appt. Contacted [facility], awaiting call back.

## 2017-02-17 LAB — CULTURE, RESPIRATORY W GRAM STAIN
Culture: NORMAL
Special Requests: NORMAL

## 2017-02-17 LAB — CULTURE, RESPIRATORY

## 2017-03-02 ENCOUNTER — Encounter (HOSPITAL_COMMUNITY): Payer: Self-pay | Admitting: Emergency Medicine

## 2017-03-02 ENCOUNTER — Ambulatory Visit (HOSPITAL_COMMUNITY)
Admission: EM | Admit: 2017-03-02 | Discharge: 2017-03-02 | Disposition: A | Discharge status: Home / Self Care | Payer: PRIVATE HEALTH INSURANCE | Attending: Family Medicine | Admitting: Family Medicine

## 2017-03-02 DIAGNOSIS — J4541 Moderate persistent asthma with (acute) exacerbation: Secondary | ICD-10-CM | POA: Diagnosis not present

## 2017-03-02 DIAGNOSIS — J45901 Unspecified asthma with (acute) exacerbation: Secondary | ICD-10-CM

## 2017-03-02 MED ORDER — ALBUTEROL SULFATE (2.5 MG/3ML) 0.083% IN NEBU
2.5000 mg | INHALATION_SOLUTION | Freq: Once | RESPIRATORY_TRACT | Status: AC
  Administered 2017-03-02: 2.5 mg via RESPIRATORY_TRACT

## 2017-03-02 MED ORDER — ALBUTEROL SULFATE HFA 108 (90 BASE) MCG/ACT IN AERS: 2.0000 | INHALATION_SPRAY | Freq: Four times a day (QID) | RESPIRATORY_TRACT | 2 refills | Status: DC | PRN

## 2017-03-02 MED ORDER — PREDNISONE 10 MG (21) PO TBPK: ORAL_TABLET | Freq: Every day | ORAL | 0 refills | Status: DC

## 2017-03-02 MED ORDER — ALBUTEROL SULFATE (2.5 MG/3ML) 0.083% IN NEBU
INHALATION_SOLUTION | RESPIRATORY_TRACT | Status: AC
  Filled 2017-03-02: qty 3

## 2017-03-02 NOTE — ED Triage Notes (Signed)
Pt here for asthma sx and sts out of her inhaler; pt was hospitalized recently for asthma

## 2017-03-03 ENCOUNTER — Encounter (HOSPITAL_COMMUNITY): Payer: Self-pay | Admitting: Emergency Medicine

## 2017-03-03 ENCOUNTER — Emergency Department (HOSPITAL_COMMUNITY): Payer: PRIVATE HEALTH INSURANCE

## 2017-03-03 ENCOUNTER — Emergency Department (HOSPITAL_COMMUNITY)
Admission: EM | Admit: 2017-03-03 | Discharge: 2017-03-03 | Disposition: A | Discharge status: Home / Self Care | Payer: PRIVATE HEALTH INSURANCE | Attending: Emergency Medicine | Admitting: Emergency Medicine

## 2017-03-03 DIAGNOSIS — J45901 Unspecified asthma with (acute) exacerbation: Secondary | ICD-10-CM | POA: Insufficient documentation

## 2017-03-03 DIAGNOSIS — Z87891 Personal history of nicotine dependence: Secondary | ICD-10-CM | POA: Insufficient documentation

## 2017-03-03 MED ORDER — DEXAMETHASONE SODIUM PHOSPHATE 10 MG/ML IJ SOLN
10.0000 mg | Freq: Once | INTRAMUSCULAR | Status: AC
  Administered 2017-03-03: 10 mg via INTRAMUSCULAR
  Filled 2017-03-03: qty 1

## 2017-03-03 MED ORDER — ALBUTEROL SULFATE (5 MG/ML) 0.5% IN NEBU: 2.5000 mg | INHALATION_SOLUTION | Freq: Four times a day (QID) | RESPIRATORY_TRACT | 0 refills | Status: DC | PRN

## 2017-03-03 MED ORDER — ALBUTEROL SULFATE HFA 108 (90 BASE) MCG/ACT IN AERS
2.0000 | INHALATION_SPRAY | RESPIRATORY_TRACT | Status: DC | PRN
  Administered 2017-03-03: 2 via RESPIRATORY_TRACT
  Filled 2017-03-03: qty 6.7

## 2017-03-03 MED ORDER — ALBUTEROL SULFATE (2.5 MG/3ML) 0.083% IN NEBU
5.0000 mg | INHALATION_SOLUTION | Freq: Once | RESPIRATORY_TRACT | Status: AC
  Administered 2017-03-03: 5 mg via RESPIRATORY_TRACT
  Filled 2017-03-03: qty 6

## 2017-03-03 NOTE — ED Provider Notes (Signed)
[facility]-EMERGENCY DEPT Provider Note   CSN: [id number] Arrival date & time: 03/03/17  1953     History   Chief Complaint Chief Complaint  Patient presents with  . Asthma    HPI Paula Wiggins is a 26 y.o. female.  Patient presents to the emergency department with chief complaint of asthma exacerbation. She states she was seen recently for the same. She states that when she was released yesterday, she was unable to get her prescriptions filled due to the cost.  She reports some wheezing which gradually worsened throughout the day today. She denies any fever, chills, or productive cough. She was given a breathing treatment in triage, and has had complete resolution of her wheezing. She states that she now feels well. She would like to see if she can get nebulizer treatments refilled, because she can afford these.   The history is provided by the patient. No language interpreter was used.    Past Medical History:  Diagnosis Date  . Asthma     Patient Active Problem List   Diagnosis Date Noted  . Asthma exacerbation 02/13/2017  . Acute bronchitis 02/13/2017  . Hypokalemia 02/13/2017    History reviewed. No pertinent surgical history.  OB History    No data available       Home Medications    Prior to Admission medications   Medication Sig Start Date End Date Taking? Authorizing Provider  albuterol (PROVENTIL HFA;VENTOLIN HFA) 108 (90 Base) MCG/ACT inhaler Inhale 2 puffs into the lungs every 6 (six) hours as needed for wheezing or shortness of breath. 03/02/17   Mardella Layman, MD  Fluticasone-Salmeterol (ADVAIR DISKUS) 100-50 MCG/DOSE AEPB Inhale 1 puff into the lungs 2 (two) times daily. 02/15/17 02/15/18  Regalado, Belkys A, MD  guaiFENesin (MUCINEX) 600 MG 12 hr tablet Take 1 tablet (600 mg total) by mouth 2 (two) times daily. 02/15/17   Regalado, Belkys A, MD  levofloxacin (LEVAQUIN) 750 MG tablet Take 1 tablet (750 mg total) by mouth at  bedtime. 02/15/17   Regalado, Belkys A, MD  predniSONE (STERAPRED UNI-PAK 21 TAB) 10 MG (21) TBPK tablet Take by mouth daily. Take as directed. 03/02/17   Mardella Layman, MD    Family History Family History  Problem Relation Age of Onset  . Hypertension Mother   . Asthma Mother   . Seizures Father   . Hypertension Father   . Congestive Heart Failure Paternal Grandfather   . Asthma Sister   . Emphysema Maternal Grandmother   . Asthma Sister   . Multiple sclerosis Paternal Aunt     Social History Social History  Substance Use Topics  . Smoking status: Former Smoker    Types: Cigarettes  . Smokeless tobacco: Never Used     Comment: Smoked for 1 month total.   . Alcohol use No     Allergies   Oxycodone and Watermelon flavor   Review of Systems Review of Systems  All other systems reviewed and are negative.    Physical Exam Updated Vital Signs BP (!) 146/96 (BP Location: Left Arm)   Pulse (!) 117   Temp 98.3 F (36.8 C) (Oral)   Resp 20   LMP 02/08/2017 (Exact Date)   SpO2 100%   Physical Exam  Constitutional: She is oriented to person, place, and time. She appears well-developed and well-nourished.  HENT:  Head: Normocephalic and atraumatic.  Eyes: Pupils are equal, round, and reactive to light. Conjunctivae and EOM are normal.  Neck: Normal  range of motion. Neck supple.  Cardiovascular: Normal rate and regular rhythm.  Exam reveals no gallop and no friction rub.   No murmur heard. Pulmonary/Chest: Effort normal and breath sounds normal. No respiratory distress. She has no wheezes. She has no rales. She exhibits no tenderness.  CTAB  Abdominal: Soft. Bowel sounds are normal. She exhibits no distension and no mass. There is no tenderness. There is no rebound and no guarding.  Musculoskeletal: Normal range of motion. She exhibits no edema or tenderness.  Neurological: She is alert and oriented to person, place, and time.  Skin: Skin is warm and dry.    Psychiatric: She has a normal mood and affect. Her behavior is normal. Judgment and thought content normal.  Nursing note and vitals reviewed.    ED Treatments / Results  Labs (all labs ordered are listed, but only abnormal results are displayed) Labs Reviewed - No data to display  EKG  EKG Interpretation None       Radiology Dg Chest 2 View  Result Date: 03/03/2017 CLINICAL DATA:  Shortness of Breath EXAM: CHEST  2 VIEW COMPARISON:  02/12/2017 FINDINGS: Cardiac shadow is within normal limits. The lungs are well aerated bilaterally. No acute bony abnormality is noted. IMPRESSION: No active cardiopulmonary disease. Electronically Signed   By: Alcide CleverMark  Lukens M.D.   On: 03/03/2017 21:14    Procedures Procedures (including critical care time)  Medications Ordered in ED Medications  dexamethasone (DECADRON) injection 10 mg (not administered)  albuterol (PROVENTIL HFA;VENTOLIN HFA) 108 (90 Base) MCG/ACT inhaler 2 puff (not administered)  albuterol (PROVENTIL) (2.5 MG/3ML) 0.083% nebulizer solution 5 mg (5 mg Nebulization Given 03/03/17 2046)     Initial Impression / Assessment and Plan / ED Course  I have reviewed the triage vital signs and the nursing notes.  Pertinent labs & imaging results that were available during my care of the patient were reviewed by me and considered in my medical decision making (see chart for details).     Lung sounds clear to auscultation. Patient has, tachycardic on my exam, suspect secondary to albuterol. Patient is in no acute distress. She states that she feels well now. Feels ready for discharge. I will refill her nebulizer treatment and give her an inhaler in the ED as well as an IM injection of Decadron. Patient understands agrees to plan. Return precautions given.  Final Clinical Impressions(s) / ED Diagnoses   Final diagnoses:  Exacerbation of asthma, unspecified asthma severity, unspecified whether persistent    New Prescriptions New  Prescriptions   ALBUTEROL (PROVENTIL) (5 MG/ML) 0.5% NEBULIZER SOLUTION    Take 0.5 mLs (2.5 mg total) by nebulization every 6 (six) hours as needed for wheezing or shortness of breath.     Roxy HorsemanBrowning, Kameren Pargas, PA-C 03/03/17 2329    Paula LibraMolpus, John, MD 03/04/17 [phone]

## 2017-03-03 NOTE — ED Triage Notes (Signed)
Pt has asthma and states it started to flare back up yesterday  Pt was here a couple weeks ago and was admitted into the hospital for 4 days  Pt states she was not able to get her inhalers filled because it was over $400

## 2017-03-03 NOTE — ED Provider Notes (Signed)
[facility]   [id number] 03/02/17 Arrival Time: 1900  ASSESSMENT & PLAN:  1. Moderate asthma with exacerbation, unspecified whether persistent     Meds ordered this encounter  Medications  . albuterol (PROVENTIL) (2.5 MG/3ML) 0.083% nebulizer solution 2.5 mg  . predniSONE (STERAPRED UNI-PAK 21 TAB) 10 MG (21) TBPK tablet    Sig: Take by mouth daily. Take as directed.    Dispense:  21 tablet    Refill:  0  . albuterol (PROVENTIL HFA;VENTOLIN HFA) 108 (90 Base) MCG/ACT inhaler    Sig: Inhale 2 puffs into the lungs every 6 (six) hours as needed for wheezing or shortness of breath.    Dispense:  1 Inhaler    Refill:  2   Mild symptom improvement with neb tonight. Will go to pharmacy now and fill Rx for prednisone. ED if not showing significant improvement over the next 24 hours. Reviewed expectations re: course of current medical issues. Questions answered. Outlined signs and symptoms indicating need for more acute intervention. Patient verbalized understanding. After Visit Summary given.   SUBJECTIVE:  Paula Wiggins is a 26 y.o. female who presents with complaint of asthma exacerbation. Recently admitted for same. Current symptoms with gradual onset over 1 day. Afebrile. Out of albuterol inhaler. Symptoms/wheezing worse with exertion. Better with rest. No recent illness. No OTC treatment. No n/v. Ambulatory. Normal PO intake.  ROS: As per HPI.   OBJECTIVE:  Vitals:   03/02/17 1924  BP: 132/80  Pulse: (!) 110  Resp: 20  Temp: 98.8 F (37.1 C)  TempSrc: Oral  SpO2: 100%    General appearance: alert; no distress Eyes: PERRLA; EOMI; conjunctiva normal HENT: normocephalic; atraumatic; TMs normal; nasal mucosa normal; oral mucosa normal Neck: supple Lungs: mildly labored respirations; wheezing bilaterally Heart: regular rate and rhythm Abdomen: soft, non-tender; bowel sounds normal; no masses or organomegaly; no guarding or rebound tenderness Back: no CVA  tenderness Extremities: no cyanosis or edema; symmetrical with no gross deformities Skin: warm and dry Neurologic: normal gait; normal symmetric reflexes Psychological: alert and cooperative; normal mood and affect   Allergies  Allergen Reactions  . Oxycodone Anaphylaxis, Shortness Of Breath and Swelling  . Watermelon Flavor Swelling and Other (See Comments)    Swelling of tongue and lips break out    Past Medical History:  Diagnosis Date  . Asthma    Social History   Social History  . Marital status: Single    Spouse name: N/A  . Number of children: N/A  . Years of education: N/A   Occupational History  . Not on file.   Social History Main Topics  . Smoking status: Former Smoker    Types: Cigarettes  . Smokeless tobacco: Never Used     Comment: Smoked for 1 month total.   . Alcohol use No  . Drug use: No  . Sexual activity: Not Currently     Comment: "with a woman"   Other Topics Concern  . Not on file   Social History Narrative   [facility] (02/13/17):   Patient denies any pets currently. Denies any bird exposure. No recent travel. No indoor plants. Does have carpet in her apartment. She reports there is mold around the vents in her bedroom. She has been sleeping in the living room with this mold visible.   Family History  Problem Relation Age of Onset  . Hypertension Mother   . Asthma Mother   . Seizures Father   . Hypertension Father   .  Congestive Heart Failure Paternal Grandfather   . Asthma Sister   . Emphysema Maternal Grandmother   . Asthma Sister   . Multiple sclerosis Paternal Renato Gails, MD 03/03/17 [phone]

## 2017-03-10 ENCOUNTER — Ambulatory Visit: Payer: PRIVATE HEALTH INSURANCE | Attending: Critical Care Medicine | Admitting: Critical Care Medicine

## 2017-03-10 ENCOUNTER — Encounter: Payer: Self-pay | Admitting: Critical Care Medicine

## 2017-03-10 VITALS — BP 111/76 | HR 96 | Temp 98.3°F | Resp 20 | Ht 64.0 in | Wt 247.4 lb

## 2017-03-10 DIAGNOSIS — J019 Acute sinusitis, unspecified: Secondary | ICD-10-CM | POA: Insufficient documentation

## 2017-03-10 DIAGNOSIS — Z79899 Other long term (current) drug therapy: Secondary | ICD-10-CM | POA: Insufficient documentation

## 2017-03-10 DIAGNOSIS — J4551 Severe persistent asthma with (acute) exacerbation: Secondary | ICD-10-CM | POA: Insufficient documentation

## 2017-03-10 DIAGNOSIS — Z77120 Contact with and (suspected) exposure to mold (toxic): Secondary | ICD-10-CM | POA: Diagnosis not present

## 2017-03-10 DIAGNOSIS — K219 Gastro-esophageal reflux disease without esophagitis: Secondary | ICD-10-CM | POA: Diagnosis not present

## 2017-03-10 DIAGNOSIS — Z87891 Personal history of nicotine dependence: Secondary | ICD-10-CM | POA: Diagnosis not present

## 2017-03-10 DIAGNOSIS — J0141 Acute recurrent pansinusitis: Secondary | ICD-10-CM | POA: Diagnosis not present

## 2017-03-10 MED ORDER — OMEPRAZOLE 20 MG PO CPDR: 20.0000 mg | DELAYED_RELEASE_CAPSULE | Freq: Every day | ORAL | 4 refills | Status: DC

## 2017-03-10 MED ORDER — BUDESONIDE-FORMOTEROL FUMARATE 160-4.5 MCG/ACT IN AERO: 2.0000 | INHALATION_SPRAY | Freq: Two times a day (BID) | RESPIRATORY_TRACT | 12 refills | Status: DC

## 2017-03-10 MED ORDER — PREDNISONE 10 MG PO TABS: ORAL_TABLET | ORAL | 0 refills | Status: DC

## 2017-03-10 MED ORDER — AZITHROMYCIN 250 MG PO TABS: ORAL_TABLET | ORAL | 0 refills | Status: DC

## 2017-03-10 MED ORDER — ALBUTEROL SULFATE HFA 108 (90 BASE) MCG/ACT IN AERS: 2.0000 | INHALATION_SPRAY | Freq: Four times a day (QID) | RESPIRATORY_TRACT | 2 refills | Status: DC | PRN

## 2017-03-10 MED ORDER — FLUTICASONE PROPIONATE 50 MCG/ACT NA SUSP: 2.0000 | Freq: Every day | NASAL | 6 refills | Status: DC

## 2017-03-10 NOTE — Assessment & Plan Note (Signed)
Severe persistent asthma with Mold exposure in the home and GERD  Chronic and acute sinusitis Plan Start symbicort two puff bid 160 Start fluticasone nasal spray two puff ea nostril daily Azithromycin 250mg  Take two once then one daily until gone Mold exposure mitagation:  Ref to social services

## 2017-03-10 NOTE — Assessment & Plan Note (Signed)
GERD ppt factor Rec: GERD diet and omeprazole daily

## 2017-03-10 NOTE — Patient Instructions (Signed)
Start prednisone again 10mg  Take 4 for three days 3 for three days 2 for three days 1 for three days and stop Take azithromycin antibiotic for 5 days Start Symbicort two puff twice daily Refill on albuterol inhaler given Start omeprazole 20mg  one before breakfast, daily Start fluticasone two puff each nostril daily Return on November 7th   Food Choices for Gastroesophageal Reflux Disease, Adult When you have gastroesophageal reflux disease (GERD), the foods you eat and your eating habits are very important. Choosing the right foods can help ease your discomfort. What guidelines do I need to follow?  Choose fruits, vegetables, whole grains, and low-fat dairy products.  Choose low-fat meat, fish, and poultry.  Limit fats such as oils, salad dressings, butter, nuts, and avocado.  Keep a food diary. This helps you identify foods that cause symptoms.  Avoid foods that cause symptoms. These may be different for everyone.  Eat small meals often instead of 3 large meals a day.  Eat your meals slowly, in a place where you are relaxed.  Limit fried foods.  Cook foods using methods other than frying.  Avoid drinking alcohol.  Avoid drinking large amounts of liquids with your meals.  Avoid bending over or lying down until 2-3 hours after eating. What foods are not recommended? These are some foods and drinks that may make your symptoms worse: Vegetables Tomatoes. Tomato juice. Tomato and spaghetti sauce. Chili peppers. Onion and garlic. Horseradish. Fruits Oranges, grapefruit, and lemon (fruit and juice). Meats High-fat meats, fish, and poultry. This includes hot dogs, ribs, ham, sausage, salami, and bacon. Dairy Whole milk and chocolate milk. Sour cream. Cream. Butter. Ice cream. Cream cheese. Drinks Coffee and tea. Bubbly (carbonated) drinks or energy drinks. Condiments Hot sauce. Barbecue sauce. Sweets/Desserts Chocolate and cocoa. Donuts. Peppermint and spearmint. Fats and  Oils High-fat foods. This includes Jamaica[place] fries and potato chips. Other Vinegar. Strong spices. This includes black pepper, white pepper, red pepper, cayenne, curry powder, cloves, ginger, and chili powder. The items listed above may not be a complete list of foods and drinks to avoid. Contact your dietitian for more information. This information is not intended to replace advice given to you by your health care provider. Make sure you discuss any questions you have with your health care provider. Document Released: 11/03/2011 Document Revised: 10/10/2015 Document Reviewed: 03/08/2013 Elsevier Interactive Patient Education  2017 [employer]

## 2017-03-10 NOTE — Progress Notes (Signed)
Subjective:    Patient ID: Paula Wiggins, female    DOB: 13-May-1991, 26 y.o.   MRN: [id number]  26 y.o.F hx of Asthma. Frequent ED visits 6 in 6 mos.  Not able to afford ABX or Advair Rx.  Has had mold in home for two years.  Pt had an inspection of the home and mold issues.  [address] [employer] Pt has been tested and is pos for mold allergies.  Pt saw pulmonary with recent admit to [facility] Pt Rx steroids and nebs.   Not able to sleep.     Asthma  She complains of chest tightness, cough, difficulty breathing, frequent throat clearing, hoarse voice, shortness of breath, sputum production and wheezing. There is no hemoptysis. This is a chronic problem. The current episode started more than 1 year ago. The problem occurs constantly. The problem has been gradually worsening. The cough is productive of purulent sputum, productive, nocturnal, supine, paroxysmal and hacking. Associated symptoms include chest pain, dyspnea on exertion, ear congestion, ear pain, headaches, heartburn, malaise/fatigue, myalgias, nasal congestion, orthopnea, PND, postnasal drip, rhinorrhea, sneezing, a sore throat, sweats and trouble swallowing. Pertinent negatives include no appetite change. Associated symptoms comments: Nasal drainage is green and pink to yellow with blood Mucus is green . Her symptoms are aggravated by any activity, change in weather, eating, emotional stress, exercise, lying down, exposure to fumes, exposure to smoke, pollen, strenuous activity, URI and minimal activity. Her symptoms are alleviated by beta-agonist and oral steroids. Her past medical history is significant for asthma, bronchitis and pneumonia.   Past Medical History:  Diagnosis Date  . Asthma      Family History  Problem Relation Age of Onset  . Hypertension Mother   . Asthma Mother   . Seizures Father   . Hypertension Father   . Congestive Heart Failure Paternal Grandfather   . Asthma Sister   . Emphysema Maternal  Grandmother   . Asthma Sister   . Multiple sclerosis Paternal Aunt      Social History   Social History  . Marital status: Single    Spouse name: N/A  . Number of children: N/A  . Years of education: N/A   Occupational History  . Not on file.   Social History Main Topics  . Smoking status: Former Smoker    Types: Cigarettes  . Smokeless tobacco: Never Used     Comment: Smoked for 1 month total.   . Alcohol use No  . Drug use: No  . Sexual activity: Not Currently     Comment: "with a woman"   Other Topics Concern  . Not on file   Social History Narrative   [facility] (02/13/17):   Patient denies any pets currently. Denies any bird exposure. No recent travel. No indoor plants. Does have carpet in her apartment. She reports there is mold around the vents in her bedroom. She has been sleeping in the living room with this mold visible.     Allergies  Allergen Reactions  . Oxycodone Anaphylaxis, Shortness Of Breath and Swelling  . Watermelon Flavor Swelling and Other (See Comments)    Swelling of tongue and lips break out     Outpatient Medications Prior to Visit  Medication Sig Dispense Refill  . albuterol (PROVENTIL) (5 MG/ML) 0.5% nebulizer solution Take 0.5 mLs (2.5 mg total) by nebulization every 6 (six) hours as needed for wheezing or shortness of breath. 20 mL 0  . albuterol (PROVENTIL HFA;VENTOLIN HFA) 108 (  90 Base) MCG/ACT inhaler Inhale 2 puffs into the lungs every 6 (six) hours as needed for wheezing or shortness of breath. 1 Inhaler 2  . predniSONE (STERAPRED UNI-PAK 21 TAB) 10 MG (21) TBPK tablet Take by mouth daily. Take as directed. 21 tablet 0  . Fluticasone-Salmeterol (ADVAIR DISKUS) 100-50 MCG/DOSE AEPB Inhale 1 puff into the lungs 2 (two) times daily. (Patient not taking: Reported on 03/10/2017) 1 each 4  . guaiFENesin (MUCINEX) 600 MG 12 hr tablet Take 1 tablet (600 mg total) by mouth 2 (two) times daily. (Patient not taking: Reported on 03/10/2017)  10 tablet 0  . levofloxacin (LEVAQUIN) 750 MG tablet Take 1 tablet (750 mg total) by mouth at bedtime. (Patient not taking: Reported on 03/10/2017) 2 tablet 0   No facility-administered medications prior to visit.       Review of Systems  Constitutional: Positive for malaise/fatigue. Negative for appetite change.  HENT: Positive for ear pain, hoarse voice, postnasal drip, rhinorrhea, sneezing, sore throat and trouble swallowing.   Respiratory: Positive for cough, sputum production, shortness of breath and wheezing. Negative for hemoptysis.   Cardiovascular: Positive for chest pain, dyspnea on exertion and PND.  Gastrointestinal: Positive for heartburn.  Musculoskeletal: Positive for myalgias.  Neurological: Positive for headaches.       Objective:   Physical Exam Vitals:   03/10/17 0911  BP: 111/76  Pulse: 96  Resp: 20  Temp: 98.3 F (36.8 C)  TempSrc: Oral  SpO2: 99%  Weight: 247 lb 6.4 oz (112.2 kg)  Height: 5\' 4"  (1.626 m)    Gen: Pleasant, obese, in no distress,  normal affect  ENT: No lesions,  mouth clear,  oropharynx clear, ++ postnasal drip, severe turbinate edema, purulence   Neck: No JVD, no TMG, no carotid bruits  Lungs: No use of accessory muscles, no dullness to percussion, expired wheezes, poor airflow  Cardiovascular: RRR, heart sounds normal, no murmur or gallops, no peripheral edema  Abdomen: soft and NT, no HSM,  BS normal  Musculoskeletal: No deformities, no cyanosis or clubbing  Neuro: alert, non focal  Skin: Warm, no lesions or rashes  No results found. CXR 01/2017: NAD All other labs ok resp culture 01/2017: normal flora       Assessment & Plan:  I personally reviewed all images and [facility] as well as any outside material available during this office visit and agree with the  radiology impressions.   Severe persistent asthma with exacerbation Severe persistent asthma with Mold exposure in the home and GERD    Chronic and acute sinusitis Plan Start symbicort two puff bid 160 Start fluticasone nasal spray two puff ea nostril daily Azithromycin 250mg  Take two once then one daily until gone Mold exposure mitagation:  Ref to social services  Sinusitis, acute See acute asthma assessment  GERD (gastroesophageal reflux disease) GERD ppt factor Rec: GERD diet and omeprazole daily   Diagnoses and all orders for this visit:  Severe persistent asthma with exacerbation  Gastroesophageal reflux disease without esophagitis  Acute recurrent pansinusitis  Mold exposure  Other orders -     Discontinue: albuterol (PROVENTIL HFA;VENTOLIN HFA) 108 (90 Base) MCG/ACT inhaler; Inhale 2 puffs into the lungs every 6 (six) hours as needed for wheezing or shortness of breath. -     predniSONE (DELTASONE) 10 MG tablet; Take 4 for three days 3 for three days 2 for three days 1 for three days and stop -     budesonide-formoterol (  SYMBICORT) 160-4.5 MCG/ACT inhaler; Inhale 2 puffs into the lungs 2 (two) times daily. -     omeprazole (PRILOSEC) 20 MG capsule; Take 1 capsule (20 mg total) by mouth daily before breakfast. -     fluticasone (FLONASE) 50 MCG/ACT nasal spray; Place 2 sprays into both nostrils daily. -     azithromycin (ZITHROMAX) 250 MG tablet; Take two once then one daily until gone -     albuterol (PROVENTIL HFA;VENTOLIN HFA) 108 (90 Base) MCG/ACT inhaler; Inhale 2 puffs into the lungs every 6 (six) hours as needed for wheezing or shortness of breath.

## 2017-03-10 NOTE — Progress Notes (Signed)
Pt has asthma. She states she has more flare up because of the mold in home

## 2017-03-10 NOTE — Assessment & Plan Note (Signed)
See acute asthma assessment

## 2017-03-24 ENCOUNTER — Ambulatory Visit: Payer: PRIVATE HEALTH INSURANCE | Attending: Critical Care Medicine | Admitting: Critical Care Medicine

## 2017-03-24 ENCOUNTER — Encounter: Payer: Self-pay | Admitting: Critical Care Medicine

## 2017-03-24 VITALS — BP 120/85 | HR 87 | Temp 99.0°F | Resp 16 | Wt 250.2 lb

## 2017-03-24 DIAGNOSIS — Z8701 Personal history of pneumonia (recurrent): Secondary | ICD-10-CM | POA: Insufficient documentation

## 2017-03-24 DIAGNOSIS — Z79899 Other long term (current) drug therapy: Secondary | ICD-10-CM | POA: Insufficient documentation

## 2017-03-24 DIAGNOSIS — Z7951 Long term (current) use of inhaled steroids: Secondary | ICD-10-CM | POA: Diagnosis not present

## 2017-03-24 DIAGNOSIS — Z825 Family history of asthma and other chronic lower respiratory diseases: Secondary | ICD-10-CM | POA: Insufficient documentation

## 2017-03-24 DIAGNOSIS — Z885 Allergy status to narcotic agent status: Secondary | ICD-10-CM | POA: Insufficient documentation

## 2017-03-24 DIAGNOSIS — Z87891 Personal history of nicotine dependence: Secondary | ICD-10-CM | POA: Insufficient documentation

## 2017-03-24 DIAGNOSIS — Z8249 Family history of ischemic heart disease and other diseases of the circulatory system: Secondary | ICD-10-CM | POA: Diagnosis not present

## 2017-03-24 DIAGNOSIS — J45909 Unspecified asthma, uncomplicated: Secondary | ICD-10-CM | POA: Diagnosis present

## 2017-03-24 DIAGNOSIS — J4550 Severe persistent asthma, uncomplicated: Secondary | ICD-10-CM

## 2017-03-24 DIAGNOSIS — Z77120 Contact with and (suspected) exposure to mold (toxic): Secondary | ICD-10-CM

## 2017-03-24 NOTE — Patient Instructions (Signed)
Stay on inhalers and omeprazole Return 3 months Social service will contact you

## 2017-03-24 NOTE — Assessment & Plan Note (Signed)
Connect pt with social services ? Any way to get landlord to change air filters and help with mold issue in apts?

## 2017-03-24 NOTE — Assessment & Plan Note (Signed)
Severe persistent asthma with mold exposure and sinusitis  Improved Plan Continue symbicort, prn saba, no further oral steroids, no abx Continue fluticasone nasal Return 3 months

## 2017-03-24 NOTE — Progress Notes (Signed)
Subjective:    Patient ID: Paula PickettSavonya Wiggins, female    DOB: [birth year], [age] y.o.   MRN: [id number]  26 y.o.F hx of Asthma. Frequent ED visits 6 in 6 mos.  Not able to afford ABX or Advair Rx.  Has had mold in home for two years.  Pt had an inspection of the home and mold issues.  [address]    Here for f/u asthma Pt rx with symbicort/flonase ABX at last OV    Asthma  She complains of frequent throat clearing, hoarse voice, shortness of breath and wheezing. There is no chest tightness, cough, difficulty breathing, hemoptysis or sputum production. Primary symptoms comments: Only dyspneic up stairs occ wheezing, is better. This is a chronic problem. The current episode started more than 1 year ago. The problem occurs constantly. The problem has been rapidly improving. The cough is productive of purulent sputum, productive, nocturnal, supine, paroxysmal and hacking. Associated symptoms include chest pain, dyspnea on exertion, headaches, nasal congestion, orthopnea, PND, postnasal drip, rhinorrhea, sweats and trouble swallowing. Pertinent negatives include no appetite change, ear congestion, ear pain, heartburn, malaise/fatigue, myalgias, sneezing or sore throat. Associated symptoms comments: Nasal drainage is green and pink to yellow with blood Mucus is green . Her symptoms are aggravated by any activity, change in weather, eating, emotional stress, exercise, lying down, exposure to fumes, exposure to smoke, pollen, strenuous activity, URI and minimal activity. Her symptoms are alleviated by beta-agonist and oral steroids. Her past medical history is significant for asthma, bronchitis and pneumonia.   Past Medical History:  Diagnosis Date  . Asthma      Family History  Problem Relation Age of Onset  . Hypertension Mother   . Asthma Mother   . Seizures Father   . Hypertension Father   . Congestive Heart Failure Paternal Grandfather   . Asthma Sister   . Emphysema  Maternal Grandmother   . Asthma Sister   . Multiple sclerosis Paternal Aunt      Social History   Socioeconomic History  . Marital status: Single    Spouse name: Not on file  . Number of children: Not on file  . Years of education: Not on file  . Highest education level: Not on file  Social Needs  . Financial resource strain: Not on file  . Food insecurity - worry: Not on file  . Food insecurity - inability: Not on file  . Transportation needs - medical: Not on file  . Transportation needs - non-medical: Not on file  Occupational History  . Not on file  Tobacco Use  . Smoking status: Former Smoker    Types: Cigarettes  . Smokeless tobacco: Never Used  . Tobacco comment: Smoked for 1 month total.   Substance and Sexual Activity  . Alcohol use: No  . Drug use: No  . Sexual activity: Not Currently    Comment: "with a woman"  Other Topics Concern  . Not on file  Social History Narrative   [facility] (02/13/17):   Patient denies any pets currently. Denies any bird exposure. No recent travel. No indoor plants. Does have carpet in her apartment. She reports there is mold around the vents in her bedroom. She has been sleeping in the living room with this mold visible.     Allergies  Allergen Reactions  . Oxycodone Anaphylaxis, Shortness Of Breath and Swelling  . Watermelon Flavor Swelling and Other (See Comments)    Swelling of tongue and lips break  out     Outpatient Medications Prior to Visit  Medication Sig Dispense Refill  . albuterol (PROVENTIL HFA;VENTOLIN HFA) 108 (90 Base) MCG/ACT inhaler Inhale 2 puffs into the lungs every 6 (six) hours as needed for wheezing or shortness of breath. 1 Inhaler 2  . albuterol (PROVENTIL) (5 MG/ML) 0.5% nebulizer solution Take 0.5 mLs (2.5 mg total) by nebulization every 6 (six) hours as needed for wheezing or shortness of breath. 20 mL 0  . budesonide-formoterol (SYMBICORT) 160-4.5 MCG/ACT inhaler Inhale 2 puffs into the lungs 2  (two) times daily. 1 Inhaler 12  . fluticasone (FLONASE) 50 MCG/ACT nasal spray Place 2 sprays into both nostrils daily. 16 g 6  . omeprazole (PRILOSEC) 20 MG capsule Take 1 capsule (20 mg total) by mouth daily before breakfast. 30 capsule 4  . azithromycin (ZITHROMAX) 250 MG tablet Take two once then one daily until gone (Patient not taking: Reported on 03/24/2017) 6 tablet 0  . predniSONE (DELTASONE) 10 MG tablet Take 4 for three days 3 for three days 2 for three days 1 for three days and stop (Patient not taking: Reported on 03/24/2017) 30 tablet 0   No facility-administered medications prior to visit.       Review of Systems  Constitutional: Negative for appetite change and malaise/fatigue.  HENT: Positive for hoarse voice, postnasal drip, rhinorrhea and trouble swallowing. Negative for ear pain, sneezing and sore throat.   Respiratory: Positive for shortness of breath and wheezing. Negative for cough, hemoptysis and sputum production.   Cardiovascular: Positive for chest pain, dyspnea on exertion and PND.  Gastrointestinal: Negative for heartburn.  Musculoskeletal: Negative for myalgias.  Neurological: Positive for headaches.       Objective:   Physical Exam  Vitals:   03/24/17 1001  BP: 120/85  Pulse: 87  Resp: 16  Temp: 99 F (37.2 C)  TempSrc: Oral  SpO2: 99%  Weight: 250 lb 3.2 oz (113.5 kg)    Gen: Pleasant, obese, in no distress,  normal affect  ENT: No lesions,  mouth clear,  oropharynx clear, + postnasal drip, purulence has resolved in nares  Neck: No JVD, no TMG, no carotid bruits  Lungs: No use of accessory muscles, no dullness to percussion, clear  Cardiovascular: RRR, heart sounds normal, no murmur or gallops, no peripheral edlema  Abdomen: soft and NT, no HSM,  BS normal  Musculoskeletal: No deformities, no cyanosis or clubbing  Neuro: alert, non focal  Skin: Warm, no lesions or rashes  No results found.      Assessment & Plan:  I personally  reviewed all images and [facility] as well as any outside material available during this office visit and agree with the  radiology impressions.   Asthma, severe persistent Severe persistent asthma with mold exposure and sinusitis  Improved Plan Continue symbicort, prn saba, no further oral steroids, no abx Continue fluticasone nasal Return 3 months    Mold exposure Connect pt with social services ? Any way to get landlord to change air filters and help with mold issue in apts?   Paula Wiggins was seen today for follow-up.  Diagnoses and all orders for this visit:  Severe persistent asthma without complication  Mold exposure

## 2017-04-14 ENCOUNTER — Telehealth: Payer: Self-pay

## 2017-04-14 NOTE — Telephone Encounter (Signed)
Thank you Erskine SquibbJane.  Dr Delford FieldWright

## 2017-04-14 NOTE — Telephone Encounter (Signed)
Spoke with Ranee GosselinMaria Ramirez Perez, [employer] [place]. She stated that the patient was contacted in regards to the referral made to [employer] for mold in her apartment vents.  It was determined that she is over income for their services and she has been referred to [employer] for assistance.

## 2017-04-28 ENCOUNTER — Other Ambulatory Visit: Payer: Self-pay

## 2017-04-28 ENCOUNTER — Ambulatory Visit: Payer: PRIVATE HEALTH INSURANCE | Attending: Internal Medicine | Admitting: Physician Assistant

## 2017-04-28 VITALS — BP 101/72 | HR 116 | Temp 98.4°F | Wt 258.4 lb

## 2017-04-28 DIAGNOSIS — J4550 Severe persistent asthma, uncomplicated: Secondary | ICD-10-CM | POA: Diagnosis present

## 2017-04-28 DIAGNOSIS — Z113 Encounter for screening for infections with a predominantly sexual mode of transmission: Secondary | ICD-10-CM

## 2017-04-28 DIAGNOSIS — Z79899 Other long term (current) drug therapy: Secondary | ICD-10-CM | POA: Diagnosis not present

## 2017-04-28 MED ORDER — ALBUTEROL SULFATE (5 MG/ML) 0.5% IN NEBU: 2.5000 mg | INHALATION_SOLUTION | Freq: Four times a day (QID) | RESPIRATORY_TRACT | 0 refills | Status: DC | PRN

## 2017-04-28 MED ORDER — ALBUTEROL SULFATE HFA 108 (90 BASE) MCG/ACT IN AERS
2.0000 | INHALATION_SPRAY | Freq: Four times a day (QID) | RESPIRATORY_TRACT | 2 refills | Status: DC | PRN
Start: 2017-04-28 — End: 2017-11-24

## 2017-04-28 MED ORDER — ALBUTEROL SULFATE (2.5 MG/3ML) 0.083% IN NEBU: 2.5000 mg | INHALATION_SOLUTION | Freq: Four times a day (QID) | RESPIRATORY_TRACT | 12 refills | Status: DC | PRN

## 2017-04-28 MED ORDER — ALBUTEROL SULFATE (2.5 MG/3ML) 0.083% IN NEBU
2.5000 mg | INHALATION_SOLUTION | Freq: Once | RESPIRATORY_TRACT | Status: AC
  Administered 2017-04-28: 2.5 mg via RESPIRATORY_TRACT

## 2017-04-28 MED ORDER — BUDESONIDE-FORMOTEROL FUMARATE 160-4.5 MCG/ACT IN AERO: 2.0000 | INHALATION_SPRAY | Freq: Two times a day (BID) | RESPIRATORY_TRACT | 12 refills | Status: DC

## 2017-04-28 NOTE — Progress Notes (Signed)
Screening for STI's  Needs a new inhaler: frequent asthmas attack.

## 2017-04-28 NOTE — Progress Notes (Signed)
Patient ID: Paula Wiggins, female   DOB: 1990-07-19, 26 y.o.   MRN: [id number]   Paula Wiggins, is a 26 y.o. female  [id number]  [id number]  DOB - 1990-07-19  Subjective:  Chief Complaint and HPI: Paula Wiggins is a 26 y.o. female here today to establish care.  She used to be a patient here but hasn't been seen here in years.  Uncontrolled asthma with multiple ED visits.  She is out of symbicort though it looks like RF were put on it when it was prescribed.  She has been wheezing a lot.  She is out of neb RF. And only has a partial albuterol inhaler.  She gets more relief out of a neb treatment when she is out of symbicort and would like to do a neb treatment while here today.     She is requesting STI screening.  No high risk behavior or known exposure.  No f/c.     ROS:   Constitutional:  No f/c, No night sweats, No unexplained weight loss. EENT:  No vision changes, No blurry vision, No hearing changes. No mouth, throat, or ear problems.  Respiratory: some cough with wheezing Cardiac: No CP, no palpitations GI:  No abd pain, No N/V/D. GU: No Urinary s/sx Musculoskeletal: No joint pain Neuro: No headache, no dizziness, no motor weakness.  Skin: No rash Endocrine:  No polydipsia. No polyuria.  Psych: Denies SI/HI  No problems updated.  ALLERGIES: Allergies  Allergen Reactions  . Oxycodone Anaphylaxis, Shortness Of Breath and Swelling  . Watermelon Flavor Swelling and Other (See Comments)    Swelling of tongue and lips break out    PAST MEDICAL HISTORY: Past Medical History:  Diagnosis Date  . Asthma     MEDICATIONS AT HOME: Prior to Admission medications   Medication Sig Start Date End Date Taking? Authorizing Provider  albuterol (PROVENTIL HFA;VENTOLIN HFA) 108 (90 Base) MCG/ACT inhaler Inhale 2 puffs into the lungs every 6 (six) hours as needed for wheezing or shortness of breath. 04/28/17   Anders SimmondsMcClung, Angela M, PA-C  albuterol (PROVENTIL) (5 MG/ML) 0.5%  nebulizer solution Take 0.5 mLs (2.5 mg total) by nebulization every 6 (six) hours as needed for wheezing or shortness of breath. 04/28/17   Anders SimmondsMcClung, Angela M, PA-C  budesonide-formoterol (SYMBICORT) 160-4.5 MCG/ACT inhaler Inhale 2 puffs into the lungs 2 (two) times daily. 04/28/17 04/28/18  Anders SimmondsMcClung, Angela M, PA-C  fluticasone (FLONASE) 50 MCG/ACT nasal spray Place 2 sprays into both nostrils daily. 03/10/17   Storm FriskWright, Patrick E, MD  omeprazole (PRILOSEC) 20 MG capsule Take 1 capsule (20 mg total) by mouth daily before breakfast. 03/10/17   Storm FriskWright, Patrick E, MD     Objective:  EXAM:   Vitals:   04/28/17 1519  BP: 101/72  Pulse: (!) 116  Temp: 98.4 F (36.9 C)  TempSrc: Oral  SpO2: 98%  Weight: 258 lb 6.4 oz (117.2 kg)    General appearance : A&OX3. NAD. Non-toxic-appearing HEENT: Atraumatic and Normocephalic.  PERRLA. EOM intact.   Neck: supple, no JVD. No cervical lymphadenopathy. No thyromegaly Chest/Lungs:  Breathing-non-labored, Good air entry bilaterally, breath sounds normal without rales or rhonchi, but there is mild to moderate wheezing throughout. CVS: S1 S2 regular, no murmurs, gallops, rubs  Extremities: Bilateral Lower Ext shows no edema, both legs are warm to touch with = pulse throughout Neurology:  CN II-XII grossly intact, Non focal.   Psych:  TP linear. J/I WNL. Normal speech. Appropriate eye contact and affect.  Skin:  No Rash  Data Review Lab Results  Component Value Date   HGBA1C 5.7 (H) 02/02/2014     Assessment & Plan   1. Severe persistent asthma without complication Appears compliance with meds has been a challenge - budesonide-formoterol (SYMBICORT) 160-4.5 MCG/ACT inhaler; Inhale 2 puffs into the lungs 2 (two) times daily.  Dispense: 1 Inhaler; Refill: 12 - albuterol (PROVENTIL HFA;VENTOLIN HFA) 108 (90 Base) MCG/ACT inhaler; Inhale 2 puffs into the lungs every 6 (six) hours as needed for wheezing or shortness of breath.  Dispense: 1 Inhaler;  Refill: 2 - albuterol (PROVENTIL) (5 MG/ML) 0.5% nebulizer solution; Take 0.5 mLs (2.5 mg total) by nebulization every 6 (six) hours as needed for wheezing or shortness of breath.  Dispense: 20 mL; Refill: 0 - albuterol (PROVENTIL) (2.5 MG/3ML) 0.083% nebulizer solution 2.5 mg in office today  2. Routine screening for STI (sexually transmitted infection) - HIV antibody (with reflex) - RPR - Urine cytology ancillary only Repeat annually or with high risk encounters.     Patient have been counseled extensively about nutrition and exercise  Return in about 1 month (around 05/29/2017) for assign PCP; f/up asthma.  The patient was given clear instructions to go to ER or return to medical center if symptoms don't improve, worsen or new problems develop. The patient verbalized understanding. The patient was told to call to get lab results if they haven't heard anything in the next week.     Georgian CoAngela McClung, PA-C [facility] [place], [place] [phone]   04/28/2017, 3:45 PM

## 2017-04-29 ENCOUNTER — Other Ambulatory Visit: Payer: Self-pay | Admitting: Physician Assistant

## 2017-04-29 DIAGNOSIS — A599 Trichomoniasis, unspecified: Secondary | ICD-10-CM

## 2017-04-29 LAB — HIV ANTIBODY (ROUTINE TESTING W REFLEX): HIV Screen 4th Generation wRfx: NONREACTIVE

## 2017-04-29 LAB — RPR: RPR Ser Ql: NONREACTIVE

## 2017-04-29 LAB — URINE CYTOLOGY ANCILLARY ONLY
Chlamydia: NEGATIVE
Neisseria Gonorrhea: NEGATIVE
Trichomonas: POSITIVE — AB

## 2017-04-29 MED ORDER — METRONIDAZOLE 500 MG PO TABS: 500.0000 mg | ORAL_TABLET | Freq: Two times a day (BID) | ORAL | 0 refills | Status: DC

## 2017-05-03 ENCOUNTER — Other Ambulatory Visit: Payer: Self-pay | Admitting: Physician Assistant

## 2017-05-03 LAB — URINE CYTOLOGY ANCILLARY ONLY: CANDIDA VAGINITIS: NEGATIVE

## 2017-05-05 ENCOUNTER — Other Ambulatory Visit: Payer: Self-pay | Admitting: Physician Assistant

## 2017-05-05 MED ORDER — FLUTICASONE-SALMETEROL 250-50 MCG/DOSE IN AEPB: 1.0000 | INHALATION_SPRAY | Freq: Two times a day (BID) | RESPIRATORY_TRACT | 3 refills | Status: DC

## 2017-05-08 ENCOUNTER — Encounter (HOSPITAL_COMMUNITY): Payer: Self-pay

## 2017-05-08 ENCOUNTER — Emergency Department (HOSPITAL_COMMUNITY): Payer: PRIVATE HEALTH INSURANCE

## 2017-05-08 DIAGNOSIS — Z87891 Personal history of nicotine dependence: Secondary | ICD-10-CM | POA: Diagnosis not present

## 2017-05-08 DIAGNOSIS — Z79899 Other long term (current) drug therapy: Secondary | ICD-10-CM | POA: Insufficient documentation

## 2017-05-08 DIAGNOSIS — R062 Wheezing: Secondary | ICD-10-CM | POA: Diagnosis present

## 2017-05-08 DIAGNOSIS — J45901 Unspecified asthma with (acute) exacerbation: Secondary | ICD-10-CM | POA: Insufficient documentation

## 2017-05-08 MED ORDER — ALBUTEROL SULFATE (2.5 MG/3ML) 0.083% IN NEBU
5.0000 mg | INHALATION_SOLUTION | Freq: Once | RESPIRATORY_TRACT | Status: AC
  Administered 2017-05-09: 5 mg via RESPIRATORY_TRACT
  Filled 2017-05-08: qty 6

## 2017-05-08 NOTE — ED Triage Notes (Signed)
Pt complains of an asthma attack all week with no relief from her medications

## 2017-05-09 ENCOUNTER — Emergency Department (HOSPITAL_COMMUNITY)
Admission: EM | Admit: 2017-05-09 | Discharge: 2017-05-09 | Disposition: A | Discharge status: Home / Self Care | Payer: PRIVATE HEALTH INSURANCE | Attending: Emergency Medicine | Admitting: Emergency Medicine

## 2017-05-09 DIAGNOSIS — J4541 Moderate persistent asthma with (acute) exacerbation: Secondary | ICD-10-CM

## 2017-05-09 LAB — I-STAT CHEM 8, ED
BUN: 13 mg/dL (ref 6–20)
CALCIUM ION: 1.2 mmol/L (ref 1.15–1.40)
CHLORIDE: 102 mmol/L (ref 101–111)
Creatinine, Ser: 0.7 mg/dL (ref 0.44–1.00)
GLUCOSE: 134 mg/dL — AB (ref 65–99)
HCT: 36 % (ref 36.0–46.0)
Hemoglobin: 12.2 g/dL (ref 12.0–15.0)
Potassium: 3.8 mmol/L (ref 3.5–5.1)
SODIUM: 142 mmol/L (ref 135–145)
TCO2: 25 mmol/L (ref 22–32)

## 2017-05-09 LAB — CBC
HEMATOCRIT: 36.7 % (ref 36.0–46.0)
HEMOGLOBIN: 12.2 g/dL (ref 12.0–15.0)
MCH: 32.5 pg (ref 26.0–34.0)
MCHC: 33.2 g/dL (ref 30.0–36.0)
MCV: 97.9 fL (ref 78.0–100.0)
Platelets: 311 10*3/uL (ref 150–400)
RBC: 3.75 MIL/uL — ABNORMAL LOW (ref 3.87–5.11)
RDW: 12.9 % (ref 11.5–15.5)
WBC: 14 10*3/uL — ABNORMAL HIGH (ref 4.0–10.5)

## 2017-05-09 LAB — I-STAT BETA HCG BLOOD, ED (MC, WL, AP ONLY): I-stat hCG, quantitative: <5 m[IU]/mL (ref <5)

## 2017-05-09 MED ORDER — PREDNISONE 10 MG PO TABS: 60.0000 mg | ORAL_TABLET | Freq: Every day | ORAL | 0 refills | Status: DC

## 2017-05-09 MED ORDER — METHYLPREDNISOLONE SODIUM SUCC 125 MG IJ SOLR
125.0000 mg | Freq: Once | INTRAMUSCULAR | Status: AC
  Administered 2017-05-09: 125 mg via INTRAVENOUS
  Filled 2017-05-09: qty 2

## 2017-05-09 MED ORDER — ALBUTEROL (5 MG/ML) CONTINUOUS INHALATION SOLN
10.0000 mg/h | INHALATION_SOLUTION | Freq: Once | RESPIRATORY_TRACT | Status: AC
  Administered 2017-05-09: 10 mg/h via RESPIRATORY_TRACT
  Filled 2017-05-09: qty 20

## 2017-05-09 NOTE — ED Notes (Signed)
Pt ambulated with pulse oximetry. Initial oxygen saturation 97% and it dropped as low as 94% while ambulating.

## 2017-05-09 NOTE — ED Provider Notes (Signed)
[facility]-EMERGENCY DEPT Provider Note   CSN: [id number] Arrival date & time: 05/08/17  2305     History   Chief Complaint Chief Complaint  Patient presents with  . asthma attack    HPI Paula Wiggins is a [age] y.o. female.  HPI  SUBJECTIVE:  Paula Wiggins is a 26 y.o. female seen urgently with exacerbation of asthma for 5 days. Wheezing is described as moderate to severe. Associated symptoms:congestion, productive cough and cough described as productive of yellow and green sputum. Patient denies smoke cigarettes. Patient is taking her medications as prescribed.  Patient has poorly controlled asthma.  She reports almost monthly ED visits.  Patient has required admission to the hospital this year for asthma exacerbation.  Patient denies any fevers, chills.  Patient typically gets productive cough with her asthma exacerbation. Pt has no hx of PE, DVT and denies any exogenous hormone (testosterone / estrogen) use, long distance travels or surgery in the past 6 weeks, active cancer, recent immobilization.   Past Medical History:  Diagnosis Date  . Asthma     Patient Active Problem List   Diagnosis Date Noted  . GERD (gastroesophageal reflux disease) 03/10/2017  . Mold exposure 03/10/2017  . Asthma, severe persistent 02/13/2017    History reviewed. No pertinent surgical history.  OB History    No data available       Home Medications    Prior to Admission medications   Medication Sig Start Date End Date Taking? Authorizing Provider  albuterol (PROVENTIL HFA;VENTOLIN HFA) 108 (90 Base) MCG/ACT inhaler Inhale 2 puffs into the lungs every 6 (six) hours as needed for wheezing or shortness of breath. 04/28/17  Yes Georgian CoMcClung, Angela M, PA-C  albuterol (PROVENTIL) (2.5 MG/3ML) 0.083% nebulizer solution Take 3 mLs (2.5 mg total) by nebulization every 6 (six) hours as needed for wheezing or shortness of breath. 04/28/17  Yes McClung, Angela M, PA-C    fluticasone (FLONASE) 50 MCG/ACT nasal spray Place 2 sprays into both nostrils daily. Patient taking differently: Place 2 sprays into both nostrils daily as needed for allergies or rhinitis.  03/10/17  Yes Storm FriskWright, Patrick E, MD  Fluticasone-Salmeterol (ADVAIR DISKUS) 250-50 MCG/DOSE AEPB Inhale 1 puff into the lungs 2 (two) times daily. 05/05/17  Yes McClung, Angela M, PA-C  omeprazole (PRILOSEC) 20 MG capsule Take 1 capsule (20 mg total) by mouth daily before breakfast. Patient taking differently: Take 20 mg by mouth daily as needed (reflux).  03/10/17  Yes Storm FriskWright, Patrick E, MD  metroNIDAZOLE (FLAGYL) 500 MG tablet Take 1 tablet (500 mg total) by mouth 2 (two) times daily. Patient not taking: Reported on 05/09/2017 04/29/17   Anders SimmondsMcClung, Angela M, PA-C  predniSONE (DELTASONE) 10 MG tablet Take 6 tablets (60 mg total) by mouth daily. 05/09/17   Derwood KaplanNanavati, Kegan Mckeithan, MD    Family History Family History  Problem Relation Age of Onset  . Hypertension Mother   . Asthma Mother   . Seizures Father   . Hypertension Father   . Congestive Heart Failure Paternal Grandfather   . Asthma Sister   . Emphysema Maternal Grandmother   . Asthma Sister   . Multiple sclerosis Paternal Aunt     Social History Social History   Tobacco Use  . Smoking status: Former Smoker    Types: Cigarettes  . Smokeless tobacco: Never Used  . Tobacco comment: Smoked for 1 month total.   Substance Use Topics  . Alcohol use: No  . Drug use: No  Allergies   Oxycodone and Watermelon flavor   Review of Systems Review of Systems  Constitutional: Positive for activity change. Negative for fever.  Respiratory: Positive for cough, shortness of breath and wheezing.   Cardiovascular: Positive for chest pain.  Allergic/Immunologic: Negative for immunocompromised state.  Hematological: Does not bruise/bleed easily.     Physical Exam Updated Vital Signs BP 99/75   Pulse (!) 124   Temp 98.6 F (37 C) (Oral)    Resp 16   LMP 05/03/2017   SpO2 97%   Physical Exam  Constitutional: She is oriented to person, place, and time. She appears well-developed.  HENT:  Head: Normocephalic and atraumatic.  Eyes: EOM are normal.  Neck: Normal range of motion. Neck supple.  Cardiovascular: Normal rate.  Pulmonary/Chest: Effort normal. No respiratory distress. She has wheezes.  Abdominal: Bowel sounds are normal.  Neurological: She is alert and oriented to person, place, and time.  Skin: Skin is warm and dry.  Nursing note and vitals reviewed.    ED Treatments / Results  Labs (all labs ordered are listed, but only abnormal results are displayed) Labs Reviewed  CBC - Abnormal; Notable for the following components:      Result Value   WBC 14.0 (*)    RBC 3.75 (*)    All other components within normal limits  I-STAT CHEM 8, ED - Abnormal; Notable for the following components:   Glucose, Bld 134 (*)    All other components within normal limits  I-STAT BETA HCG BLOOD, ED ([facility], [facility], [facility] ONLY)    EKG  EKG Interpretation None       Radiology Dg Chest 2 View  Result Date: 05/09/2017 CLINICAL DATA:  Acute onset of shortness of breath and cough. EXAM: CHEST  2 VIEW COMPARISON:  Chest radiograph performed 03/03/2017 FINDINGS: The lungs are well-aerated. Mild peribronchial thickening is noted There is no evidence of focal opacification, pleural effusion or pneumothorax. The heart is normal in size; the mediastinal contour is within normal limits. No acute osseous abnormalities are seen. IMPRESSION: Mild peribronchial thickening noted.  Lungs otherwise clear. Electronically Signed   By: Roanna RaiderJeffery  Chang M.D.   On: 05/09/2017 00:05    Procedures Procedures (including critical care time)  Medications Ordered in ED Medications  albuterol (PROVENTIL) (2.5 MG/3ML) 0.083% nebulizer solution 5 mg (5 mg Nebulization Given 05/09/17 0020)  albuterol (PROVENTIL,VENTOLIN) solution continuous neb (10 mg/hr  Nebulization Given 05/09/17 0253)  methylPREDNISolone sodium succinate (SOLU-MEDROL) 125 mg/2 mL injection 125 mg (125 mg Intravenous Given 05/09/17 0247)     Initial Impression / Assessment and Plan / ED Course  I have reviewed the triage vital signs and the nursing notes.  Pertinent labs & imaging results that were available during my care of the patient were reviewed by me and considered in my medical decision making (see chart for details).  Clinical Course as of May 10 515  Sun May 09, 2017  [id number] Repeat exam reveals improvement of wheezing in all lung fields. Patient is not in any respiratory distress nor is there hypoxia. Will ambulate post treatment and reassess.   [AN]  [id number] Patient ambulated, and reports that she felt okay.  O2 sats dropped to 95%.  Patient feels comfortable going home.  Strict return precautions have been discussed.  [AN]    Clinical Course User Index [AN] Derwood KaplanNanavati, Adisen Bennion, MD    Patient comes in with chief complaint of wheezing. Patient has history of poorly controlled asthma.  Patient's current symptoms  have been going on for a few days, and getting worse.  Patient has been taking medications at home with minimal relief.  Patient is not in any respiratory distress.  She is noted to be tachypneic and tachycardic.  Patient has received one nebulizer treatment already, and states that she does feel slightly better.  We will order hour-long nebulizer treatment and reassess.  Based on history and exam I doubt that patient has underlying pneumonia.  Chest x-ray not indicated. Patient does not have any PE risk factors.  Final Clinical Impressions(s) / ED Diagnoses   Final diagnoses:  Moderate persistent asthma with acute exacerbation    ED Discharge Orders        Ordered    predniSONE (DELTASONE) 10 MG tablet  Daily     05/09/17 0516       Derwood Kaplan, MD 05/09/17 [phone]

## 2017-05-09 NOTE — Discharge Instructions (Signed)
We saw you in the ER for your asthma related complains. We gave you some breathing treatments in the ER, and seems like your symptoms have improved. Please take albuterol as needed every 4 hours. Please take the medications prescribed. Please refrain from smoking or smoke exposure. Please see a primary care doctor in 1 week. Return to the ER if your symptoms worsen.  

## 2017-06-25 ENCOUNTER — Ambulatory Visit: Payer: Self-pay | Admitting: Nurse Practitioner

## 2017-08-22 IMAGING — CR DG FINGER MIDDLE 2+V*L*
3 series · 3 of 3 positions shown · non-contrast
Comparison: None.

CLINICAL DATA: 24-year-old female with left middle finger pain post
trauma. Initial encounter.

EXAM:
LEFT MIDDLE FINGER 2+V

[x finger pa left]
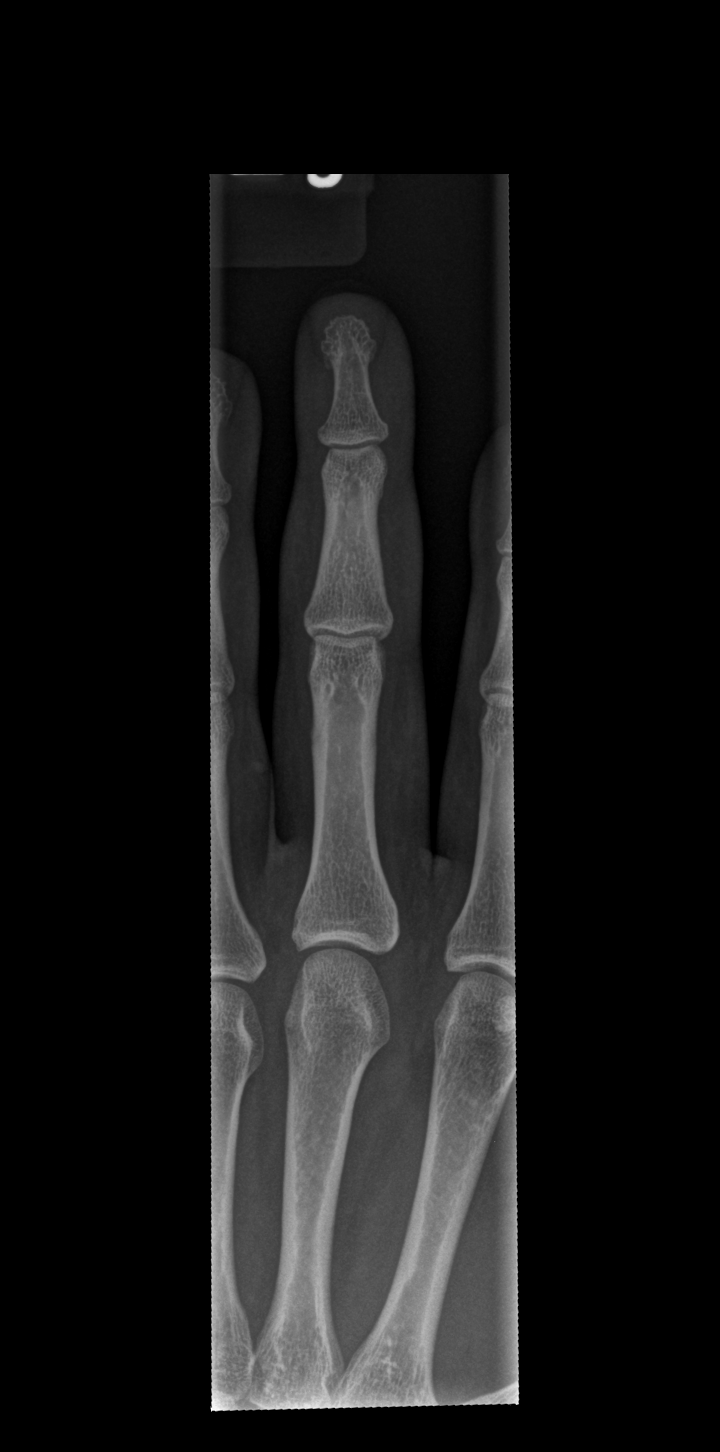

[x finger obl left]
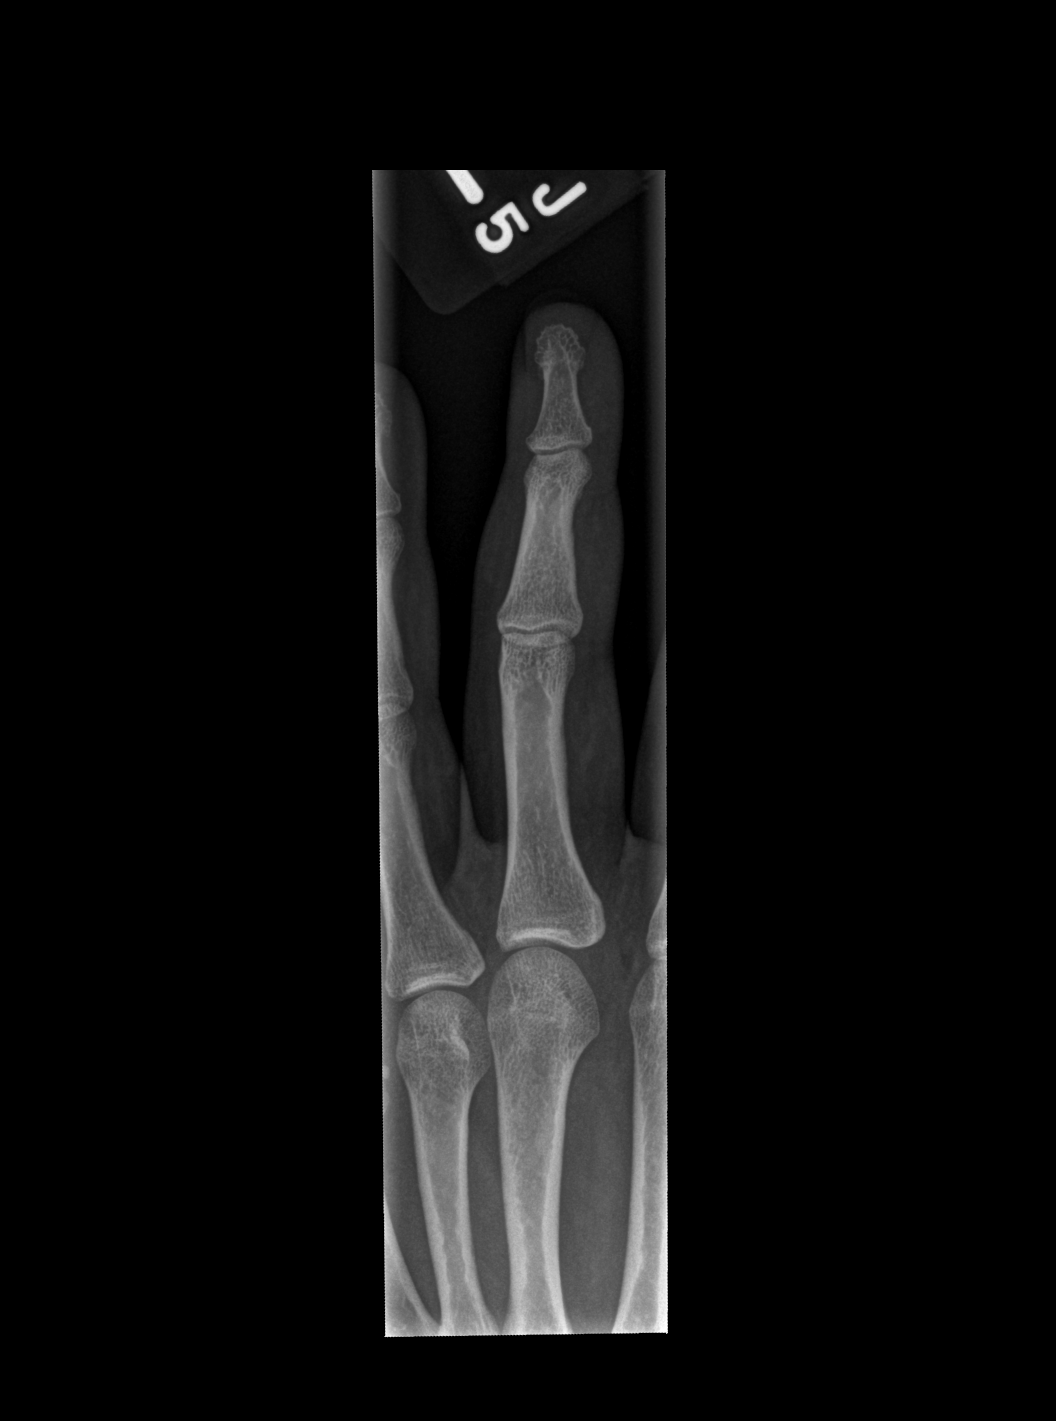

[x finger lat left]
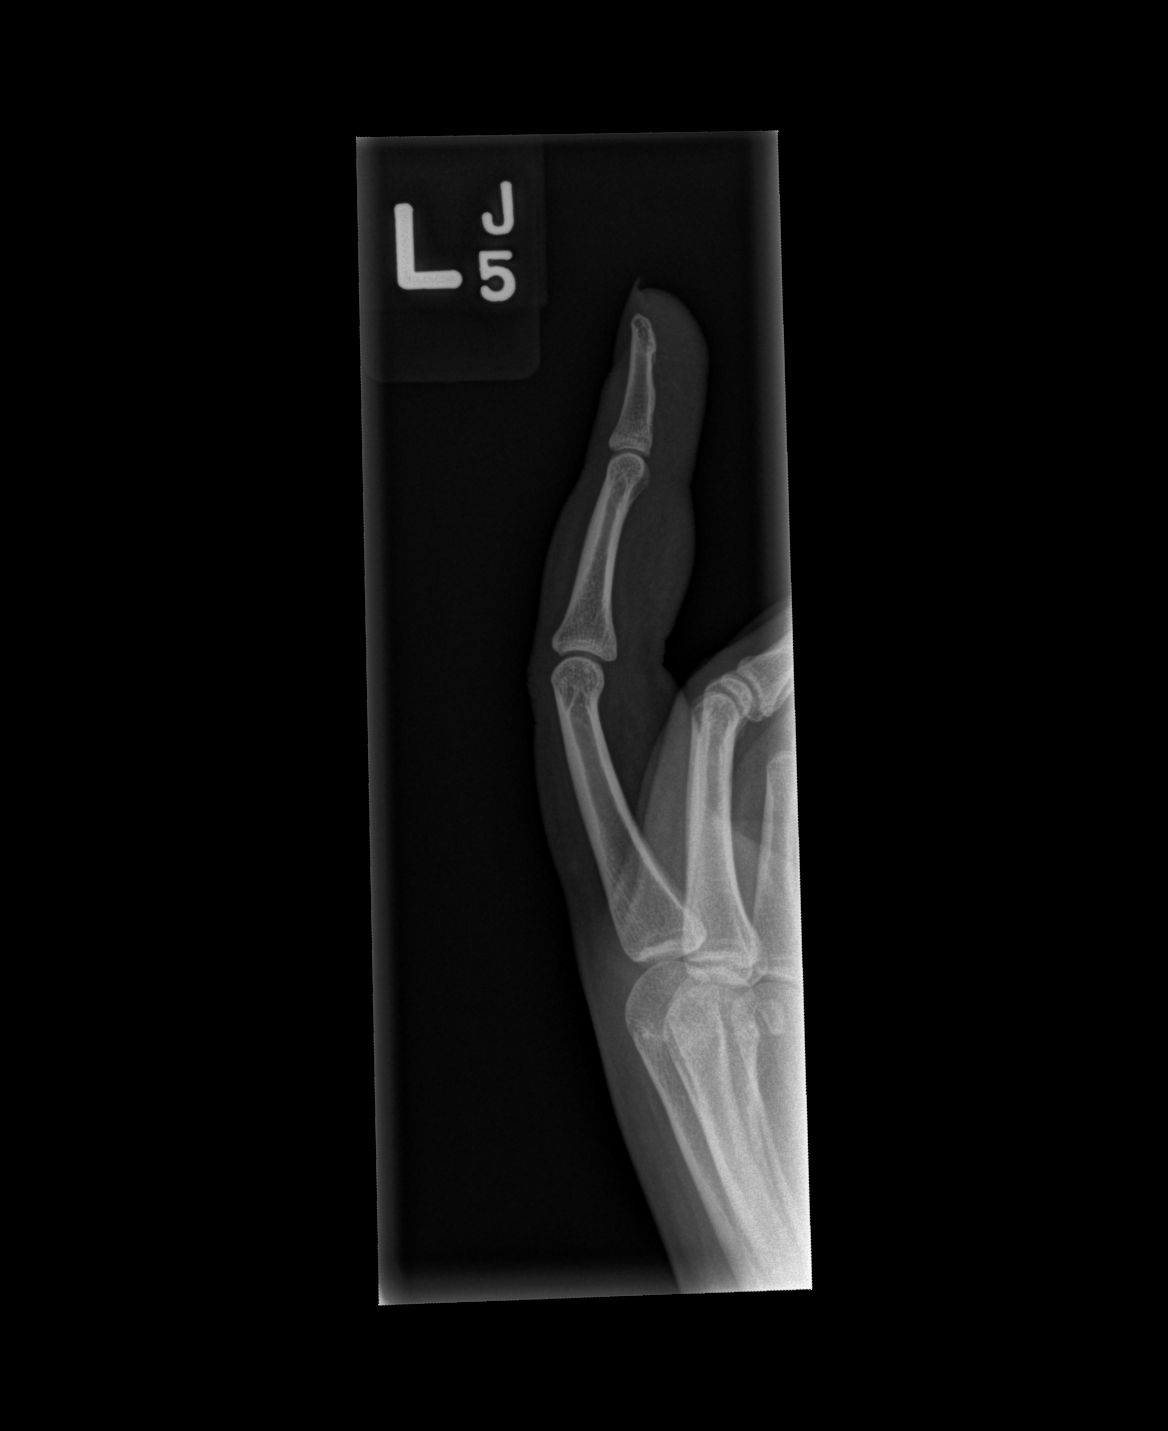

[3 of 3 positions shown; findings below may reference images not displayed]

FINDINGS: No fracture or dislocation.

No malalignment.
IMPRESSION: No fracture or dislocation.

## 2017-08-24 ENCOUNTER — Encounter (HOSPITAL_COMMUNITY): Payer: Self-pay | Admitting: *Deleted

## 2017-08-24 ENCOUNTER — Emergency Department (HOSPITAL_COMMUNITY)
Admission: EM | Admit: 2017-08-24 | Discharge: 2017-08-24 | Disposition: A | Discharge status: Home / Self Care | Payer: PRIVATE HEALTH INSURANCE | Attending: Emergency Medicine | Admitting: Emergency Medicine

## 2017-08-24 DIAGNOSIS — Z87891 Personal history of nicotine dependence: Secondary | ICD-10-CM | POA: Diagnosis not present

## 2017-08-24 DIAGNOSIS — J4550 Severe persistent asthma, uncomplicated: Secondary | ICD-10-CM | POA: Diagnosis not present

## 2017-08-24 DIAGNOSIS — M25511 Pain in right shoulder: Secondary | ICD-10-CM

## 2017-08-24 DIAGNOSIS — Z79899 Other long term (current) drug therapy: Secondary | ICD-10-CM | POA: Insufficient documentation

## 2017-08-24 MED ORDER — METHOCARBAMOL 750 MG PO TABS: 750.0000 mg | ORAL_TABLET | Freq: Three times a day (TID) | ORAL | 0 refills | Status: DC | PRN

## 2017-08-24 NOTE — Discharge Instructions (Addendum)

## 2017-08-24 NOTE — ED Provider Notes (Signed)
[facility]-EMERGENCY DEPT Provider Note   CSN: [id number] Arrival date & time: 08/24/17  1433     History   Chief Complaint Chief Complaint  Patient presents with  . Shoulder Pain    HPI Paula Wiggins is a 27 y.o. female with a past medical history of asthma who presents today for evaluation of approximately 5 days of right shoulder pain.  She reports that she was outside gardening with her mother when she picked up an heavy object and felt a pop in her shoulder.  She reports that the pain is on the back of her shoulder and over the past few days has progressed to include the right side of her neck and her upper back.  She denies any fevers or chills.  Denies any numbness or tingling to her right arm.  Patient adamantly denies possibility of pregnancy, declines pregnancy test prior to NSAIDs/Robaxin.  HPI  Past Medical History:  Diagnosis Date  . Asthma     Patient Active Problem List   Diagnosis Date Noted  . GERD (gastroesophageal reflux disease) 03/10/2017  . Mold exposure 03/10/2017  . Asthma, severe persistent 02/13/2017    History reviewed. No pertinent surgical history.   OB History   None      Home Medications    Prior to Admission medications   Medication Sig Start Date End Date Taking? Authorizing Provider  albuterol (PROVENTIL HFA;VENTOLIN HFA) 108 (90 Base) MCG/ACT inhaler Inhale 2 puffs into the lungs every 6 (six) hours as needed for wheezing or shortness of breath. 04/28/17   Anders Simmonds, PA-C  albuterol (PROVENTIL) (2.5 MG/3ML) 0.083% nebulizer solution Take 3 mLs (2.5 mg total) by nebulization every 6 (six) hours as needed for wheezing or shortness of breath. 04/28/17   Anders Simmonds, PA-C  fluticasone (FLONASE) 50 MCG/ACT nasal spray Place 2 sprays into both nostrils daily. Patient taking differently: Place 2 sprays into both nostrils daily as needed for allergies or rhinitis.  03/10/17   Storm Frisk, MD    Fluticasone-Salmeterol (ADVAIR DISKUS) 250-50 MCG/DOSE AEPB Inhale 1 puff into the lungs 2 (two) times daily. 05/05/17   Anders Simmonds, PA-C  methocarbamol (ROBAXIN) 750 MG tablet Take 1-2 tablets (750-1,500 mg total) by mouth 3 (three) times daily as needed for muscle spasms. 08/24/17   Cristina Gong, PA-C  metroNIDAZOLE (FLAGYL) 500 MG tablet Take 1 tablet (500 mg total) by mouth 2 (two) times daily. Patient not taking: Reported on 05/09/2017 04/29/17   Anders Simmonds, PA-C  omeprazole (PRILOSEC) 20 MG capsule Take 1 capsule (20 mg total) by mouth daily before breakfast. Patient taking differently: Take 20 mg by mouth daily as needed (reflux).  03/10/17   Storm Frisk, MD  predniSONE (DELTASONE) 10 MG tablet Take 6 tablets (60 mg total) by mouth daily. 05/09/17   Derwood Kaplan, MD    Family History Family History  Problem Relation Age of Onset  . Hypertension Mother   . Asthma Mother   . Seizures Father   . Hypertension Father   . Congestive Heart Failure Paternal Grandfather   . Asthma Sister   . Emphysema Maternal Grandmother   . Asthma Sister   . Multiple sclerosis Paternal Aunt     Social History Social History   Tobacco Use  . Smoking status: Former Smoker    Types: Cigarettes  . Smokeless tobacco: Never Used  . Tobacco comment: Smoked for 1 month total.   Substance Use Topics  .  Alcohol use: No  . Drug use: No     Allergies   Oxycodone and Watermelon flavor   Review of Systems Review of Systems  Constitutional: Negative for chills and fever.  Musculoskeletal:       Pain in right shoulder.  Skin: Negative for color change, rash and wound.  Neurological: Negative for weakness, numbness and headaches.  Psychiatric/Behavioral: Negative for confusion.  All other systems reviewed and are negative.    Physical Exam Updated Vital Signs BP 107/84 (BP Location: Right Arm)   Pulse 94   Temp 98.4 F (36.9 C) (Oral)   Resp 15   LMP  08/16/2017   SpO2 100%   Physical Exam  Constitutional: She appears well-developed and well-nourished. No distress.  HENT:  Head: Normocephalic.  Cardiovascular:  2+ right radial pulse.  Right hand is warm and well perfused.  Musculoskeletal:  There is tenderness to palpation along the right posterior shoulder which continues up the right side of her neck and on her right upper back generally over the area of the trapezius muscle.  Palpation in this area both re-creates and exacerbates her pain.  5/5 grip strength in bilateral upper extremities.  Patient has normal range of motion of elbow wrist, and right fingers, limited range of motion in right shoulder secondary to pain.  Neurological: She is alert.  Skin: Skin is warm. She is not diaphoretic.  Nursing note and vitals reviewed.    ED Treatments / Results  Labs (all labs ordered are listed, but only abnormal results are displayed) Labs Reviewed - No data to display  EKG None  Radiology No results found.  Procedures Procedures (including critical care time)  Medications Ordered in ED Medications - No data to display   Initial Impression / Assessment and Plan / ED Course  I have reviewed the triage vital signs and the nursing notes.  Pertinent labs & imaging results that were available during my care of the patient were reviewed by me and considered in my medical decision making (see chart for details).    Paula Wiggins presents with right shoulder pain after picking up a heavy object 5 days ago.  Symptoms and physical exam consistent with trapezius muscle spasm, cannot rule out rotator cuff injury.  Imaging was obtained without evidence of acute osseous abnormalities.  Distal extremity is warm and well perfused, neurovascularly intact with soft compartments. Patient will be given robaxin instructions on OTC pain management instructions to follow up with their primary care doctor or orthopedics if their symptoms worsen or  do not improve in 5-7 days.  Patient instructed on RICE and other conservative treatments.  Patient given return precautions, states understanding of plan and diagnosis.   Final Clinical Impressions(s) / ED Diagnoses   Final diagnoses:  Acute pain of right shoulder    ED Discharge Orders        Ordered    methocarbamol (ROBAXIN) 750 MG tablet  3 times daily PRN     08/24/17 1824       Cristina GongHammond, Elizabeth W, PA-C 08/24/17 1832    Jacalyn LefevreHaviland, Julie, MD 08/24/17 1946

## 2017-08-24 NOTE — ED Triage Notes (Signed)
Pt complains of right shoulder pain x 5 days since picking up something in her yard and hearing a popping noise.

## 2017-09-10 LAB — BLOOD GAS, ARTERIAL
ACID-BASE DEFICIT: 3.9 mmol/L — AB (ref 0.0–2.0)
Bicarbonate: 19.1 mmol/L — ABNORMAL LOW (ref 20.0–28.0)
DRAWN BY: 514251
FIO2: 21
O2 SAT: 93.5 %
PCO2 ART: 29.9 mmHg — AB (ref 32.0–48.0)
Patient temperature: 98.6
pH, Arterial: 7.422 (ref 7.350–7.450)
pO2, Arterial: 72.5 mmHg — ABNORMAL LOW (ref 83.0–108.0)

## 2017-09-22 ENCOUNTER — Other Ambulatory Visit: Payer: Self-pay | Admitting: Physician Assistant

## 2017-09-22 ENCOUNTER — Other Ambulatory Visit: Payer: Self-pay | Admitting: Critical Care Medicine

## 2017-11-24 ENCOUNTER — Other Ambulatory Visit: Payer: Self-pay | Admitting: Physician Assistant

## 2017-11-24 DIAGNOSIS — J4550 Severe persistent asthma, uncomplicated: Secondary | ICD-10-CM

## 2018-03-01 ENCOUNTER — Other Ambulatory Visit: Payer: Self-pay | Admitting: Family Medicine

## 2018-03-01 ENCOUNTER — Other Ambulatory Visit: Payer: Self-pay | Admitting: Physician Assistant

## 2018-03-01 DIAGNOSIS — J4550 Severe persistent asthma, uncomplicated: Secondary | ICD-10-CM

## 2018-05-05 ENCOUNTER — Other Ambulatory Visit: Payer: Self-pay | Admitting: Family Medicine

## 2018-05-05 ENCOUNTER — Other Ambulatory Visit: Payer: Self-pay | Admitting: Physician Assistant

## 2018-05-05 DIAGNOSIS — J4550 Severe persistent asthma, uncomplicated: Secondary | ICD-10-CM

## 2018-05-17 ENCOUNTER — Other Ambulatory Visit: Payer: Self-pay | Admitting: Physician Assistant

## 2018-05-17 ENCOUNTER — Other Ambulatory Visit: Payer: Self-pay | Admitting: Family Medicine

## 2018-05-17 DIAGNOSIS — J4550 Severe persistent asthma, uncomplicated: Secondary | ICD-10-CM

## 2018-05-26 ENCOUNTER — Other Ambulatory Visit: Payer: Self-pay | Admitting: Family Medicine

## 2018-05-26 DIAGNOSIS — J4550 Severe persistent asthma, uncomplicated: Secondary | ICD-10-CM

## 2018-06-27 ENCOUNTER — Encounter: Payer: Self-pay | Admitting: Nurse Practitioner

## 2018-06-27 ENCOUNTER — Ambulatory Visit: Payer: Self-pay | Attending: Nurse Practitioner | Admitting: Nurse Practitioner

## 2018-06-27 VITALS — BP 128/91 | HR 97 | Temp 99.4°F | Ht 64.0 in | Wt 251.8 lb

## 2018-06-27 DIAGNOSIS — K219 Gastro-esophageal reflux disease without esophagitis: Secondary | ICD-10-CM | POA: Insufficient documentation

## 2018-06-27 DIAGNOSIS — Z7951 Long term (current) use of inhaled steroids: Secondary | ICD-10-CM | POA: Insufficient documentation

## 2018-06-27 DIAGNOSIS — R7303 Prediabetes: Secondary | ICD-10-CM | POA: Insufficient documentation

## 2018-06-27 DIAGNOSIS — Z885 Allergy status to narcotic agent status: Secondary | ICD-10-CM | POA: Insufficient documentation

## 2018-06-27 DIAGNOSIS — Z79899 Other long term (current) drug therapy: Secondary | ICD-10-CM | POA: Insufficient documentation

## 2018-06-27 DIAGNOSIS — Z6841 Body Mass Index (BMI) 40.0 and over, adult: Secondary | ICD-10-CM | POA: Insufficient documentation

## 2018-06-27 DIAGNOSIS — J4520 Mild intermittent asthma, uncomplicated: Secondary | ICD-10-CM | POA: Insufficient documentation

## 2018-06-27 DIAGNOSIS — F5101 Primary insomnia: Secondary | ICD-10-CM | POA: Insufficient documentation

## 2018-06-27 DIAGNOSIS — Z8249 Family history of ischemic heart disease and other diseases of the circulatory system: Secondary | ICD-10-CM | POA: Insufficient documentation

## 2018-06-27 LAB — POCT GLYCOSYLATED HEMOGLOBIN (HGB A1C): Hemoglobin A1C: 5.7 % — AB (ref 4.0–5.6)

## 2018-06-27 LAB — GLUCOSE, POCT (MANUAL RESULT ENTRY): POC GLUCOSE: 101 mg/dL — AB (ref 70–99)

## 2018-06-27 MED ORDER — ALBUTEROL SULFATE HFA 108 (90 BASE) MCG/ACT IN AERS: 2.0000 | INHALATION_SPRAY | Freq: Four times a day (QID) | RESPIRATORY_TRACT | 0 refills | Status: DC | PRN

## 2018-06-27 MED ORDER — BUDESONIDE-FORMOTEROL FUMARATE 160-4.5 MCG/ACT IN AERO: 2.0000 | INHALATION_SPRAY | Freq: Two times a day (BID) | RESPIRATORY_TRACT | 3 refills | Status: DC

## 2018-06-27 MED ORDER — ALBUTEROL SULFATE (2.5 MG/3ML) 0.083% IN NEBU: 2.5000 mg | INHALATION_SOLUTION | Freq: Four times a day (QID) | RESPIRATORY_TRACT | 0 refills | Status: DC | PRN

## 2018-06-27 MED ORDER — OMEPRAZOLE 20 MG PO CPDR: 20.0000 mg | DELAYED_RELEASE_CAPSULE | Freq: Every day | ORAL | 4 refills | Status: DC

## 2018-06-27 MED ORDER — TRAZODONE HCL 50 MG PO TABS: 50.0000 mg | ORAL_TABLET | Freq: Every day | ORAL | 2 refills | Status: DC

## 2018-06-27 NOTE — Patient Instructions (Signed)
Asthma Action Plan, Adult Introduction An asthma action plan helps you understand how to manage your asthma and what to do when you have an asthma attack. The action plan is a color-coded plan that lists the symptoms that indicate whether or not your condition is under control and what actions to take.  If you have symptoms in the green zone, it means you are doing well.  If you have symptoms in the yellow zone, it means you are having problems.  If you have symptoms in the red zone, you need medical care right away. Follow the plan that you and your health care provider develop. Review your plan with your health care provider at each visit. What triggers your asthma? Knowing the things that can trigger an asthma attack or make your asthma symptoms worse is very important. Talk to your health care provider about your asthma triggers and how to avoid them. Record your known asthma triggers here: _______________ What is your personal best peak flow reading? If you use a peak flow meter, determine your personal best reading. Record it here: _______________ Red zone Symptoms in this zone mean that you should get medical help right away. You will likely feel distressed and have symptoms at rest that restrict your activity. You are in the red zone if:  You are breathing hard and quickly.  Your nose opens wide, your ribs show, and your neck muscles become visible when you breathe in.  Your lips, fingers, or toes are a bluish color.  You have trouble speaking in full sentences.  Your peak flow reading is less than __________ (less than 50% of your personal best).  Your symptoms do not improve within 15-20 minutes after you use your reliever or rescue medicine (bronchodilator). If you have any of these symptoms:  Call your local emergency services (911 in the [place] or go to the nearest emergency room.  Use your reliever or rescue medicine. ? Start a nebulizer treatment or take 2-4 puffs from  a metered-dose inhaler with a spacer. ? Repeat this action every 15-20 minutes until help arrives. Yellow zone Symptoms in this zone mean that your condition may be getting worse. You may have symptoms that interfere with exercise, are noticeably worse after exposure to triggers, or are worse at the first sign of a cold (upper respiratory infection). These may include:  Waking from sleep.  Coughing, especially at night or first thing in the morning.  Mild wheezing.  Chest tightness.  A peak flow reading that is __________ to __________ (50-79% of your personal best). If you have any of these symptoms:  Add the following medicine to the ones that you use daily: ? Reliever or rescue medicine and dosage: _______________ ? Additional medicine and dosage: _______________ Call your health care provider if:  You remain in the yellow zone for __________ hours.  You are using a reliever or rescue medicine more than 2-3 times a week. Green zone This zone means that your asthma is under control. You may not have any symptoms while you are in the green zone. This means that you:  Have no coughing or wheezing, even while you are working or playing.  Sleep through the night.  Are breathing well.  Have a peak flow reading that is above __________ (80% of your personal best or greater). If you are in the green zone, continue to manage your asthma as directed:  Take these medicines every day: ? Controller medicine and dosage: _______________ ? Controller medicine  and dosage: _______________ ? Controller medicine and dosage: _______________ ? Controller medicine and dosage: _______________  Before exercise, use this reliever or rescue medicine: _______________ Call your health care provider if you are using a reliever or rescue medicine more than 2-3 times a week. Where to find more information You can find more information about asthma from:  Centers for Disease Control and Prevention:  SendThoughts.com.pt  American Lung Association: www.lung.org This information is not intended to replace advice given to you by your health care provider. Make sure you discuss any questions you have with your health care provider. Document Released: 03/01/2009 Document Revised: 01/13/2017 Document Reviewed: 01/13/2017 Elsevier Interactive Patient Education  2019 [employer] Inc[employer]  Asthma, Adult  Asthma is a long-term (chronic) condition in which the airways get tight and narrow. The airways are the breathing passages that lead from the nose and mouth down into the lungs. A person with asthma will have times when symptoms get worse. These are called asthma attacks. They can cause coughing, whistling sounds when you breathe (wheezing), shortness of breath, and chest pain. They can make it hard to breathe. There is no cure for asthma, but medicines and lifestyle changes can help control it. There are many things that can bring on an asthma attack or make asthma symptoms worse (triggers). Common triggers include:  Mold.  Dust.  Cigarette smoke.  Cockroaches.  Things that can cause allergy symptoms (allergens). These include animal skin flakes (dander) and pollen from trees or grass.  Things that pollute the air. These may include household cleaners, wood smoke, smog, or chemical odors.  Cold air, weather changes, and wind.  Crying or laughing hard.  Stress.  Certain medicines or drugs.  Certain foods such as dried fruit, potato chips, and grape juice.  Infections, such as a cold or the flu.  Certain medical conditions or diseases.  Exercise or tiring activities. Asthma may be treated with medicines and by staying away from the things that cause asthma attacks. Types of medicines may include:  Controller medicines. These help prevent asthma symptoms. They are usually taken every day.  Fast-acting reliever or rescue medicines. These quickly relieve asthma symptoms. They are used as  needed and provide short-term relief.  Allergy medicines if your attacks are brought on by allergens.  Medicines to help control the body's defense (immune) system. Follow these instructions at home: Avoiding triggers in your home  Change your heating and air conditioning filter often.  Limit your use of fireplaces and wood stoves.  Get rid of pests (such as roaches and mice) and their droppings.  Throw away plants if you see mold on them.  Clean your floors. Dust regularly. Use cleaning products that do not smell.  Have someone vacuum when you are not home. Use a vacuum cleaner with a HEPA filter if possible.  Replace carpet with wood, tile, or vinyl flooring. Carpet can trap animal skin flakes and dust.  Use allergy-proof pillows, mattress covers, and box spring covers.  Wash bed sheets and blankets every week in hot water. Dry them in a dryer.  Keep your bedroom free of any triggers.  Avoid pets and keep windows closed when things that cause allergy symptoms are in the air.  Use blankets that are made of polyester or cotton.  Clean bathrooms and kitchens with bleach. If possible, have someone repaint the walls in these rooms with mold-resistant paint. Keep out of the rooms that are being cleaned and painted.  Wash your hands often with  and box spring covers.   Wash bed sheets and blankets every week in hot water. Dry them in a dryer.   Keep your bedroom free of any triggers.   Avoid pets and keep windows closed when things that cause allergy symptoms are in the air.   Use blankets that are made of polyester or cotton.   Clean bathrooms and kitchens with bleach. If possible, have someone repaint the walls in these rooms with mold-resistant paint. Keep out of the rooms that are being cleaned and painted.   Wash your hands often with soap and water. If soap and water are not available, use hand sanitizer.   Do not allow anyone to smoke in your home.  General instructions   Take over-the-counter and prescription medicines only as told by your doctor.  ? Talk with your doctor if you have questions about how or when to take your medicines.  ? Make note if you need to use your medicines more often than usual.   Do not use any products that contain nicotine or tobacco, such as cigarettes and e-cigarettes. If you need help quitting, ask your doctor.   Stay away from secondhand smoke.   Avoid doing things outdoors when allergen counts are high and when air quality is  low.   Wear a ski mask when doing outdoor activities in the winter. The mask should cover your nose and mouth. Exercise indoors on cold days if you can.   Warm up before you exercise. Take time to cool down after exercise.   Use a peak flow meter as told by your doctor. A peak flow meter is a tool that measures how well the lungs are working.   Keep track of the peak flow meter's readings. Write them down.   Follow your asthma action plan. This is a written plan for taking care of your asthma and treating your attacks.   Make sure you get all the shots (vaccines) that your doctor recommends. Ask your doctor about a flu shot and a pneumonia shot.   Keep all follow-up visits as told by your doctor. This is important.  Contact a doctor if:   You have wheezing, shortness of breath, or a cough even while taking medicine to prevent attacks.   The mucus you cough up (sputum) is thicker than usual.   The mucus you cough up changes from clear or white to yellow, green, gray, or bloody.   You have problems from the medicine you are taking, such as:  ? A rash.  ? Itching.  ? Swelling.  ? Trouble breathing.   You need reliever medicines more than 2-3 times a week.   Your peak flow reading is still at 50-79% of your personal best after following the action plan for 1 hour.   You have a fever.  Get help right away if:   You seem to be worse and are not responding to medicine during an asthma attack.   You are short of breath even at rest.   You get short of breath when doing very little activity.   You have trouble eating, drinking, or talking.   You have chest pain or tightness.   You have a fast heartbeat.   Your lips or fingernails start to turn blue.   You are light-headed or dizzy, or you faint.   Your peak flow is less than 50% of your personal best.   You feel too tired to breathe normally.  Summary  

## 2018-06-27 NOTE — Progress Notes (Signed)
Assessment & Plan:  Paula Wiggins was seen today for establish care.  Diagnoses and all orders for this visit:  Prediabetes -     Glucose (CBG) -     HgB A1c -     CBC -     CMP14+EGFR  Mild intermittent asthma in adult without complication -     budesonide-formoterol (SYMBICORT) 160-4.5 MCG/ACT inhaler; Inhale 2 puffs into the lungs 2 (two) times daily. -     albuterol (PROVENTIL) (2.5 MG/3ML) 0.083% nebulizer solution; Take 3 mLs (2.5 mg total) by nebulization every 6 (six) hours as needed for wheezing or shortness of breath. -     albuterol (PROVENTIL HFA;VENTOLIN HFA) 108 (90 Base) MCG/ACT inhaler; Inhale 2 puffs into the lungs every 6 (six) hours as needed for wheezing or shortness of breath.  Gastroesophageal reflux disease, esophagitis presence not specified -     omeprazole (PRILOSEC) 20 MG capsule; Take 1 capsule (20 mg total) by mouth daily before breakfast.  Primary insomnia -     traZODone (DESYREL) 50 MG tablet; Take 1 tablet (50 mg total) by mouth at bedtime.    Patient has been counseled on age-appropriate routine health concerns for screening and prevention. These are reviewed and up-to-date. Referrals have been placed accordingly. Immunizations are up-to-date or declined.    Subjective:   Chief Complaint  Patient presents with  . Establish Care   HPI Paula Wiggins 28 y.o. female presents to office today to establish care. She has a history of GERD, asthma, PNA, bronchitis and prediabetes. She has complaints of insomnia today.   Prediabetes Well controlled. She is morbidly obese and does not exercise. We discussed dietary and exercise modifications today. She does not take any oral diabetic agents.  Lab Results  Component Value Date   HGBA1C 5.7 (A) 06/27/2018    Asthma Chronic and has been poorly controlled in th past with frequent ED visits. with symbicort and albuterol inhaler and nebs. She has been evaluated by Dr. Joya Gaskins with pulmonology in the past  (2018) for asthma with reported mold exposure in her apartment. Today she reports no ED visits for asthma in over a year.   Insomnia Difficulty falling and staying asleep. Chronic. She will also need to be tested for sleep apnea. She endorses snoring, periods of possible apnea and is morbidly obese.Patient has been advised to apply for financial assistance and schedule to see our financial counselor.     Review of Systems  Constitutional: Negative for fever, malaise/fatigue and weight loss.  HENT: Negative.  Negative for nosebleeds.   Eyes: Negative.  Negative for blurred vision, double vision and photophobia.  Respiratory: Negative.  Negative for cough and shortness of breath.   Cardiovascular: Negative.  Negative for chest pain, palpitations and leg swelling.  Gastrointestinal: Negative.  Negative for heartburn, nausea and vomiting.  Musculoskeletal: Negative.  Negative for myalgias.  Neurological: Negative.  Negative for dizziness, focal weakness, seizures and headaches.  Psychiatric/Behavioral: Negative for suicidal ideas. The patient has insomnia.     Past Medical History:  Diagnosis Date  . Asthma     History reviewed. No pertinent surgical history.  Family History  Problem Relation Age of Onset  . Hypertension Mother   . Asthma Mother   . Seizures Father   . Hypertension Father   . Congestive Heart Failure Paternal Grandfather   . Asthma Sister   . Emphysema Maternal Grandmother   . Asthma Sister   . Multiple sclerosis Paternal Aunt  Social History Reviewed with no changes to be made today.   Outpatient Medications Prior to Visit  Medication Sig Dispense Refill  . ADVAIR DISKUS 250-50 MCG/DOSE AEPB INHALE 1 PUFF INTO THE LUNGS 2 (TWO) TIMES DAILY. 60 each 3  . albuterol (PROVENTIL) (2.5 MG/3ML) 0.083% nebulizer solution Take 3 mLs (2.5 mg total) by nebulization every 6 (six) hours as needed for wheezing or shortness of breath. MUST MAKE APPT FOR REFILLS 90 mL 0    . VENTOLIN HFA 108 (90 Base) MCG/ACT inhaler INHALE 2 PUFFS INTO THE LUNGS EVERY 6 HOURS AS NEEDED FOR WHEEZING OR SHORTNESS OF BREATH. 18 g 0  . fluticasone (FLONASE) 50 MCG/ACT nasal spray Place 2 sprays into both nostrils daily. (Patient not taking: Reported on 06/27/2018) 16 g 6  . ADVAIR DISKUS 250-50 MCG/DOSE AEPB INHALE 1 PUFF INTO THE LUNGS 2 (TWO) TIMES DAILY. (Patient not taking: Reported on 06/27/2018) 60 each 0  . methocarbamol (ROBAXIN) 750 MG tablet Take 1-2 tablets (750-1,500 mg total) by mouth 3 (three) times daily as needed for muscle spasms. (Patient not taking: Reported on 06/27/2018) 18 tablet 0  . metroNIDAZOLE (FLAGYL) 500 MG tablet Take 1 tablet (500 mg total) by mouth 2 (two) times daily. (Patient not taking: Reported on 05/09/2017) 14 tablet 0  . omeprazole (PRILOSEC) 20 MG capsule Take 1 capsule (20 mg total) by mouth daily before breakfast. (Patient not taking: Reported on 06/27/2018) 30 capsule 4  . predniSONE (DELTASONE) 10 MG tablet Take 6 tablets (60 mg total) by mouth daily. (Patient not taking: Reported on 06/27/2018) 30 tablet 0   No facility-administered medications prior to visit.     Allergies  Allergen Reactions  . Oxycodone Anaphylaxis, Shortness Of Breath and Swelling  . Watermelon Flavor Swelling and Other (See Comments)    Swelling of tongue and lips break out       Objective:    BP (!) 128/91 (BP Location: Right Arm, Patient Position: Sitting, Cuff Size: Normal)   Pulse 97   Temp 99.4 F (37.4 C) (Oral)   Ht '5\' 4"'  (1.626 m)   Wt 251 lb 12.8 oz (114.2 kg)   LMP 06/23/2018   SpO2 98%   BMI 43.22 kg/m  Wt Readings from Last 3 Encounters:  06/27/18 251 lb 12.8 oz (114.2 kg)  04/28/17 258 lb 6.4 oz (117.2 kg)  03/24/17 250 lb 3.2 oz (113.5 kg)    Physical Exam Vitals signs and nursing note reviewed.  Constitutional:      Appearance: She is well-developed.  HENT:     Head: Normocephalic and atraumatic.  Neck:     Musculoskeletal: Normal  range of motion.  Cardiovascular:     Rate and Rhythm: Normal rate and regular rhythm.     Heart sounds: Normal heart sounds. No murmur. No friction rub. No gallop.   Pulmonary:     Effort: Pulmonary effort is normal. No tachypnea or respiratory distress.     Breath sounds: Normal breath sounds. No decreased breath sounds, wheezing, rhonchi or rales.  Chest:     Chest wall: No tenderness.  Abdominal:     General: Bowel sounds are normal.     Palpations: Abdomen is soft.  Musculoskeletal: Normal range of motion.  Skin:    General: Skin is warm and dry.  Neurological:     Mental Status: She is alert and oriented to person, place, and time.     Coordination: Coordination normal.  Psychiatric:  Behavior: Behavior normal. Behavior is cooperative.        Thought Content: Thought content normal.        Judgment: Judgment normal.          Patient has been counseled extensively about nutrition and exercise as well as the importance of adherence with medications and regular follow-up. The patient was given clear instructions to go to ER or return to medical center if symptoms don't improve, worsen or new problems develop. The patient verbalized understanding.   Follow-up: Return in about 4 weeks (around 07/25/2018) for asthma and insomnia/tobacco dep.   Gildardo Pounds, FNP-BC [facility], [place]   07/01/2018, 9:34 PM

## 2018-06-28 LAB — CMP14+EGFR
ALBUMIN: 4.3 g/dL (ref 3.9–5.0)
ALK PHOS: 90 IU/L (ref 39–117)
ALT: 7 IU/L (ref 0–32)
AST: 12 IU/L (ref 0–40)
Albumin/Globulin Ratio: 1.6 (ref 1.2–2.2)
BUN / CREAT RATIO: 10 (ref 9–23)
BUN: 8 mg/dL (ref 6–20)
Bilirubin Total: <0.2 mg/dL (ref 0.0–1.2)
CO2: 22 mmol/L (ref 20–29)
CREATININE: 0.77 mg/dL (ref 0.57–1.00)
Calcium: 9.3 mg/dL (ref 8.7–10.2)
Chloride: 103 mmol/L (ref 96–106)
GFR calc Af Amer: 122 mL/min/{1.73_m2} (ref >59)
GFR calc non Af Amer: 106 mL/min/{1.73_m2} (ref >59)
GLUCOSE: 73 mg/dL (ref 65–99)
Globulin, Total: 2.7 g/dL (ref 1.5–4.5)
Potassium: 4.3 mmol/L (ref 3.5–5.2)
Sodium: 141 mmol/L (ref 134–144)
TOTAL PROTEIN: 7 g/dL (ref 6.0–8.5)

## 2018-06-28 LAB — CBC
HEMOGLOBIN: 12.4 g/dL (ref 11.1–15.9)
Hematocrit: 37.3 % (ref 34.0–46.6)
MCH: 31.4 pg (ref 26.6–33.0)
MCHC: 33.2 g/dL (ref 31.5–35.7)
MCV: 94 fL (ref 79–97)
Platelets: 341 10*3/uL (ref 150–450)
RBC: 3.95 x10E6/uL (ref 3.77–5.28)
RDW: 13 % (ref 11.7–15.4)
WBC: 10.7 10*3/uL (ref 3.4–10.8)

## 2018-07-01 ENCOUNTER — Encounter: Payer: Self-pay | Admitting: Nurse Practitioner

## 2018-07-04 ENCOUNTER — Telehealth: Payer: Self-pay

## 2018-07-04 NOTE — Telephone Encounter (Signed)
-----   Message from Claiborne Rigg, NP sent at 07/01/2018  9:38 PM EST ----- Labs are normal including kidney function and liver function. There is no anemia present.

## 2018-07-04 NOTE — Telephone Encounter (Signed)
CMA spoke to patient to inform on results.  Patient verified DOB. Pt. Understood.  

## 2018-07-25 ENCOUNTER — Ambulatory Visit: Payer: Self-pay | Admitting: Nurse Practitioner

## 2018-07-26 ENCOUNTER — Other Ambulatory Visit: Payer: Self-pay | Admitting: Nurse Practitioner

## 2018-07-26 DIAGNOSIS — J4520 Mild intermittent asthma, uncomplicated: Secondary | ICD-10-CM

## 2018-08-30 ENCOUNTER — Ambulatory Visit: Payer: PRIVATE HEALTH INSURANCE | Attending: Nurse Practitioner | Admitting: Nurse Practitioner

## 2018-08-30 ENCOUNTER — Encounter: Payer: Self-pay | Admitting: Nurse Practitioner

## 2018-08-30 ENCOUNTER — Other Ambulatory Visit: Payer: Self-pay

## 2018-08-30 DIAGNOSIS — F5101 Primary insomnia: Secondary | ICD-10-CM

## 2018-08-30 DIAGNOSIS — K219 Gastro-esophageal reflux disease without esophagitis: Secondary | ICD-10-CM

## 2018-08-30 DIAGNOSIS — J4520 Mild intermittent asthma, uncomplicated: Secondary | ICD-10-CM | POA: Diagnosis not present

## 2018-08-30 MED ORDER — OMEPRAZOLE 20 MG PO CPDR: 20.0000 mg | DELAYED_RELEASE_CAPSULE | Freq: Every day | ORAL | 4 refills | Status: DC

## 2018-08-30 MED ORDER — BUDESONIDE-FORMOTEROL FUMARATE 160-4.5 MCG/ACT IN AERO: 2.0000 | INHALATION_SPRAY | Freq: Two times a day (BID) | RESPIRATORY_TRACT | 3 refills | Status: DC

## 2018-08-30 MED ORDER — TRAZODONE HCL 100 MG PO TABS: 100.0000 mg | ORAL_TABLET | Freq: Every day | ORAL | 2 refills | Status: DC

## 2018-08-30 MED ORDER — CETIRIZINE HCL 10 MG PO TABS: 10.0000 mg | ORAL_TABLET | Freq: Every day | ORAL | 0 refills | Status: DC

## 2018-08-30 MED ORDER — MONTELUKAST SODIUM 10 MG PO TABS: 10.0000 mg | ORAL_TABLET | Freq: Every day | ORAL | 3 refills | Status: DC

## 2018-08-30 NOTE — Progress Notes (Signed)
Assessment & Plan:  Paula Wiggins was seen today for follow-up.  Diagnoses and all orders for this visit:  Mild intermittent asthma in adult without complication -     budesonide-formoterol (SYMBICORT) 160-4.5 MCG/ACT inhaler; Inhale 2 puffs into the lungs 2 (two) times daily.  Gastroesophageal reflux disease, esophagitis presence not specified -     omeprazole (PRILOSEC) 20 MG capsule; Take 1 capsule (20 mg total) by mouth daily before breakfast.  Primary insomnia -     traZODone (DESYREL) 100 MG tablet; Take 1 tablet (100 mg total) by mouth at bedtime.  Other orders -     montelukast (SINGULAIR) 10 MG tablet; Take 1 tablet (10 mg total) by mouth at bedtime. -     cetirizine (ZYRTEC) 10 MG tablet; Take 1 tablet (10 mg total) by mouth daily.    Patient has been counseled on age-appropriate routine health concerns for screening and prevention. These are reviewed and up-to-date. Referrals have been placed accordingly. Immunizations are up-to-date or declined.    Subjective:   Chief Complaint  Patient presents with   Follow-up    Pt. stated every hour in half her asthma continue to act up, patient is uncertain if it's the pollen, or weather. Pt. stated she still having trouble sleeping at night.    HPI Paula Wiggins 28 y.o. female presents for telehealth visit today with concerns of allergies and increased asthma symptoms.    Asthma Diagnosed with childhood asthma which she states has slighlty worsened as an adult. She is morbidly obese and a former smoker (for 1 month). She endorses current symptoms of wheezing and coughing after coming in from outside. She does not smoke nor is she around any smokers.  Associated symptoms include worsening nighttime nonproductive cough and wheezing.  Suspected precipitants include pollens.  Symptoms have been gradually worsening since their onset.   She has treated this current exacerbation with long-acting inhaled beta-adrenergic agonists and  short-acting inhaled beta-adrenergic agonists. The patient reports adherence to this regimen   Insomnia Currently taking trazodone 50 mg at night which she reports is not very effective in producing sound sleep.  States the trazodone makes her drowsy however she is unable to stay asleep.  Will increase trazodone from 50 to 100 mg.  GERD Chronic and symptoms well controlled with omeprazole 20 mg daily.  She denies bilious reflux, chest pain, choking on food, difficulty swallowing, hematemesis, melena, midespigastric pain and regurgitation of undigested food. She denies dysphagia.  She has not lost weight. She denies melena, hematochezia, hematemesis, and coffee ground emesis. Medical therapy in the past has included antacids, H2 antagonists and proton pump inhibitors.   Review of Systems  Constitutional: Negative for fever, malaise/fatigue and weight loss.  HENT: Negative.  Negative for nosebleeds.   Eyes: Negative.  Negative for blurred vision, double vision and photophobia.  Respiratory: Positive for cough, shortness of breath and wheezing.   Cardiovascular: Negative.  Negative for chest pain, palpitations and leg swelling.  Gastrointestinal: Positive for heartburn. Negative for nausea and vomiting.  Musculoskeletal: Negative.  Negative for myalgias.  Neurological: Negative.  Negative for dizziness, focal weakness, seizures and headaches.  Psychiatric/Behavioral: Negative for suicidal ideas. The patient has insomnia.     Past Medical History:  Diagnosis Date   Asthma     History reviewed. No pertinent surgical history.  Family History  Problem Relation Age of Onset   Hypertension Mother    Asthma Mother    Seizures Father    Hypertension Father  Congestive Heart Failure Paternal Grandfather    Asthma Sister    Emphysema Maternal Grandmother    Asthma Sister    Multiple sclerosis Paternal Aunt     Social History Reviewed with no changes to be made today.    Outpatient Medications Prior to Visit  Medication Sig Dispense Refill   albuterol (PROVENTIL) (2.5 MG/3ML) 0.083% nebulizer solution TAKE 3 MLS (2.5 MG TOTAL) BY NEBULIZATION EVERY 6 (SIX) HOURS AS NEEDED FOR WHEEZING OR SHORTNESS OF BREATH. 90 mL 2   VENTOLIN HFA 108 (90 Base) MCG/ACT inhaler INHALE 2 PUFFS INTO THE LUNGS EVERY 6 (SIX) HOURS AS NEEDED FOR WHEEZING OR SHORTNESS OF BREATH. 18 g 2   budesonide-formoterol (SYMBICORT) 160-4.5 MCG/ACT inhaler Inhale 2 puffs into the lungs 2 (two) times daily. 1 Inhaler 3   omeprazole (PRILOSEC) 20 MG capsule Take 1 capsule (20 mg total) by mouth daily before breakfast. 90 capsule 4   traZODone (DESYREL) 50 MG tablet Take 1 tablet (50 mg total) by mouth at bedtime. 90 tablet 2   fluticasone (FLONASE) 50 MCG/ACT nasal spray Place 2 sprays into both nostrils daily. (Patient not taking: Reported on 06/27/2018) 16 g 6   No facility-administered medications prior to visit.     Allergies  Allergen Reactions   Oxycodone Anaphylaxis, Shortness Of Breath and Swelling   Watermelon Flavor Swelling and Other (See Comments)    Swelling of tongue and lips break out       Objective:    There were no vitals taken for this visit. Wt Readings from Last 3 Encounters:  06/27/18 251 lb 12.8 oz (114.2 kg)  04/28/17 258 lb 6.4 oz (117.2 kg)  03/24/17 250 lb 3.2 oz (113.5 kg)          Patient has been counseled extensively about nutrition and exercise as well as the importance of adherence with medications and regular follow-up. The patient was given clear instructions to go to ER or return to medical center if symptoms don't improve, worsen or new problems develop. The patient verbalized understanding.   Follow-up: Return in about 1 week (around 09/06/2018) for asthma.   Claiborne RiggZelda W Cathalina Barcia, FNP-BC [facility] [place], [place] [phone]   08/30/2018, 3:44 PM

## 2018-09-05 ENCOUNTER — Ambulatory Visit: Payer: Self-pay | Admitting: Nurse Practitioner

## 2018-09-09 ENCOUNTER — Ambulatory Visit: Payer: PRIVATE HEALTH INSURANCE | Attending: Nurse Practitioner | Admitting: Nurse Practitioner

## 2018-09-09 ENCOUNTER — Other Ambulatory Visit: Payer: Self-pay

## 2018-09-09 ENCOUNTER — Encounter: Payer: Self-pay | Admitting: Nurse Practitioner

## 2018-09-09 DIAGNOSIS — J4540 Moderate persistent asthma, uncomplicated: Secondary | ICD-10-CM | POA: Diagnosis not present

## 2018-09-09 MED ORDER — ALBUTEROL SULFATE HFA 108 (90 BASE) MCG/ACT IN AERS: INHALATION_SPRAY | RESPIRATORY_TRACT | 2 refills | Status: DC

## 2018-09-09 MED ORDER — TIOTROPIUM BROMIDE MONOHYDRATE 1.25 MCG/ACT IN AERS: 2.0000 | INHALATION_SPRAY | Freq: Every day | RESPIRATORY_TRACT | 99 refills | Status: DC

## 2018-09-09 NOTE — Progress Notes (Signed)
Virtual Visit via Telephone Note  I connected with Paula PickettSavonya Wiggins on 09/09/18  at  10:10 AM EDT  EDT by telephone and verified that I am speaking with the correct person using two identifiers.   Consent I discussed the limitations, risks, security and privacy concerns of performing an evaluation and management service by telephone and the availability of in person appointments. I also discussed with the patient that there may be a patient responsible charge related to this service. The patient expressed understanding and agreed to proceed.   Location of Patient: Private  Residence   [facility]    Persons participating in Telemedicine visit: Bertram DenverZelda Stephaine Breshears FNP-BC YY [place] HeightsBien CMA [name] Nonie HoyerLegrande    History of Present Illness: Telemedicine visit for: Asthma follow up. Asthma Follow-up: She has previously been evaluated here for asthma and presents for an asthma follow-up; she is not currently in exacerbation but does state she does not feel any relief with using her symbicort. She is out of her ventolin inhaler. Symptoms currently include dyspnea, non-productive cough, wheezing and throat clearing and occur daily. Observed precipitants include exercise and pollens.  Current limitations in activity from asthma: none.  Number of days of school or work missed in the last month: 0. Number of Emergency Department visits in the previous month: none. Frequency of use of quick-relief meds: daily. The patient reports adherence to this regimen      Past Medical History:  Diagnosis Date  . Asthma     History reviewed. No pertinent surgical history.  Family History  Problem Relation Age of Onset  . Hypertension Mother   . Asthma Mother   . Seizures Father   . Hypertension Father   . Congestive Heart Failure Paternal Grandfather   . Asthma Sister   . Emphysema Maternal Grandmother   . Asthma Sister   . Multiple sclerosis Paternal Aunt     Social History   Socioeconomic History  . Marital status: Single    Spouse name: Not on file  . Number of children: Not on file  . Years of education: Not on file  . Highest education level: Not on file  Occupational History  . Not on file  Social Needs  . Financial resource strain: Not on file  . Food insecurity:    Worry: Not on file    Inability: Not on file  . Transportation needs:    Medical: Not on file    Non-medical: Not on file  Tobacco Use  . Smoking status: Former Smoker    Types: Cigarettes  . Smokeless tobacco: Never Used  . Tobacco comment: Smoked for 1 month total.   Substance and Sexual Activity  . Alcohol use: Not Currently  . Drug use: No  . Sexual activity: Yes    Comment: "with a woman"  Lifestyle  . Physical activity:    Days per week: Not on file    Minutes per session: Not on file  . Stress: Not on file  Relationships  . Social connections:    Talks on phone: Not on file    Gets together: Not on file    Attends religious service: Not on file    Active member of club or organization: Not on file    Attends meetings of clubs or organizations: Not on file    Relationship status: Not on file  Other Topics Concern  . Not on file  Social History Narrative   [facility] (02/13/17):  Patient denies any pets currently. Denies any bird exposure. No recent travel. No indoor plants. Does have carpet in her apartment. She reports there is mold around the vents in her bedroom. She has been sleeping in the living room with this mold visible.     Observations/Objective: Awake, alert and oriented x 3   Review of Systems  Constitutional: Negative for fever, malaise/fatigue and weight loss.  HENT: Positive for congestion. Negative for nosebleeds.   Eyes: Negative.  Negative for blurred vision, double vision and photophobia.  Respiratory: Positive for cough, shortness of breath and wheezing.   Cardiovascular: Negative.  Negative for chest pain,  palpitations and leg swelling.  Gastrointestinal: Negative.  Negative for heartburn, nausea and vomiting.  Musculoskeletal: Negative.  Negative for myalgias.  Neurological: Negative.  Negative for dizziness, focal weakness, seizures and headaches.  Psychiatric/Behavioral: Negative.  Negative for suicidal ideas.    Assessment and Plan: Diagnoses and all orders for this visit:  Moderate persistent allergic asthma -     albuterol (VENTOLIN HFA) 108 (90 Base) MCG/ACT inhaler; INHALE 2 PUFFS INTO THE LUNGS EVERY 6 (SIX) HOURS AS NEEDED FOR WHEEZING OR SHORTNESS OF BREATH. -     Tiotropium Bromide Monohydrate (SPIRIVA RESPIMAT) 1.25 MCG/ACT AERS; Inhale 2 puffs into the lungs daily for 30 days.     Follow Up Instructions Return in about 3 weeks (around 09/30/2018).     I discussed the assessment and treatment plan with the patient. The patient was provided an opportunity to ask questions and all were answered. The patient agreed with the plan and demonstrated an understanding of the instructions.   The patient was advised to call back or seek an in-person evaluation if the symptoms worsen or if the condition fails to improve as anticipated.  I provided 20 minutes of non-face-to-face time during this encounter including median intraservice time, reviewing previous notes, labs, imaging, medications and explaining diagnosis and management.  Claiborne Rigg, FNP-BC

## 2018-09-30 ENCOUNTER — Ambulatory Visit: Payer: PRIVATE HEALTH INSURANCE | Attending: Nurse Practitioner | Admitting: Nurse Practitioner

## 2018-09-30 ENCOUNTER — Encounter: Payer: Self-pay | Admitting: Nurse Practitioner

## 2018-09-30 ENCOUNTER — Other Ambulatory Visit: Payer: Self-pay

## 2018-09-30 DIAGNOSIS — J4520 Mild intermittent asthma, uncomplicated: Secondary | ICD-10-CM | POA: Diagnosis not present

## 2018-09-30 DIAGNOSIS — J4540 Moderate persistent asthma, uncomplicated: Secondary | ICD-10-CM

## 2018-09-30 NOTE — Progress Notes (Signed)
Virtual Visit via Telephone Note Due to national recommendations of social distancing due to COVID 19, telehealth visit is felt to be most appropriate for this patient at this time.  I discussed the limitations, risks, security and privacy concerns of performing an evaluation and management service by telephone and the availability of in person appointments. I also discussed with the patient that there may be a patient responsible charge related to this service. The patient expressed understanding and agreed to proceed.    I connected with Paula Wiggins on 09/30/18  at   9:50 AM EDT  EDT by telephone and verified that I am speaking with the correct person using two identifiers.   Consent I discussed the limitations, risks, security and privacy concerns of performing an evaluation and management service by telephone and the availability of in person appointments. I also discussed with the patient that there may be a patient responsible charge related to this service. The patient expressed understanding and agreed to proceed.   Location of Patient: Private Residence    [facility] Provider[facility]    Persons participating in Telemedicine visit: Bertram Denver FNP-BC YY [place] CMA Dody Legrande    History of Present Illness: Telemedicine visit for: Private Residence   Asthma She is morbidly obese BMI >40. Maintenance inhalers: symbicort and spiriva respimat. Reports spiriva ineffective. Also has singulair, SABA, nebs as well as antihistamine and steroid nasal spray. None of which are helpful.  She is a former smoker but denies any current tobacco use or close contact with any other smokers. Will refer to Pulmonology. Needs sleep study. She does have a past history of mold exposure as well.  Patient has been advised to apply for financial assistance and schedule to see our financial counselor.   Past Medical History:  Diagnosis Date  . Asthma      History reviewed. No pertinent surgical history.  Family History  Problem Relation Age of Onset  . Hypertension Mother   . Asthma Mother   . Seizures Father   . Hypertension Father   . Congestive Heart Failure Paternal Grandfather   . Asthma Sister   . Emphysema Maternal Grandmother   . Asthma Sister   . Multiple sclerosis Paternal Aunt     Social History   Socioeconomic History  . Marital status: Single    Spouse name: Not on file  . Number of children: Not on file  . Years of education: Not on file  . Highest education level: Not on file  Occupational History  . Not on file  Social Needs  . Financial resource strain: Not on file  . Food insecurity:    Worry: Not on file    Inability: Not on file  . Transportation needs:    Medical: Not on file    Non-medical: Not on file  Tobacco Use  . Smoking status: Former Smoker    Types: Cigarettes  . Smokeless tobacco: Never Used  . Tobacco comment: Smoked for 1 month total.   Substance and Sexual Activity  . Alcohol use: Not Currently  . Drug use: No  . Sexual activity: Yes    Comment: "with a woman"  Lifestyle  . Physical activity:    Days per week: Not on file    Minutes per session: Not on file  . Stress: Not on file  Relationships  . Social connections:    Talks on phone: Not on file    Gets together: Not on file  Attends religious service: Not on file    Active member of club or organization: Not on file    Attends meetings of clubs or organizations: Not on file    Relationship status: Not on file  Other Topics Concern  . Not on file  Social History Narrative   [facility] (02/13/17):   Patient denies any pets currently. Denies any bird exposure. No recent travel. No indoor plants. Does have carpet in her apartment. She reports there is mold around the vents in her bedroom. She has been sleeping in the living room with this mold visible.     Observations/Objective: Awake, alert and oriented x  3   Review of Systems  Constitutional: Negative for fever, malaise/fatigue and weight loss.  HENT: Negative.  Negative for nosebleeds.   Eyes: Negative.  Negative for blurred vision, double vision and photophobia.  Respiratory: Positive for cough and shortness of breath.   Cardiovascular: Negative.  Negative for chest pain, palpitations and leg swelling.  Gastrointestinal: Negative.  Negative for heartburn, nausea and vomiting.  Musculoskeletal: Negative.  Negative for myalgias.  Neurological: Negative.  Negative for dizziness, focal weakness, seizures and headaches.  Endo/Heme/Allergies: Positive for environmental allergies.  Psychiatric/Behavioral: Negative.  Negative for suicidal ideas.    Assessment and Plan: Paula BrooksSavonya was seen today for follow-up.  Diagnoses and all orders for this visit:  Mild intermittent asthma in adult without complication  Moderate persistent allergic asthma     Follow Up Instructions No follow-ups on file.     I discussed the assessment and treatment plan with the patient. The patient was provided an opportunity to ask questions and all were answered. The patient agreed with the plan and demonstrated an understanding of the instructions.   The patient was advised to call back or seek an in-person evaluation if the symptoms worsen or if the condition fails to improve as anticipated.  I provided 17 minutes of non-face-to-face time during this encounter including median intraservice time, reviewing previous notes, labs, imaging, medications and explaining diagnosis and management.  Claiborne RiggZelda W Camron Monday, FNP-BC

## 2018-10-03 ENCOUNTER — Encounter: Payer: Self-pay | Admitting: Nurse Practitioner

## 2018-10-03 ENCOUNTER — Other Ambulatory Visit: Payer: Self-pay

## 2018-10-03 ENCOUNTER — Ambulatory Visit: Payer: Self-pay | Attending: Nurse Practitioner | Admitting: Nurse Practitioner

## 2018-10-03 VITALS — BP 118/83 | HR 106 | Temp 99.2°F | Ht 64.0 in | Wt 253.6 lb

## 2018-10-03 DIAGNOSIS — Z8249 Family history of ischemic heart disease and other diseases of the circulatory system: Secondary | ICD-10-CM | POA: Insufficient documentation

## 2018-10-03 DIAGNOSIS — G4730 Sleep apnea, unspecified: Secondary | ICD-10-CM

## 2018-10-03 DIAGNOSIS — Z885 Allergy status to narcotic agent status: Secondary | ICD-10-CM | POA: Insufficient documentation

## 2018-10-03 DIAGNOSIS — Z79899 Other long term (current) drug therapy: Secondary | ICD-10-CM | POA: Insufficient documentation

## 2018-10-03 DIAGNOSIS — Z7951 Long term (current) use of inhaled steroids: Secondary | ICD-10-CM | POA: Insufficient documentation

## 2018-10-03 DIAGNOSIS — R0602 Shortness of breath: Secondary | ICD-10-CM | POA: Insufficient documentation

## 2018-10-03 DIAGNOSIS — Z6841 Body Mass Index (BMI) 40.0 and over, adult: Secondary | ICD-10-CM | POA: Insufficient documentation

## 2018-10-03 DIAGNOSIS — Z87891 Personal history of nicotine dependence: Secondary | ICD-10-CM | POA: Insufficient documentation

## 2018-10-03 DIAGNOSIS — R079 Chest pain, unspecified: Secondary | ICD-10-CM | POA: Insufficient documentation

## 2018-10-03 DIAGNOSIS — J4540 Moderate persistent asthma, uncomplicated: Secondary | ICD-10-CM

## 2018-10-03 DIAGNOSIS — K219 Gastro-esophageal reflux disease without esophagitis: Secondary | ICD-10-CM | POA: Insufficient documentation

## 2018-10-03 DIAGNOSIS — J4520 Mild intermittent asthma, uncomplicated: Secondary | ICD-10-CM | POA: Insufficient documentation

## 2018-10-03 MED ORDER — ALBUTEROL SULFATE HFA 108 (90 BASE) MCG/ACT IN AERS: INHALATION_SPRAY | RESPIRATORY_TRACT | 2 refills | Status: DC

## 2018-10-03 MED ORDER — BUDESONIDE-FORMOTEROL FUMARATE 160-4.5 MCG/ACT IN AERO: 2.0000 | INHALATION_SPRAY | Freq: Two times a day (BID) | RESPIRATORY_TRACT | 3 refills | Status: DC

## 2018-10-03 MED ORDER — PANTOPRAZOLE SODIUM 20 MG PO TBEC: 20.0000 mg | DELAYED_RELEASE_TABLET | Freq: Two times a day (BID) | ORAL | 1 refills | Status: DC

## 2018-10-03 MED ORDER — MONTELUKAST SODIUM 10 MG PO TABS: 10.0000 mg | ORAL_TABLET | Freq: Every day | ORAL | 3 refills | Status: DC

## 2018-10-03 NOTE — Progress Notes (Signed)
Subjective:    Patient ID: Paula Wiggins, female    DOB: February 09, 1991, 28 y.o.   MRN: [id number]  This is a 28 year old female with severe persistent asthma.  She has significant mold exposure from up an apartment that she was living in which she finally moved out of the year ago.  She is now living in a better environment with her mother in a house.  She still has persistent symptoms of asthma and is referred for further evaluation by her primary care provider.  Note she had multiple emergency room and hospital visits in 2018 but since that time has not required emergency room or hospitalization  The patient does have nocturnal sleeping disturbances and snoring and does have a sleep study that is pending.  She states reflux is a major issue and she has high-level acid into the throat area with coughing and choking.  Her flares now on a daily basis with both reflux and the asthma  Please review asthma assessment below  Asthma  She complains of chest tightness, cough, difficulty breathing, frequent throat clearing, hemoptysis, hoarse voice, shortness of breath, sputum production and wheezing. Primary symptoms comments: Dizziness, cough is prod occ mucus green mucus Hemoptysis: mixed with mucus Chest pain is anterior substernal and is stabbing pain . This is a chronic problem. The current episode started more than 1 year ago. The problem occurs constantly. The problem has been gradually worsening. The cough is productive of purulent sputum. Associated symptoms include chest pain, dyspnea on exertion, headaches, heartburn, nasal congestion, orthopnea, PND, postnasal drip, sneezing, a sore throat and trouble swallowing. Pertinent negatives include no ear congestion, ear pain, fever or rhinorrhea. Associated symptoms comments: GERD up into the throat area. Notes mild sinus pressure and HA. Her symptoms are aggravated by change in weather, exposure to fumes, exposure to smoke, lying down, strenuous  activity, URI, pollen, emotional stress, animal exposure and any activity. Her symptoms are alleviated by beta-agonist and steroid inhaler. She reports minimal improvement on treatment. Her past medical history is significant for asthma. There is no history of pneumonia.    Past Medical History:  Diagnosis Date  . Asthma      Family History  Problem Relation Age of Onset  . Hypertension Mother   . Asthma Mother   . Seizures Father   . Hypertension Father   . Congestive Heart Failure Paternal Grandfather   . Asthma Sister   . Emphysema Maternal Grandmother   . Asthma Sister   . Multiple sclerosis Paternal Aunt      Social History   Socioeconomic History  . Marital status: Single    Spouse name: Not on file  . Number of children: Not on file  . Years of education: Not on file  . Highest education level: Not on file  Occupational History  . Not on file  Social Needs  . Financial resource strain: Not on file  . Food insecurity:    Worry: Not on file    Inability: Not on file  . Transportation needs:    Medical: Not on file    Non-medical: Not on file  Tobacco Use  . Smoking status: Former Smoker    Types: Cigarettes  . Smokeless tobacco: Never Used  . Tobacco comment: Smoked for 1 month total.   Substance and Sexual Activity  . Alcohol use: Not Currently  . Drug use: No  . Sexual activity: Yes    Comment: "with a woman"  Lifestyle  . Physical activity:  Days per week: Not on file    Minutes per session: Not on file  . Stress: Not on file  Relationships  . Social connections:    Talks on phone: Not on file    Gets together: Not on file    Attends religious service: Not on file    Active member of club or organization: Not on file    Attends meetings of clubs or organizations: Not on file    Relationship status: Not on file  . Intimate partner violence:    Fear of current or ex partner: Not on file    Emotionally abused: Not on file    Physically abused:  Not on file    Forced sexual activity: Not on file  Other Topics Concern  . Not on file  Social History Narrative   [facility] (02/13/17):   Patient denies any pets currently. Denies any bird exposure. No recent travel. No indoor plants. Does have carpet in her apartment. She reports there is mold around the vents in her bedroom. She has been sleeping in the living room with this mold visible.     Allergies  Allergen Reactions  . Oxycodone Anaphylaxis, Shortness Of Breath and Swelling  . Watermelon Flavor Swelling and Other (See Comments)    Swelling of tongue and lips break out     Outpatient Medications Prior to Visit  Medication Sig Dispense Refill  . albuterol (PROVENTIL) (2.5 MG/3ML) 0.083% nebulizer solution TAKE 3 MLS (2.5 MG TOTAL) BY NEBULIZATION EVERY 6 (SIX) HOURS AS NEEDED FOR WHEEZING OR SHORTNESS OF BREATH. 90 mL 2  . albuterol (VENTOLIN HFA) 108 (90 Base) MCG/ACT inhaler INHALE 2 PUFFS INTO THE LUNGS EVERY 6 (SIX) HOURS AS NEEDED FOR WHEEZING OR SHORTNESS OF BREATH. 18 g 2  . budesonide-formoterol (SYMBICORT) 160-4.5 MCG/ACT inhaler Inhale 2 puffs into the lungs 2 (two) times daily. 1 Inhaler 3  . montelukast (SINGULAIR) 10 MG tablet Take 1 tablet (10 mg total) by mouth at bedtime. 90 tablet 3  . traZODone (DESYREL) 100 MG tablet Take 1 tablet (100 mg total) by mouth at bedtime. 90 tablet 2  . pantoprazole (PROTONIX) 20 MG tablet Take 1 tablet (20 mg total) by mouth 2 (two) times daily before a meal for 30 days. 60 tablet 1  . cetirizine (ZYRTEC) 10 MG tablet Take 1 tablet (10 mg total) by mouth daily. (Patient not taking: Reported on 10/04/2018) 90 tablet 0  . fluticasone (FLONASE) 50 MCG/ACT nasal spray Place 2 sprays into both nostrils daily. (Patient not taking: Reported on 10/04/2018) 16 g 6  . Tiotropium Bromide Monohydrate (SPIRIVA RESPIMAT) 1.25 MCG/ACT AERS Inhale 2 puffs into the lungs daily for 30 days. (Patient not taking: Reported on 10/04/2018) 4 g prn   No  facility-administered medications prior to visit.      Review of Systems  Constitutional: Negative for fever.  HENT: Positive for hoarse voice, postnasal drip, sneezing, sore throat and trouble swallowing. Negative for ear pain and rhinorrhea.   Respiratory: Positive for cough, hemoptysis, sputum production, shortness of breath and wheezing.   Cardiovascular: Positive for chest pain, dyspnea on exertion and PND.  Gastrointestinal: Positive for heartburn.  Neurological: Positive for headaches.       Objective:   Physical Exam Vitals:   10/04/18 1023  BP: 122/78  Pulse: 99  Temp: 98 F (36.7 C)  TempSrc: Oral  SpO2: 96%  Weight: 253 lb (114.8 kg)  Height:  (1.626 m)    Gen: Pleasant,  well-nourished, in no distress,  normal affect  ENT: No lesions,  mouth clear,  oropharynx clear, +2 postnasal drip, nasal turbinate edema  Neck: No JVD, no TMG, no carotid bruits  Lungs: No use of accessory muscles, no dullness to percussion, expired wheezes poor airflow   cardiovascular: RRR, heart sounds normal, no murmur or gallops, no peripheral edema  Abdomen: soft epigastric tenderness, no HSM,  BS normal  Musculoskeletal: No deformities, no cyanosis or clubbing  Neuro: alert, non focal  Skin: Warm, no lesions or rashes No spirometry found in the system      Assessment & Plan:  I personally reviewed [facility] as well as any outside material available during this office visit and agree with the  radiology impressions.   Asthma, severe persistent Severe persistent asthma with significant atopic features and also being precipitated by reflux disease at a high level  I reviewed the patient's inhaler technique and it seemed to be quite good  Plan will be to repulse prednisone with a slow taper and to continue Symbicort at 2 inhalations twice daily along with as needed albuterol she will also resume Flonase 2 sprays each nostril daily and Zyrtec  daily  Reflux needs to be more aggressively controlled by increasing Protonix to 40 mg twice daily antireflux diet was again reviewed with the patient  We will also continue the Singulair at 10 mg daily  It appears the patient has been removed from the mold environment  We will check a pulmonary function study  GERD (gastroesophageal reflux disease) Plan to increase Protonix to 40 mg twice daily   Diagnoses and all orders for this visit:  Severe persistent asthma in adult without complication -     Pulmonary Function Test; Future  Gastroesophageal reflux disease, esophagitis presence not specified -     pantoprazole (PROTONIX) 40 MG tablet; Take 1 tablet (40 mg total) by mouth 2 (two) times daily before a meal.  Severe persistent asthma with acute exacerbation  Other orders -     fluticasone (FLONASE) 50 MCG/ACT nasal spray; Place 2 sprays into both nostrils daily. -     cetirizine (ZYRTEC) 10 MG tablet; Take 1 tablet (10 mg total) by mouth daily. -     predniSONE (DELTASONE) 10 MG tablet; Take 4 tablets daily for 5 days then stop

## 2018-10-03 NOTE — Progress Notes (Signed)
Assessment & Plan:  Paula Wiggins was seen today for chest pain.  Diagnoses and all orders for this visit:  Gastroesophageal reflux disease, esophagitis presence not specified -     pantoprazole (PROTONIX) 20 MG tablet; Take 1 tablet (20 mg total) by mouth 2 (two) times daily before a meal for 30 days. INSTRUCTIONS: Avoid GERD Triggers: acidic, spicy or fried foods, caffeine, coffee, sodas,  alcohol and chocolate.   Mild intermittent asthma in adult without complication -     budesonide-formoterol (SYMBICORT) 160-4.5 MCG/ACT inhaler; Inhale 2 puffs into the lungs 2 (two) times daily. -     montelukast (SINGULAIR) 10 MG tablet; Take 1 tablet (10 mg total) by mouth at bedtime.  Moderate persistent allergic asthma -     albuterol (VENTOLIN HFA) 108 (90 Base) MCG/ACT inhaler; INHALE 2 PUFFS INTO THE LUNGS EVERY 6 (SIX) HOURS AS NEEDED FOR WHEEZING OR SHORTNESS OF BREATH. -     montelukast (SINGULAIR) 10 MG tablet; Take 1 tablet (10 mg total) by mouth at bedtime.  Shortness of breath -     Split night study; Future  Encounter for counseling for sleep apnea -     Split night study; Future    Patient has been counseled on age-appropriate routine health concerns for screening and prevention. These are reviewed and up-to-date. Referrals have been placed accordingly. Immunizations are up-to-date or declined.    Subjective:   Chief Complaint  Patient presents with   Chest Pain    Pt. stated she is having chest pain and also heart burn.    HPI Paula Wiggins 28 y.o. female presents to office today for follow up to poorly controlled GERD and chest pain. EKG performed today and WNL. She also has a history of poorly controlled asthma despite endorsing medication compliance. She is a former smoker, Morbidly obese (BMI 43.53), sedentary and current medications include symbicort, spiriva, singulair,zyrtec, flonase, ventolin IH  and proventil nebs prn. She has an appointment with Pulmonologist Dr.  Delford Field tomorrow.    GERD: Patient complains of heartburn and epigastric chest pain. This has been associated with bilious reflux, chest pain, fullness after meals, heartburn, midespigastric pain, shortness of breath and symptoms primarily relate to meals, and lying down after meals.  She denies deep pressure at base of neck, hematemesis, hoarseness, melena, nausea, unexpected weight loss and wheezing. She denies dysphagia.   Medical therapy in the past has included omeprazole  (she increased to 20 mg BID with no relief of symptoms.) She has been working hard to eliminate GERD trigger foods and drinks from her diet.     Review of Systems  Constitutional: Negative for fever, malaise/fatigue and weight loss.  HENT: Negative.  Negative for nosebleeds.   Eyes: Negative.  Negative for blurred vision, double vision and photophobia.  Respiratory: Positive for cough and shortness of breath. Negative for hemoptysis, sputum production and wheezing.   Cardiovascular: Positive for chest pain. Negative for palpitations and leg swelling.  Gastrointestinal: Positive for heartburn. Negative for abdominal pain, blood in stool, constipation, diarrhea, melena, nausea and vomiting.  Musculoskeletal: Negative.  Negative for myalgias.  Neurological: Negative.  Negative for dizziness, focal weakness, seizures and headaches.  Endo/Heme/Allergies: Positive for environmental allergies.  Psychiatric/Behavioral: Negative.  Negative for suicidal ideas.    Past Medical History:  Diagnosis Date   Asthma     History reviewed. No pertinent surgical history.  Family History  Problem Relation Age of Onset   Hypertension Mother    Asthma Mother  Seizures Father    Hypertension Father    Congestive Heart Failure Paternal Grandfather    Asthma Sister    Emphysema Maternal Grandmother    Asthma Sister    Multiple sclerosis Paternal Aunt     Social History Reviewed with no changes to be made today.    Outpatient Medications Prior to Visit  Medication Sig Dispense Refill   albuterol (PROVENTIL) (2.5 MG/3ML) 0.083% nebulizer solution TAKE 3 MLS (2.5 MG TOTAL) BY NEBULIZATION EVERY 6 (SIX) HOURS AS NEEDED FOR WHEEZING OR SHORTNESS OF BREATH. 90 mL 2   cetirizine (ZYRTEC) 10 MG tablet Take 1 tablet (10 mg total) by mouth daily. 90 tablet 0   fluticasone (FLONASE) 50 MCG/ACT nasal spray Place 2 sprays into both nostrils daily. 16 g 6   Tiotropium Bromide Monohydrate (SPIRIVA RESPIMAT) 1.25 MCG/ACT AERS Inhale 2 puffs into the lungs daily for 30 days. 4 g prn   traZODone (DESYREL) 100 MG tablet Take 1 tablet (100 mg total) by mouth at bedtime. 90 tablet 2   albuterol (VENTOLIN HFA) 108 (90 Base) MCG/ACT inhaler INHALE 2 PUFFS INTO THE LUNGS EVERY 6 (SIX) HOURS AS NEEDED FOR WHEEZING OR SHORTNESS OF BREATH. 18 g 2   budesonide-formoterol (SYMBICORT) 160-4.5 MCG/ACT inhaler Inhale 2 puffs into the lungs 2 (two) times daily. 1 Inhaler 3   montelukast (SINGULAIR) 10 MG tablet Take 1 tablet (10 mg total) by mouth at bedtime. 90 tablet 3   omeprazole (PRILOSEC) 20 MG capsule Take 1 capsule (20 mg total) by mouth daily before breakfast. 90 capsule 4   No facility-administered medications prior to visit.     Allergies  Allergen Reactions   Oxycodone Anaphylaxis, Shortness Of Breath and Swelling   Watermelon Flavor Swelling and Other (See Comments)    Swelling of tongue and lips break out       Objective:    BP 118/83 (BP Location: Right Arm, Patient Position: Sitting, Cuff Size: Normal)    Pulse (!) 106    Temp 99.2 F (37.3 C) (Oral)    Ht 5\' 4"  (1.626 m)    Wt 253 lb 9.6 oz (115 kg)    SpO2 99%    BMI 43.53 kg/m  Wt Readings from Last 3 Encounters:  10/03/18 253 lb 9.6 oz (115 kg)  06/27/18 251 lb 12.8 oz (114.2 kg)  04/28/17 258 lb 6.4 oz (117.2 kg)    Physical Exam Vitals signs and nursing note reviewed.  Constitutional:      Appearance: She is well-developed.  HENT:      Head: Normocephalic and atraumatic.  Neck:     Musculoskeletal: Normal range of motion.  Cardiovascular:     Rate and Rhythm: Regular rhythm. Tachycardia present.     Heart sounds: Normal heart sounds. No murmur. No friction rub. No gallop.   Pulmonary:     Effort: Pulmonary effort is normal. No tachypnea or respiratory distress.     Breath sounds: Normal breath sounds. No decreased breath sounds, wheezing, rhonchi or rales.  Chest:     Chest wall: No tenderness.  Abdominal:     General: Bowel sounds are normal.     Palpations: Abdomen is soft.  Musculoskeletal: Normal range of motion.  Skin:    General: Skin is warm and dry.  Neurological:     Mental Status: She is alert and oriented to person, place, and time.     Coordination: Coordination normal.  Psychiatric:        Behavior: Behavior normal.  Behavior is cooperative.        Thought Content: Thought content normal.        Judgment: Judgment normal.          Patient has been counseled extensively about nutrition and exercise as well as the importance of adherence with medications and regular follow-up. The patient was given clear instructions to go to ER or return to medical center if symptoms don't improve, worsen or new problems develop. The patient verbalized understanding.   Follow-up: Return in about 3 months (around 01/03/2019).   Claiborne Rigg, FNP-BC [facility] and [facility] [place], [place] [phone]   10/03/2018, 10:53 AM

## 2018-10-03 NOTE — Progress Notes (Signed)
   09/30/18 1050  Visit Method  Type of Visit Not In-person

## 2018-10-04 ENCOUNTER — Ambulatory Visit: Payer: Self-pay | Attending: Critical Care Medicine | Admitting: Critical Care Medicine

## 2018-10-04 ENCOUNTER — Encounter: Payer: Self-pay | Admitting: Critical Care Medicine

## 2018-10-04 ENCOUNTER — Other Ambulatory Visit: Payer: Self-pay

## 2018-10-04 VITALS — BP 122/78 | HR 99 | Temp 98.0°F | Ht 64.0 in | Wt 253.0 lb

## 2018-10-04 DIAGNOSIS — K219 Gastro-esophageal reflux disease without esophagitis: Secondary | ICD-10-CM

## 2018-10-04 DIAGNOSIS — J4551 Severe persistent asthma with (acute) exacerbation: Secondary | ICD-10-CM

## 2018-10-04 DIAGNOSIS — J4550 Severe persistent asthma, uncomplicated: Secondary | ICD-10-CM

## 2018-10-04 MED ORDER — FLUTICASONE PROPIONATE 50 MCG/ACT NA SUSP: 2.0000 | Freq: Every day | NASAL | 6 refills | Status: DC

## 2018-10-04 MED ORDER — PANTOPRAZOLE SODIUM 40 MG PO TBEC: 40.0000 mg | DELAYED_RELEASE_TABLET | Freq: Two times a day (BID) | ORAL | 1 refills | Status: DC

## 2018-10-04 MED ORDER — PREDNISONE 10 MG PO TABS: ORAL_TABLET | ORAL | 0 refills | Status: DC

## 2018-10-04 MED ORDER — CETIRIZINE HCL 10 MG PO TABS: 10.0000 mg | ORAL_TABLET | Freq: Every day | ORAL | 2 refills | Status: DC

## 2018-10-04 NOTE — Patient Instructions (Signed)
Start prednisone take 4 a day for 5 days until gone  Continue Symbicort 2 inhalations twice a day and albuterol as needed  Resume Flonase 2 sprays each nostril daily and Zyrtec 10 mg daily  Increase Protonix to 40 mg twice daily  The Protonix, prednisone, Flonase, Zyrtec were all sent to the pharmacy for you to pick up today  A lung function test will be scheduled  Please follow a strict reflux diet as noted below  Return to see Dr. Delford Field in 3 weeks

## 2018-10-04 NOTE — Assessment & Plan Note (Signed)
Plan to increase Protonix to 40 mg twice daily

## 2018-10-04 NOTE — Progress Notes (Signed)
Per pt all of her inhalers do not work anymore.

## 2018-10-04 NOTE — Assessment & Plan Note (Signed)
Severe persistent asthma with significant atopic features and also being precipitated by reflux disease at a high level  I reviewed the patient's inhaler technique and it seemed to be quite good  Plan will be to repulse prednisone with a slow taper and to continue Symbicort at 2 inhalations twice daily along with as needed albuterol she will also resume Flonase 2 sprays each nostril daily and Zyrtec daily  Reflux needs to be more aggressively controlled by increasing Protonix to 40 mg twice daily antireflux diet was again reviewed with the patient  We will also continue the Singulair at 10 mg daily  It appears the patient has been removed from the mold environment  We will check a pulmonary function study

## 2018-10-25 ENCOUNTER — Ambulatory Visit: Payer: PRIVATE HEALTH INSURANCE | Admitting: Critical Care Medicine

## 2018-10-25 NOTE — Progress Notes (Deleted)
Subjective:    Patient ID: Paula Wiggins, female    DOB: 1991-03-15, 28 y.o.   MRN: [id number]  This is a 28 year old female with severe persistent asthma.  She has significant mold exposure from up an apartment that she was living in which she finally moved out of the year ago.  She is now living in a better environment with her mother in a house.  She still has persistent symptoms of asthma and is referred for further evaluation by her primary care provider.  Note she had multiple emergency room and hospital visits in 2018 but since that time has not required emergency room or hospitalization  The patient does have nocturnal sleeping disturbances and snoring and does have a sleep study that is pending.  She states reflux is a major issue and she has high-level acid into the throat area with coughing and choking.  Her flares now on a daily basis with both reflux and the asthma  Please review asthma assessment below  At 5/19 OV we recommended Asthma, severe persistent Severe persistent asthma with significant atopic features and also being precipitated by reflux disease at a high level  I reviewed the patient's inhaler technique and it seemed to be quite good  Plan will be to repulse prednisone with a slow taper and to continue Symbicort at 2 inhalations twice daily along with as needed albuterol she will also resume Flonase 2 sprays each nostril daily and Zyrtec daily  Reflux needs to be more aggressively controlled by increasing Protonix to 40 mg twice daily antireflux diet was again reviewed with the patient  We will also continue the Singulair at 10 mg daily  It appears the patient has been removed from the mold environment  We will check a pulmonary function study  GERD (gastroesophageal reflux disease) Plan to increase Protonix to 40 mg twice daily   Asthma  She complains of chest tightness, cough, difficulty breathing, frequent throat clearing, hemoptysis, hoarse  voice, shortness of breath, sputum production and wheezing. Primary symptoms comments: Dizziness, cough is prod occ mucus green mucus Hemoptysis: mixed with mucus Chest pain is anterior substernal and is stabbing pain . This is a chronic problem. The current episode started more than 1 year ago. The problem occurs constantly. The problem has been gradually worsening. The cough is productive of purulent sputum. Associated symptoms include chest pain, dyspnea on exertion, headaches, heartburn, nasal congestion, orthopnea, PND, postnasal drip, sneezing, a sore throat and trouble swallowing. Pertinent negatives include no ear congestion, ear pain, fever or rhinorrhea. Associated symptoms comments: GERD up into the throat area. Notes mild sinus pressure and HA. Her symptoms are aggravated by change in weather, exposure to fumes, exposure to smoke, lying down, strenuous activity, URI, pollen, emotional stress, animal exposure and any activity. Her symptoms are alleviated by beta-agonist and steroid inhaler. She reports minimal improvement on treatment. Her past medical history is significant for asthma. There is no history of pneumonia.    Past Medical History:  Diagnosis Date  . Asthma      Family History  Problem Relation Age of Onset  . Hypertension Mother   . Asthma Mother   . Seizures Father   . Hypertension Father   . Congestive Heart Failure Paternal Grandfather   . Asthma Sister   . Emphysema Maternal Grandmother   . Asthma Sister   . Multiple sclerosis Paternal Aunt      Social History   Socioeconomic History  . Marital status: Single  Spouse name: Not on file  . Number of children: Not on file  . Years of education: Not on file  . Highest education level: Not on file  Occupational History  . Not on file  Social Needs  . Financial resource strain: Not on file  . Food insecurity:    Worry: Not on file    Inability: Not on file  . Transportation needs:    Medical: Not on  file    Non-medical: Not on file  Tobacco Use  . Smoking status: Former Smoker    Types: Cigarettes  . Smokeless tobacco: Never Used  . Tobacco comment: Smoked for 1 month total.   Substance and Sexual Activity  . Alcohol use: Not Currently  . Drug use: No  . Sexual activity: Yes    Comment: "with a woman"  Lifestyle  . Physical activity:    Days per week: Not on file    Minutes per session: Not on file  . Stress: Not on file  Relationships  . Social connections:    Talks on phone: Not on file    Gets together: Not on file    Attends religious service: Not on file    Active member of club or organization: Not on file    Attends meetings of clubs or organizations: Not on file    Relationship status: Not on file  . Intimate partner violence:    Fear of current or ex partner: Not on file    Emotionally abused: Not on file    Physically abused: Not on file    Forced sexual activity: Not on file  Other Topics Concern  . Not on file  Social History Narrative   [facility] (02/13/17):   Patient denies any pets currently. Denies any bird exposure. No recent travel. No indoor plants. Does have carpet in her apartment. She reports there is mold around the vents in her bedroom. She has been sleeping in the living room with this mold visible.     Allergies  Allergen Reactions  . Oxycodone Anaphylaxis, Shortness Of Breath and Swelling  . Watermelon Flavor Swelling and Other (See Comments)    Swelling of tongue and lips break out     Outpatient Medications Prior to Visit  Medication Sig Dispense Refill  . albuterol (PROVENTIL) (2.5 MG/3ML) 0.083% nebulizer solution TAKE 3 MLS (2.5 MG TOTAL) BY NEBULIZATION EVERY 6 (SIX) HOURS AS NEEDED FOR WHEEZING OR SHORTNESS OF BREATH. 90 mL 2  . albuterol (VENTOLIN HFA) 108 (90 Base) MCG/ACT inhaler INHALE 2 PUFFS INTO THE LUNGS EVERY 6 (SIX) HOURS AS NEEDED FOR WHEEZING OR SHORTNESS OF BREATH. 18 g 2  . budesonide-formoterol (SYMBICORT)  160-4.5 MCG/ACT inhaler Inhale 2 puffs into the lungs 2 (two) times daily. 1 Inhaler 3  . cetirizine (ZYRTEC) 10 MG tablet Take 1 tablet (10 mg total) by mouth daily. 30 tablet 2  . fluticasone (FLONASE) 50 MCG/ACT nasal spray Place 2 sprays into both nostrils daily. 16 g 6  . montelukast (SINGULAIR) 10 MG tablet Take 1 tablet (10 mg total) by mouth at bedtime. 90 tablet 3  . pantoprazole (PROTONIX) 40 MG tablet Take 1 tablet (40 mg total) by mouth 2 (two) times daily before a meal. 60 tablet 1  . predniSONE (DELTASONE) 10 MG tablet Take 4 tablets daily for 5 days then stop 20 tablet 0  . traZODone (DESYREL) 100 MG tablet Take 1 tablet (100 mg total) by mouth at bedtime. 90 tablet 2  No facility-administered medications prior to visit.      Review of Systems  Constitutional: Negative for fever.  HENT: Positive for hoarse voice, postnasal drip, sneezing, sore throat and trouble swallowing. Negative for ear pain and rhinorrhea.   Respiratory: Positive for cough, hemoptysis, sputum production, shortness of breath and wheezing.   Cardiovascular: Positive for chest pain, dyspnea on exertion and PND.  Gastrointestinal: Positive for heartburn.  Neurological: Positive for headaches.       Objective:   Physical Exam There were no vitals filed for this visit.  Gen: Pleasant, well-nourished, in no distress,  normal affect  ENT: No lesions,  mouth clear,  oropharynx clear, +2 postnasal drip, nasal turbinate edema  Neck: No JVD, no TMG, no carotid bruits  Lungs: No use of accessory muscles, no dullness to percussion, expired wheezes poor airflow   cardiovascular: RRR, heart sounds normal, no murmur or gallops, no peripheral edema  Abdomen: soft epigastric tenderness, no HSM,  BS normal  Musculoskeletal: No deformities, no cyanosis or clubbing  Neuro: alert, non focal  Skin: Warm, no lesions or rashes No spirometry found in the system      Assessment & Plan:  I personally reviewed  all images and lab [facility] as well as any outside material available during this office visit and agree with the  radiology impressions.   No problem-specific Assessment & Plan notes found for this encounter.   There are no diagnoses linked to this encounter.

## 2018-11-21 ENCOUNTER — Other Ambulatory Visit (HOSPITAL_COMMUNITY)
Admission: RE | Admit: 2018-11-21 | Discharge: 2018-11-21 | Disposition: A | Discharge status: Home / Self Care | Payer: PRIVATE HEALTH INSURANCE | Source: Ambulatory Visit | Attending: Internal Medicine | Admitting: Internal Medicine

## 2018-11-21 DIAGNOSIS — Z01812 Encounter for preprocedural laboratory examination: Secondary | ICD-10-CM | POA: Diagnosis present

## 2018-11-21 DIAGNOSIS — Z1159 Encounter for screening for other viral diseases: Secondary | ICD-10-CM | POA: Diagnosis not present

## 2018-11-22 LAB — SARS CORONAVIRUS 2 (TAT 6-24 HRS): SARS Coronavirus 2: NEGATIVE

## 2018-11-24 ENCOUNTER — Ambulatory Visit (HOSPITAL_BASED_OUTPATIENT_CLINIC_OR_DEPARTMENT_OTHER): Payer: PRIVATE HEALTH INSURANCE

## 2018-11-26 ENCOUNTER — Other Ambulatory Visit: Payer: Self-pay

## 2018-11-26 ENCOUNTER — Ambulatory Visit (HOSPITAL_BASED_OUTPATIENT_CLINIC_OR_DEPARTMENT_OTHER): Payer: PRIVATE HEALTH INSURANCE | Attending: Nurse Practitioner | Admitting: Internal Medicine

## 2018-11-26 DIAGNOSIS — R0683 Snoring: Secondary | ICD-10-CM | POA: Insufficient documentation

## 2018-11-26 DIAGNOSIS — G4730 Sleep apnea, unspecified: Secondary | ICD-10-CM

## 2018-11-26 DIAGNOSIS — R0602 Shortness of breath: Secondary | ICD-10-CM | POA: Insufficient documentation

## 2018-11-26 DIAGNOSIS — Z7189 Other specified counseling: Secondary | ICD-10-CM

## 2018-11-28 ENCOUNTER — Other Ambulatory Visit: Payer: Self-pay

## 2018-12-03 DIAGNOSIS — R0602 Shortness of breath: Secondary | ICD-10-CM

## 2018-12-03 NOTE — Procedures (Signed)
    Patient Name: Paula Wiggins, Rief Date: 11/26/2018 Gender: Female D.O.B: 1990/07/14 Age (years): 28 Referring Provider: Gildardo Pounds NP Height (inches): 39 Interpreting Physician: Baird Lyons MD, ABSM Weight (lbs): 253 RPSGT: Lanae Boast BMI: 45 MRN: [id number] Neck Size: 15.00  CLINICAL INFORMATION Sleep Study Type: NPSG Indication for sleep study: Snoring, Witnessed Apneas Epworth Sleepiness Score: 1  SLEEP STUDY TECHNIQUE As per the AASM Manual for the Scoring of Sleep and Associated Events v2.3 (April 2016) with a hypopnea requiring 4% desaturations.  The channels recorded and monitored were frontal, central and occipital EEG, electrooculogram (EOG), submentalis EMG (chin), nasal and oral airflow, thoracic and abdominal wall motion, anterior tibialis EMG, snore microphone, electrocardiogram, and pulse oximetry.  MEDICATIONS Medications self-administered by patient taken the night of the study : none reported  SLEEP ARCHITECTURE The study was initiated at 11:11:04 PM and ended at 5:33:01 AM.  Sleep onset time was 17.6 minutes and the sleep efficiency was 61.8%. The total sleep time was 236 minutes.  Stage REM latency was 134.5 minutes.  The patient spent 39.19% of the night in stage N1 sleep, 33.26% in stage N2 sleep, 18.01% in stage N3 and 9.5% in REM.  Alpha intrusion was absent.  Supine sleep was 42.16%.  RESPIRATORY PARAMETERS The overall apnea/hypopnea index (AHI) was 2.8 per hour. There were 3 total apneas, including 1 obstructive, 2 central and 0 mixed apneas. There were 8 hypopneas and 20 RERAs.  The AHI during Stage REM sleep was 2.7 per hour.  AHI while supine was 5.4 per hour.  The mean oxygen saturation was 95.83%. The minimum SpO2 during sleep was 92.00%.  soft snoring was noted during this study.  CARDIAC DATA The 2 lead EKG demonstrated sinus rhythm. The mean heart rate was 84.84 beats per minute. Other EKG findings  include: None.  LEG MOVEMENT DATA The total PLMS were 0 with a resulting PLMS index of 0.00. Associated arousal with leg movement index was 0.0 .  IMPRESSIONS - No significant obstructive sleep apnea occurred during this study (AHI = 2.8/h). - No significant central sleep apnea occurred during this study (CAI = 0.5/h). - The patient had minimal or no oxygen desaturation during the study (Min O2 = 92.00%) - The patient snored with soft snoring volume. - No cardiac abnormalities were noted during this study. - Clinically significant periodic limb movements did not occur during sleep. No significant associated arousals  DIAGNOSIS - Primary Snoring  RECOMMENDATIONS - Treat for snoring and symptoms as clinically indicated. - Be careful with alcohol, sedatives and other CNS depressants that may worsen sleep apnea and disrupt normal sleep architecture. - Sleep hygiene should be reviewed to assess factors that may improve sleep quality. - Weight management and regular exercise should be initiated or continued if appropriate.  [Electronically signed] 12/03/2018 11:51 AM  Baird Lyons MD, ABSM Diplomate, American Board of Sleep Medicine   NPI: [id number]                         [place], [place] [facility]  ELECTRONICALLY SIGNED ON:  12/03/2018, 11:50 AM [place] PH: [phone]   FX: [phone] [place]

## 2018-12-06 NOTE — Progress Notes (Signed)
CMA spoke to patient to inform on results and PCP advising.  Pt. Understood. Pt. Verified DOB.

## 2019-01-10 ENCOUNTER — Other Ambulatory Visit: Payer: Self-pay | Admitting: Pharmacist

## 2019-01-10 DIAGNOSIS — J4520 Mild intermittent asthma, uncomplicated: Secondary | ICD-10-CM

## 2019-01-10 MED ORDER — ALBUTEROL SULFATE (2.5 MG/3ML) 0.083% IN NEBU: 2.5000 mg | INHALATION_SOLUTION | Freq: Four times a day (QID) | RESPIRATORY_TRACT | 0 refills | Status: DC | PRN

## 2019-01-17 IMAGING — DX DG CHEST 2V
2 series · 2 of 2 positions shown · non-contrast
Comparison: 07/15/2016

CLINICAL DATA: Wheezing, asthma.

EXAM:
CHEST  2 VIEW

[chest lat]
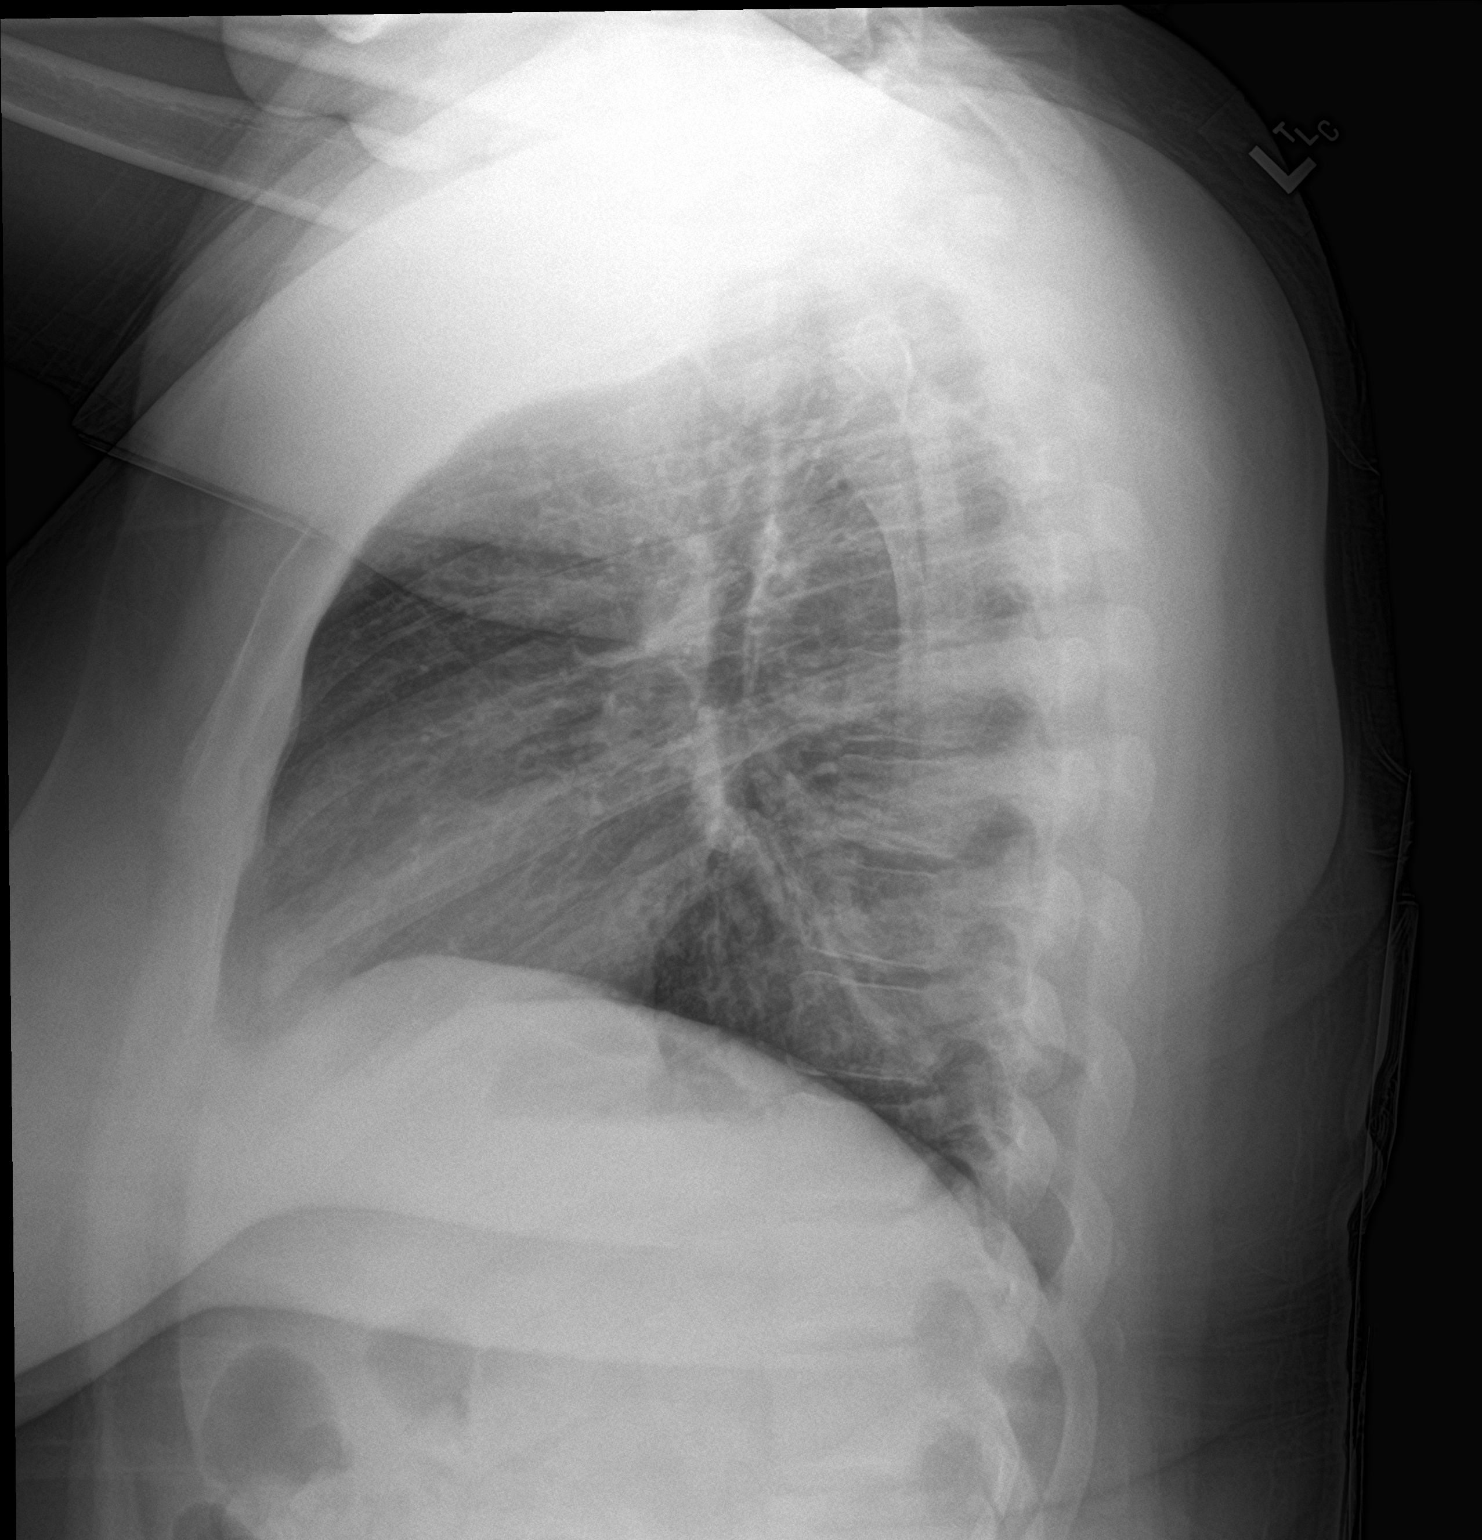

[chest ap]
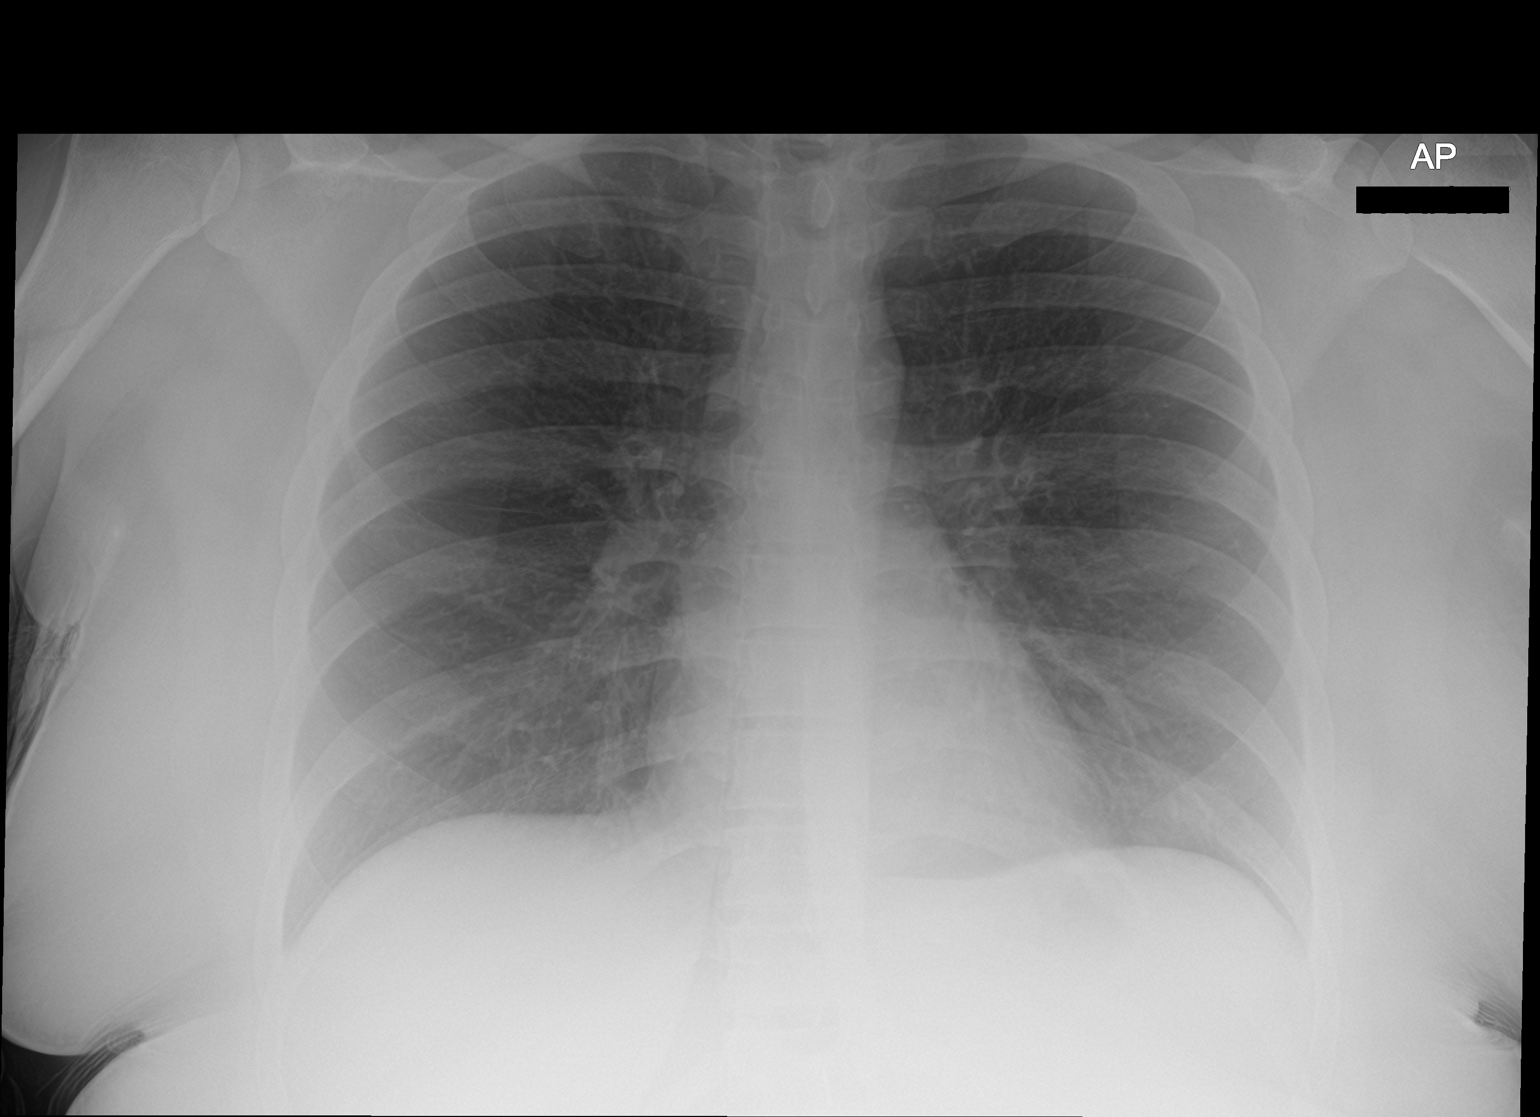

[2 of 2 positions shown; findings below may reference images not displayed]

FINDINGS: Heart and mediastinal contours are within normal limits. No focal
opacities or effusions. No acute bony abnormality.
IMPRESSION: No active cardiopulmonary disease.

## 2019-02-02 IMAGING — CR DG CHEST 2V
2 series · 2 of 2 positions shown · non-contrast
Comparison: Chest radiograph 01/27/2017

CLINICAL DATA: Shortness of breath

EXAM:
CHEST  2 VIEW

[w chest pa]
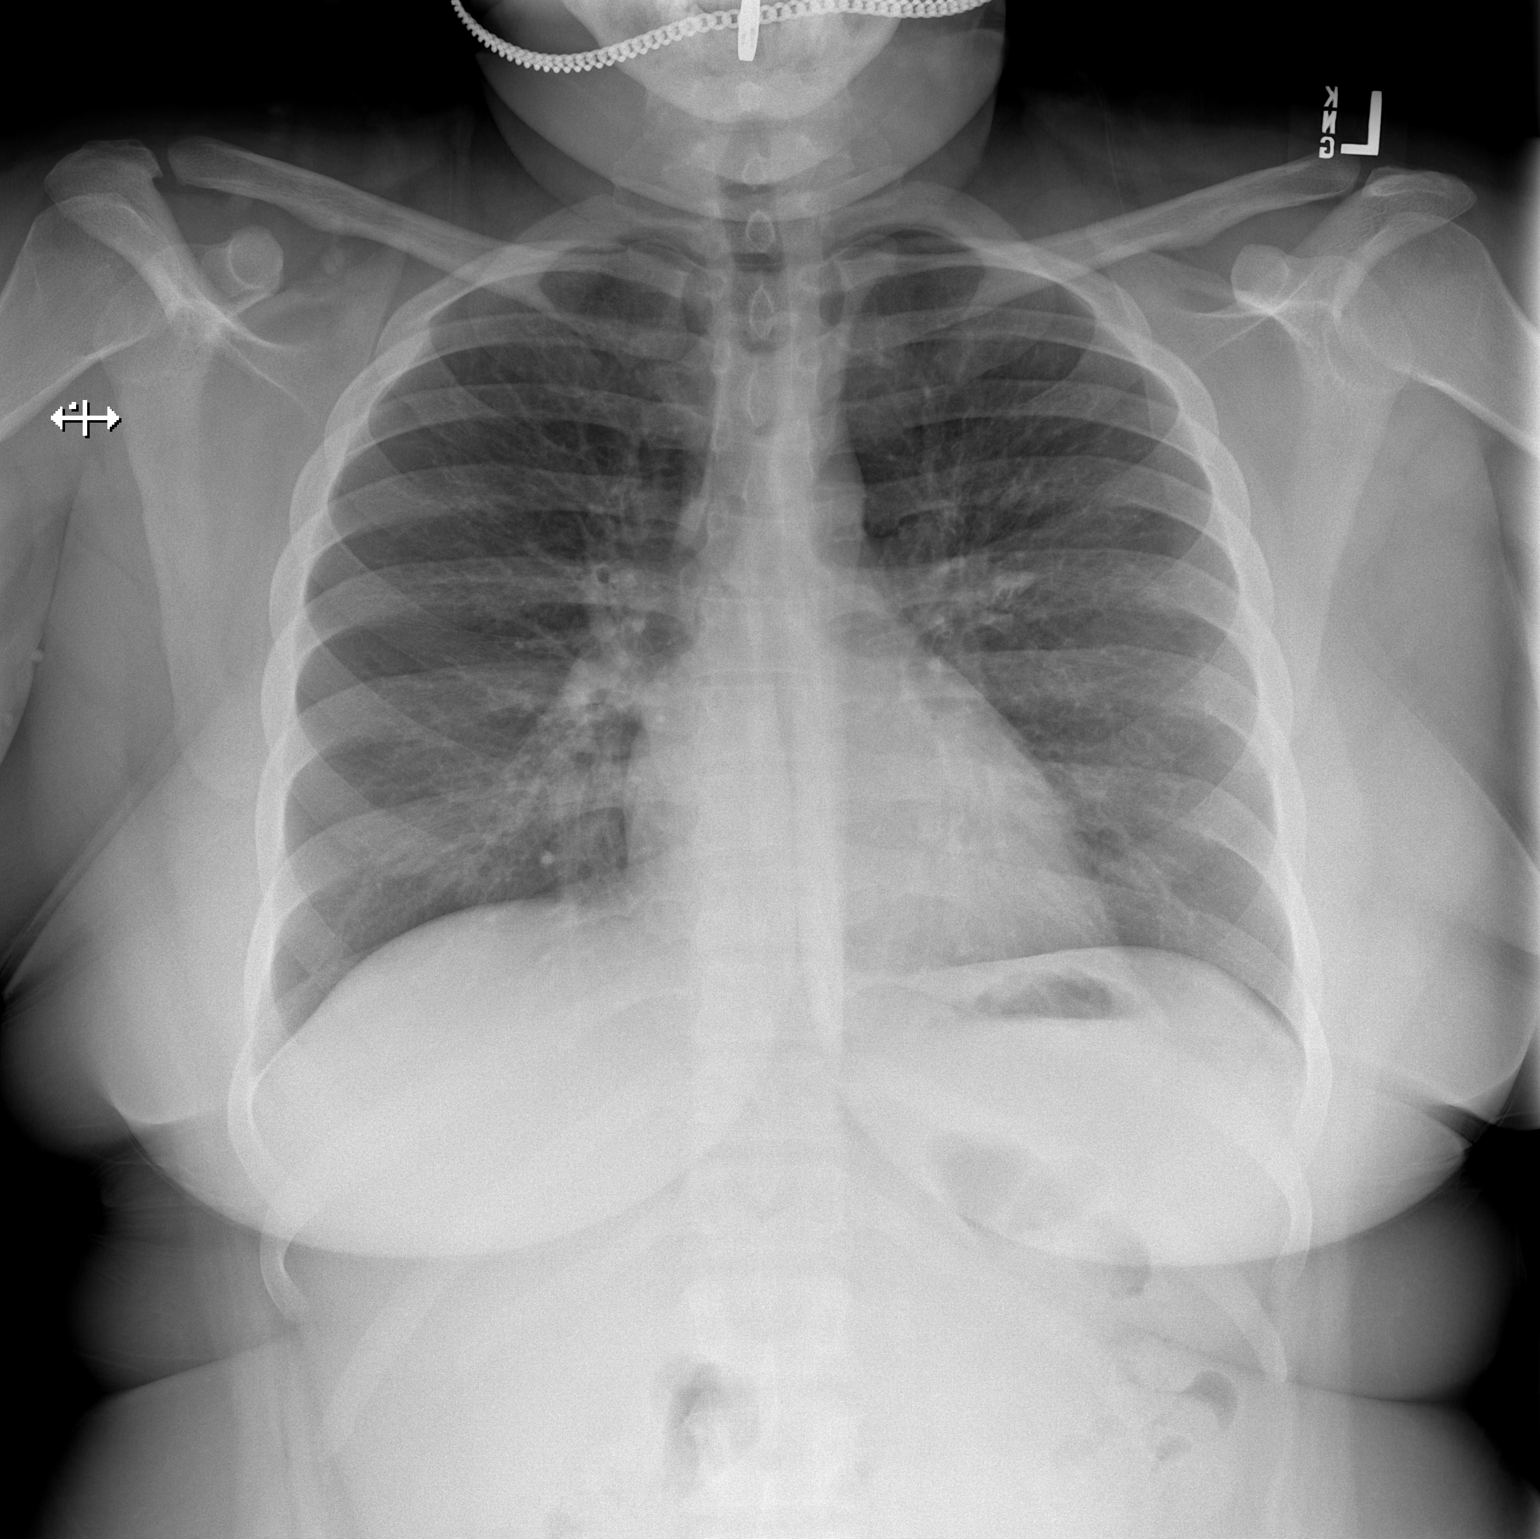

[w chest lat]
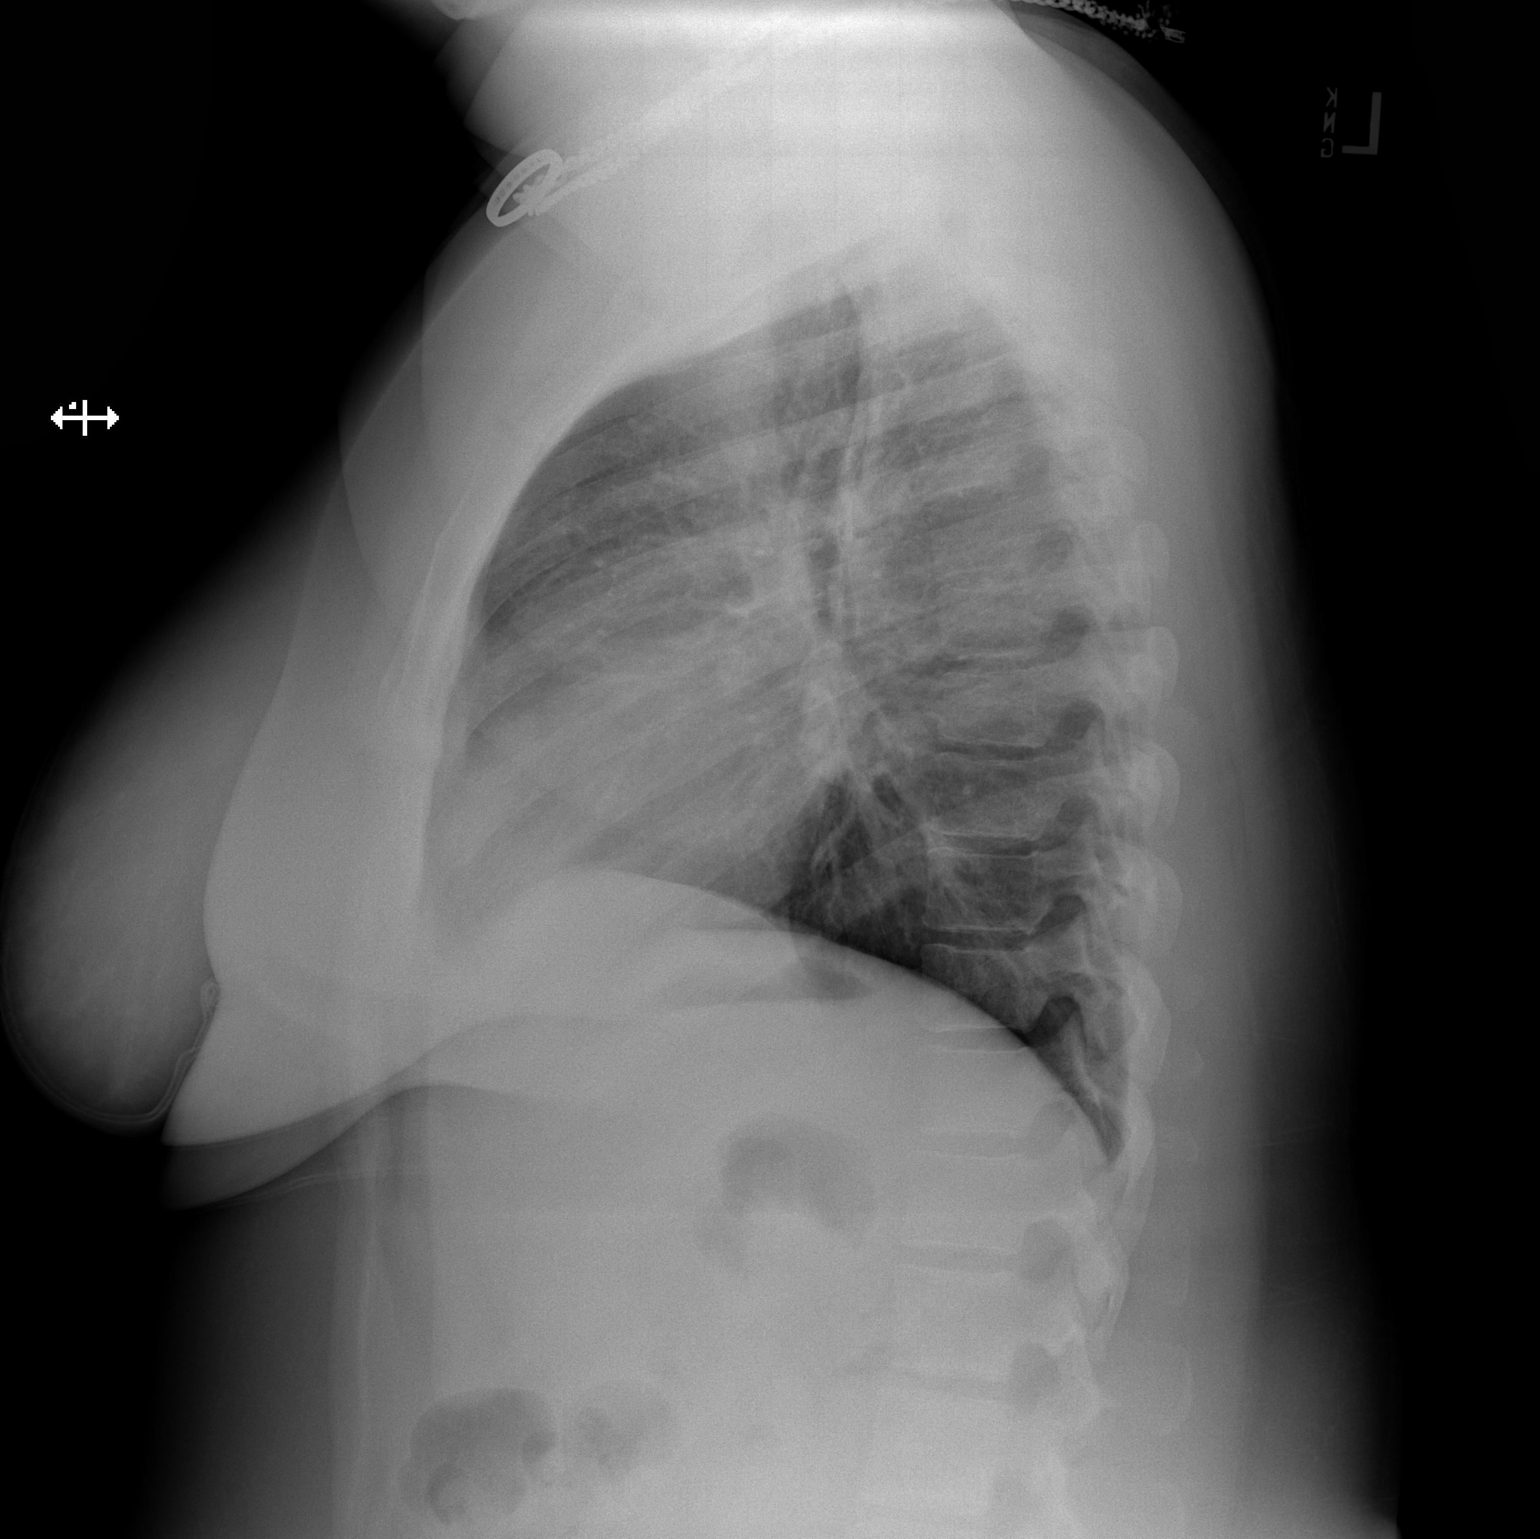

[2 of 2 positions shown; findings below may reference images not displayed]

FINDINGS: The heart size and mediastinal contours are within normal limits.
Both lungs are clear. The visualized skeletal structures are
unremarkable.
IMPRESSION: No active cardiopulmonary disease.

## 2019-02-20 ENCOUNTER — Other Ambulatory Visit: Payer: Self-pay | Admitting: Family Medicine

## 2019-02-20 DIAGNOSIS — J4520 Mild intermittent asthma, uncomplicated: Secondary | ICD-10-CM

## 2019-04-24 ENCOUNTER — Other Ambulatory Visit: Payer: Self-pay | Admitting: Family Medicine

## 2019-04-24 ENCOUNTER — Telehealth: Payer: Self-pay

## 2019-04-24 DIAGNOSIS — J4520 Mild intermittent asthma, uncomplicated: Secondary | ICD-10-CM

## 2019-04-24 NOTE — Telephone Encounter (Signed)
Pt has been non-compliant with requests to enroll for pt assistance for her Symbicort-we have been going back and forth since May 2020.Marland KitchenMarland KitchenSymbicort is too expensive for [place] to keep filling for pt-can we have therapy changed to advair 250 or breo 200?

## 2019-04-26 ENCOUNTER — Other Ambulatory Visit: Payer: Self-pay | Admitting: Nurse Practitioner

## 2019-04-26 MED ORDER — FLUTICASONE-SALMETEROL 250-50 MCG/DOSE IN AEPB: 1.0000 | INHALATION_SPRAY | Freq: Two times a day (BID) | RESPIRATORY_TRACT | 3 refills | Status: DC

## 2019-04-26 NOTE — Telephone Encounter (Signed)
Advair has been sent. Symbicort dc'd

## 2019-04-28 ENCOUNTER — Emergency Department (HOSPITAL_COMMUNITY): Payer: Self-pay

## 2019-04-28 ENCOUNTER — Encounter (HOSPITAL_COMMUNITY): Payer: Self-pay | Admitting: *Deleted

## 2019-04-28 ENCOUNTER — Other Ambulatory Visit: Payer: Self-pay

## 2019-04-28 ENCOUNTER — Emergency Department (HOSPITAL_COMMUNITY)
Admission: EM | Admit: 2019-04-28 | Discharge: 2019-04-28 | Disposition: A | Discharge status: Home / Self Care | Payer: Self-pay | Attending: Emergency Medicine | Admitting: Emergency Medicine

## 2019-04-28 DIAGNOSIS — J45909 Unspecified asthma, uncomplicated: Secondary | ICD-10-CM | POA: Insufficient documentation

## 2019-04-28 DIAGNOSIS — S4992XA Unspecified injury of left shoulder and upper arm, initial encounter: Secondary | ICD-10-CM | POA: Insufficient documentation

## 2019-04-28 DIAGNOSIS — X500XXA Overexertion from strenuous movement or load, initial encounter: Secondary | ICD-10-CM | POA: Insufficient documentation

## 2019-04-28 DIAGNOSIS — Y999 Unspecified external cause status: Secondary | ICD-10-CM | POA: Insufficient documentation

## 2019-04-28 DIAGNOSIS — Z79899 Other long term (current) drug therapy: Secondary | ICD-10-CM | POA: Insufficient documentation

## 2019-04-28 DIAGNOSIS — Y93H2 Activity, gardening and landscaping: Secondary | ICD-10-CM | POA: Insufficient documentation

## 2019-04-28 DIAGNOSIS — Y92007 Garden or yard of unspecified non-institutional (private) residence as the place of occurrence of the external cause: Secondary | ICD-10-CM | POA: Insufficient documentation

## 2019-04-28 DIAGNOSIS — Z87891 Personal history of nicotine dependence: Secondary | ICD-10-CM | POA: Insufficient documentation

## 2019-04-28 MED ORDER — LIDOCAINE 5 % EX PTCH
1.0000 | MEDICATED_PATCH | CUTANEOUS | Status: DC
  Administered 2019-04-28: 1 via TRANSDERMAL
  Filled 2019-04-28: qty 1

## 2019-04-28 MED ORDER — ACETAMINOPHEN 500 MG PO TABS
1000.0000 mg | ORAL_TABLET | Freq: Once | ORAL | Status: AC
  Administered 2019-04-28: 1000 mg via ORAL
  Filled 2019-04-28: qty 2

## 2019-04-28 MED ORDER — CYCLOBENZAPRINE HCL 10 MG PO TABS: 10.0000 mg | ORAL_TABLET | Freq: Two times a day (BID) | ORAL | 0 refills | Status: DC | PRN

## 2019-04-28 MED ORDER — NAPROXEN 500 MG PO TABS: 500.0000 mg | ORAL_TABLET | Freq: Two times a day (BID) | ORAL | 0 refills | Status: DC

## 2019-04-28 MED ORDER — NAPROXEN 500 MG PO TABS
500.0000 mg | ORAL_TABLET | Freq: Once | ORAL | Status: AC
  Administered 2019-04-28: 14:00:00 500 mg via ORAL
  Filled 2019-04-28: qty 1

## 2019-04-28 NOTE — ED Provider Notes (Signed)
[facility]-EMERGENCY DEPT Provider Note   CSN: [id number] Arrival date & time: 04/28/19  1241     History Chief Complaint  Patient presents with  . Shoulder Injury    Paula Wiggins is a 28 y.o. female.  Paula Wiggins is a 28 y.o. female with a history of asthma and GERD, presents to the ED for evaluation of left shoulder injury.  Patient states that 3 days ago on Tuesday she was helping her mom in the garden and carried a large heavy tree stump and ever since lifting that stump she has had pain in her left shoulder and felt an intermittent popping when she tries to move her left shoulder.  She denies any previous history of injury, surgery or dislocation with the shoulder.  States that over the past 2 days she is gone to work and had to continue to use that shoulder and do heavy lifting and this morning when she woke up she had significantly worsened pain.  She reports that when she holds her arm at a 90 degree angle across her body she is comfortable but if she lets her arm hang down by her side or tries to raise it overhead she has severe pain across the anterior shoulder.  She has not noticed any significant deformity or swelling.  No pain in the elbow or wrist.  No numbness weakness or tingling.  She has not taken anything at home to treat her symptoms prior to arrival.  No other aggravating or alleviating factors.        Past Medical History:  Diagnosis Date  . Asthma     Patient Active Problem List   Diagnosis Date Noted  . SOB (shortness of breath) 11/26/2018  . GERD (gastroesophageal reflux disease) 03/10/2017  . Mold exposure 03/10/2017  . Asthma, severe persistent 02/13/2017    History reviewed. No pertinent surgical history.   OB History   No obstetric history on file.     Family History  Problem Relation Age of Onset  . Hypertension Mother   . Asthma Mother   . Seizures Father   . Hypertension Father   . Congestive Heart Failure  Paternal Grandfather   . Asthma Sister   . Emphysema Maternal Grandmother   . Asthma Sister   . Multiple sclerosis Paternal Aunt     Social History   Tobacco Use  . Smoking status: Former Smoker    Types: Cigarettes  . Smokeless tobacco: Never Used  . Tobacco comment: Smoked for 1 month total.   Substance Use Topics  . Alcohol use: Not Currently  . Drug use: No    Home Medications Prior to Admission medications   Medication Sig Start Date End Date Taking? Authorizing Provider  albuterol (PROVENTIL) (2.5 MG/3ML) 0.083% nebulizer solution TAKE 3 MLS (2.5 MG TOTAL) BY NEBULIZATION EVERY 6 (SIX) HOURS AS NEEDED FOR WHEEZING OR SHORTNESS OF BREATH. 02/20/19   Hoy RegisterNewlin, Enobong, MD  albuterol (VENTOLIN HFA) 108 (90 Base) MCG/ACT inhaler INHALE 2 PUFFS INTO THE LUNGS EVERY 6 (SIX) HOURS AS NEEDED FOR WHEEZING OR SHORTNESS OF BREATH. 10/03/18   Claiborne RiggFleming, Zelda W, NP  cetirizine (ZYRTEC) 10 MG tablet Take 1 tablet (10 mg total) by mouth daily. 10/04/18   Storm FriskWright, Patrick E, MD  cyclobenzaprine (FLEXERIL) 10 MG tablet Take 1 tablet (10 mg total) by mouth 2 (two) times daily as needed for muscle spasms. 04/28/19   Dartha LodgeFord, Eliyana Pagliaro N, PA-C  fluticasone (FLONASE) 50 MCG/ACT nasal spray Place 2 sprays  into both nostrils daily. 10/04/18   Elsie Stain, MD  Fluticasone-Salmeterol (ADVAIR DISKUS) 250-50 MCG/DOSE AEPB Inhale 1 puff into the lungs 2 (two) times daily. 04/26/19   Gildardo Pounds, NP  montelukast (SINGULAIR) 10 MG tablet Take 1 tablet (10 mg total) by mouth at bedtime. 10/03/18   Gildardo Pounds, NP  naproxen (NAPROSYN) 500 MG tablet Take 1 tablet (500 mg total) by mouth 2 (two) times daily. 04/28/19   Jacqlyn Larsen, PA-C  pantoprazole (PROTONIX) 40 MG tablet Take 1 tablet (40 mg total) by mouth 2 (two) times daily before a meal. 10/04/18   Elsie Stain, MD  predniSONE (DELTASONE) 10 MG tablet Take 4 tablets daily for 5 days then stop 10/04/18   Elsie Stain, MD  traZODone (DESYREL) 100  MG tablet Take 1 tablet (100 mg total) by mouth at bedtime. 08/30/18 11/28/18  Gildardo Pounds, NP    Allergies    Oxycodone and Watermelon flavor  Review of Systems   Review of Systems  Constitutional: Negative for chills and fever.  Musculoskeletal: Positive for arthralgias. Negative for joint swelling.  Skin: Negative for color change, rash and wound.  Neurological: Negative for weakness and numbness.    Physical Exam Updated Vital Signs BP (!) 148/111   Pulse (!) 105   Temp 98.3 F (36.8 C) (Oral)   Ht 5\' 4"  (1.626 m)   Wt 112.5 kg   LMP 04/19/2019   SpO2 100%   BMI 42.57 kg/m   Physical Exam Vitals and nursing note reviewed.  Constitutional:      General: She is not in acute distress.    Appearance: Normal appearance. She is well-developed. She is obese. She is not diaphoretic.  HENT:     Head: Normocephalic and atraumatic.  Eyes:     General:        Right eye: No discharge.        Left eye: No discharge.  Pulmonary:     Effort: Pulmonary effort is normal. No respiratory distress.  Musculoskeletal:     Comments: Tenderness to palpation over the left shoulder most notably over the anterior joint, no obvious deformity.  No significant tenderness over the clavicle.  No palpable AC separation or shoulder instability.  Passive range of motion intact but painful.  Distal pulses 2+, 5/5 grip strength, sensation intact.  Skin:    General: Skin is warm and dry.  Neurological:     Mental Status: She is alert.     Coordination: Coordination normal.  Psychiatric:        Behavior: Behavior normal.     ED Results / Procedures / Treatments   Labs (all labs ordered are listed, but only abnormal results are displayed) Labs Reviewed - No data to display  EKG None  Radiology DG Shoulder Left  Result Date: 04/28/2019 CLINICAL DATA:  Pain and popping in the left shoulder since a lifting injury 3 days ago. EXAM: LEFT SHOULDER - 2+ VIEW COMPARISON:  Radiographs dated  02/18/2015 FINDINGS: There is no evidence of fracture or dislocation. There is no evidence of arthropathy or other focal bone abnormality. Soft tissues are unremarkable. IMPRESSION: Normal exam. Electronically Signed   By: Lorriane Shire M.D.   On: 04/28/2019 13:42    Procedures Procedures (including critical care time)  Medications Ordered in ED Medications  naproxen (NAPROSYN) tablet 500 mg (has no administration in time range)  acetaminophen (TYLENOL) tablet 1,000 mg (has no administration in time range)  lidocaine (  LIDODERM) 5 % 1 patch (has no administration in time range)    ED Course  I have reviewed the triage vital signs and the nursing notes.  Pertinent labs & imaging results that were available during my care of the patient were reviewed by me and considered in my medical decision making (see chart for details).  Clinical Course as of Apr 28 1403  Fri Apr 28, 2019  1400 Shoulder x-ray shows no evidence of fracture, dislocation or AC joint separation  DG Shoulder Left [KF]    Clinical Course User Index [KF] Schiraldi Rams   MDM Rules/Calculators/A&P  28 year old female presents with left shoulder injury with pain worsening over the past 3 days, she has been having to do heavy lifting at work since the injury which has worsened her pain.  On exam there is no swelling or erythema and she has not had any fevers, no concern for septic arthritis.  Active range of motion limited by pain, passive range of motion intact, low suspicion for adhesive capsulitis.  The left upper extremity is neurovascularly intact.  X-ray shows no dislocation or fracture.  Suspect rotator cuff tendinopathy, will place patient in sling for comfort but have given her range of motion exercises and stressed the importance of doing this multiple times a day to prevent frozen shoulder.  Will treat with NSAIDs, topical lidocaine patches and muscle relaxers and have patient follow-up with orthopedics if pain  is not improving.  Return precautions discussed.  Patient expresses understanding and agreement with plan.  Discharged home in good condition.  Final Clinical Impression(s) / ED Diagnoses Final diagnoses:  Injury of left shoulder, initial encounter    Rx / DC Orders ED Discharge Orders         Ordered    naproxen (NAPROSYN) 500 MG tablet  2 times daily     04/28/19 1404    cyclobenzaprine (FLEXERIL) 10 MG tablet  2 times daily PRN     04/28/19 1404           Dartha Lodge, [place] 04/28/19 1404    Lorre Nick, MD 05/01/19 1725

## 2019-04-28 NOTE — Discharge Instructions (Signed)
Please take naproxen twice daily with food and Tylenol 1000 mg every 6 hours as needed for additional pain.  You can also use over-the-counter salon pas lidocaine patches which can be worn for 12 hours on, 12 hours off.  You can use prescribed muscle relaxer as needed for spasm, this can cause drowsiness so do not take before driving.  Alternate ice and heat over the shoulder.  You can use sling for support but you need to take your shoulder out of the sling and do the provided range of motion exercises at least 3-4 times daily to prevent frozen shoulder.  If symptoms are not improving over the next week please follow-up with orthopedics.

## 2019-04-28 NOTE — ED Triage Notes (Signed)
Helping mother with yard work on Tuesday, tried to pick up a stump, pain and popping in left shoulder since event, unable to sleep or use arm

## 2019-05-18 ENCOUNTER — Other Ambulatory Visit: Payer: Self-pay | Admitting: Nurse Practitioner

## 2019-05-18 DIAGNOSIS — J4540 Moderate persistent asthma, uncomplicated: Secondary | ICD-10-CM

## 2019-06-23 ENCOUNTER — Other Ambulatory Visit: Payer: Self-pay | Admitting: Family Medicine

## 2019-06-23 DIAGNOSIS — J4520 Mild intermittent asthma, uncomplicated: Secondary | ICD-10-CM

## 2019-09-01 ENCOUNTER — Emergency Department (HOSPITAL_COMMUNITY)
Admission: EM | Admit: 2019-09-01 | Discharge: 2019-09-01 | Disposition: A | Discharge status: Home / Self Care | Payer: Self-pay | Attending: Emergency Medicine | Admitting: Emergency Medicine

## 2019-09-01 ENCOUNTER — Other Ambulatory Visit: Payer: Self-pay

## 2019-09-01 ENCOUNTER — Encounter (HOSPITAL_COMMUNITY): Payer: Self-pay | Admitting: Emergency Medicine

## 2019-09-01 DIAGNOSIS — R519 Headache, unspecified: Secondary | ICD-10-CM | POA: Insufficient documentation

## 2019-09-01 DIAGNOSIS — H9201 Otalgia, right ear: Secondary | ICD-10-CM | POA: Insufficient documentation

## 2019-09-01 DIAGNOSIS — J4550 Severe persistent asthma, uncomplicated: Secondary | ICD-10-CM | POA: Insufficient documentation

## 2019-09-01 DIAGNOSIS — Z79899 Other long term (current) drug therapy: Secondary | ICD-10-CM | POA: Insufficient documentation

## 2019-09-01 LAB — I-STAT BETA HCG BLOOD, ED (MC, WL, AP ONLY): I-stat hCG, quantitative: <5 m[IU]/mL (ref <5)

## 2019-09-01 MED ORDER — MECLIZINE HCL 25 MG PO TABS: 25.0000 mg | ORAL_TABLET | Freq: Two times a day (BID) | ORAL | 0 refills | Status: DC | PRN

## 2019-09-01 MED ORDER — DEXAMETHASONE 4 MG PO TABS
10.0000 mg | ORAL_TABLET | Freq: Once | ORAL | Status: AC
  Administered 2019-09-01: 10 mg via ORAL
  Filled 2019-09-01: qty 2

## 2019-09-01 MED ORDER — ACETAMINOPHEN 325 MG PO TABS
650.0000 mg | ORAL_TABLET | Freq: Once | ORAL | Status: AC
  Administered 2019-09-01: 650 mg via ORAL
  Filled 2019-09-01: qty 2

## 2019-09-01 NOTE — ED Provider Notes (Signed)
[facility]   CSN: [id number] Arrival date & time: 09/01/19  4696     History Chief Complaint  Patient presents with  . Headache    Paula Wiggins is a 29 y.o. female.  The history is provided by the patient.  Headache Pain location:  Frontal Radiates to:  Does not radiate Onset quality:  Gradual Timing:  Intermittent Progression:  Waxing and waning Chronicity:  New Context: not exposure to bright light   Relieved by:  NSAIDs and acetaminophen Worsened by:  Nothing Associated symptoms: ear pain (ear pressure, intermittent)   Associated symptoms: no abdominal pain, no back pain, no blurred vision, no congestion, no cough, no dizziness, no drainage, no eye pain, no facial pain, no fatigue, no fever, no loss of balance, no myalgias, no neck stiffness, no numbness, no seizures, no sore throat, no visual change, no vomiting and no weakness        Past Medical History:  Diagnosis Date  . Asthma     Patient Active Problem List   Diagnosis Date Noted  . SOB (shortness of breath) 11/26/2018  . GERD (gastroesophageal reflux disease) 03/10/2017  . Mold exposure 03/10/2017  . Asthma, severe persistent 02/13/2017    History reviewed. No pertinent surgical history.   OB History   No obstetric history on file.     Family History  Problem Relation Age of Onset  . Hypertension Mother   . Asthma Mother   . Seizures Father   . Hypertension Father   . Congestive Heart Failure Paternal Grandfather   . Asthma Sister   . Emphysema Maternal Grandmother   . Asthma Sister   . Multiple sclerosis Paternal Aunt     Social History   Tobacco Use  . Smoking status: Former Smoker    Types: Cigarettes  . Smokeless tobacco: Never Used  . Tobacco comment: Smoked for 1 month total.   Substance Use Topics  . Alcohol use: Not Currently  . Drug use: No    Home Medications Prior to Admission medications   Medication Sig Start  Date End Date Taking? Authorizing Provider  albuterol (PROVENTIL) (2.5 MG/3ML) 0.083% nebulizer solution TAKE 3 MLS (2.5 MG TOTAL) BY NEBULIZATION EVERY 6 (SIX) HOURS AS NEEDED FOR WHEEZING OR SHORTNESS OF BREATH. 02/20/19   Hoy Register, MD  albuterol (VENTOLIN HFA) 108 (90 Base) MCG/ACT inhaler INHALE 2 PUFFS INTO THE LUNGS EVERY 6 (SIX) HOURS AS NEEDED FOR WHEEZING OR SHORTNESS OF BREATH. 05/18/19   Hoy Register, MD  cetirizine (ZYRTEC) 10 MG tablet Take 1 tablet (10 mg total) by mouth daily. 10/04/18   Storm Frisk, MD  cyclobenzaprine (FLEXERIL) 10 MG tablet Take 1 tablet (10 mg total) by mouth 2 (two) times daily as needed for muscle spasms. 04/28/19   Dartha Lodge, PA-C  fluticasone (FLONASE) 50 MCG/ACT nasal spray Place 2 sprays into both nostrils daily. 10/04/18   Storm Frisk, MD  Fluticasone-Salmeterol (ADVAIR DISKUS) 250-50 MCG/DOSE AEPB Inhale 1 puff into the lungs 2 (two) times daily. 04/26/19   Claiborne Rigg, NP  meclizine (ANTIVERT) 25 MG tablet Take 1 tablet (25 mg total) by mouth 2 (two) times daily as needed for up to 10 doses for dizziness or nausea. 09/01/19   Annaelle Kasel, DO  montelukast (SINGULAIR) 10 MG tablet Take 1 tablet (10 mg total) by mouth at bedtime. 10/03/18   Claiborne Rigg, NP  naproxen (NAPROSYN) 500 MG tablet Take 1 tablet (500 mg total)  by mouth 2 (two) times daily. 04/28/19   Jacqlyn Larsen, PA-C  pantoprazole (PROTONIX) 40 MG tablet Take 1 tablet (40 mg total) by mouth 2 (two) times daily before a meal. 10/04/18   Elsie Stain, MD  predniSONE (DELTASONE) 10 MG tablet Take 4 tablets daily for 5 days then stop 10/04/18   Elsie Stain, MD  traZODone (DESYREL) 100 MG tablet Take 1 tablet (100 mg total) by mouth at bedtime. 08/30/18 11/28/18  Gildardo Pounds, NP    Allergies    Oxycodone and Watermelon flavor  Review of Systems   Review of Systems  Constitutional: Negative for chills, fatigue and fever.  HENT: Positive for ear pain  (ear pressure, intermittent). Negative for congestion, ear discharge, postnasal drip and sore throat.   Eyes: Negative for blurred vision, pain and visual disturbance.  Respiratory: Negative for cough and shortness of breath.   Cardiovascular: Negative for chest pain and palpitations.  Gastrointestinal: Negative for abdominal pain and vomiting.  Genitourinary: Negative for dysuria and hematuria.  Musculoskeletal: Negative for arthralgias, back pain, myalgias and neck stiffness.  Skin: Negative for color change and rash.  Neurological: Positive for headaches. Negative for dizziness, tremors, seizures, syncope, facial asymmetry, speech difficulty, weakness, light-headedness, numbness and loss of balance.  All other systems reviewed and are negative.   Physical Exam Updated Vital Signs  ED Triage Vitals  Enc Vitals Group     BP 09/01/19 0837 128/90     Pulse Rate 09/01/19 0837 85     Resp 09/01/19 0837 16     Temp 09/01/19 0837 98.2 F (36.8 C)     Temp Source 09/01/19 0837 Oral     SpO2 09/01/19 0837 99 %     Weight --      Height --      Head Circumference --      Peak Flow --      Pain Score 09/01/19 0838 10     Pain Loc --      Pain Edu? --      Excl. in [place]? --     Physical Exam Vitals and nursing note reviewed.  Constitutional:      General: She is not in acute distress.    Appearance: She is well-developed. She is not ill-appearing.  HENT:     Head: Normocephalic and atraumatic.     Comments: TMs clear Eyes:     General: No visual field deficit.    Extraocular Movements: Extraocular movements intact.     Right eye: Normal extraocular motion.     Left eye: Normal extraocular motion.     Conjunctiva/sclera: Conjunctivae normal.     Pupils: Pupils are equal, round, and reactive to light.  Cardiovascular:     Rate and Rhythm: Normal rate and regular rhythm.     Heart sounds: Normal heart sounds. No murmur.  Pulmonary:     Effort: Pulmonary effort is normal. No  respiratory distress.     Breath sounds: Normal breath sounds.  Abdominal:     Palpations: Abdomen is soft.     Tenderness: There is no abdominal tenderness.  Musculoskeletal:        General: Normal range of motion.     Cervical back: Normal range of motion and neck supple.  Skin:    General: Skin is warm and dry.  Neurological:     Mental Status: She is alert and oriented to person, place, and time.     Cranial Nerves: No  cranial nerve deficit, dysarthria or facial asymmetry.     Sensory: No sensory deficit.     Motor: No weakness.     Coordination: Coordination normal.     Gait: Gait normal.  Psychiatric:        Mood and Affect: Mood normal.     ED Results / Procedures / Treatments   Labs (all labs ordered are listed, but only abnormal results are displayed) Labs Reviewed  I-STAT BETA HCG BLOOD, ED ([facility], [facility], [facility] ONLY)    EKG None  Radiology No results found.  Procedures Procedures (including critical care time)  Medications Ordered in ED Medications - No data to display  ED Course  I have reviewed the triage vital signs and the nursing notes.  Pertinent labs & imaging results that were available during my care of the patient were reviewed by me and considered in my medical decision making (see chart for details).    MDM Rules/Calculators/A&P                      Paula Wiggins is a 29 year old female with history of asthma and allergy who presents to the ED with headache.  Patient with overall normal vitals.  No fever.  Normal neurological exam.  On and off head pressure for the last week.  Having some ear pressure as well in the right side.  No signs of ear infection.  No concern for stroke or intracranial process.  Likely sinus related headache.  Pregnancy test is negative.  Patient given Decadron.  Recommend continued use of Tylenol Motrin at home.  Patient given prescription for meclizine as may be some vertigo component but overall asymptomatic from that  standpoint but told to use if she got dizzy, nauseous.  Given return precautions.  Discharged in ED in good condition.  This chart was dictated using voice recognition software.  Despite best efforts to proofread,  errors can occur which can change the documentation meaning.    Final Clinical Impression(s) / ED Diagnoses Final diagnoses:  Nonintractable headache, unspecified chronicity pattern, unspecified headache type    Rx / DC Orders ED Discharge Orders         Ordered    meclizine (ANTIVERT) 25 MG tablet  2 times daily PRN     09/01/19 0928           Virgina Norfolk, DO 09/01/19 [phone]

## 2019-09-01 NOTE — ED Triage Notes (Addendum)
Per pt, states pressure on top of head for one week-no relief with OTC sinus medication-states sometimes she loses her hearing when pressure occures

## 2019-09-04 ENCOUNTER — Other Ambulatory Visit: Payer: Self-pay

## 2019-09-04 ENCOUNTER — Encounter (HOSPITAL_COMMUNITY): Payer: Self-pay | Admitting: Emergency Medicine

## 2019-09-04 ENCOUNTER — Emergency Department (HOSPITAL_COMMUNITY)
Admission: EM | Admit: 2019-09-04 | Discharge: 2019-09-04 | Disposition: A | Discharge status: Home / Self Care | Payer: Self-pay | Attending: Emergency Medicine | Admitting: Emergency Medicine

## 2019-09-04 DIAGNOSIS — R519 Headache, unspecified: Secondary | ICD-10-CM | POA: Insufficient documentation

## 2019-09-04 DIAGNOSIS — Z5321 Procedure and treatment not carried out due to patient leaving prior to being seen by health care provider: Secondary | ICD-10-CM | POA: Insufficient documentation

## 2019-09-04 NOTE — ED Triage Notes (Signed)
Pt arrives to ER with a headache for 2 weeks that at times makes her feel lightheaded. Pt was seen on 4/16 at [facility] for same and states she is no better today.

## 2019-09-04 NOTE — ED Notes (Signed)
Pt left waiting room

## 2019-09-07 ENCOUNTER — Other Ambulatory Visit: Payer: Self-pay

## 2019-09-07 ENCOUNTER — Emergency Department (HOSPITAL_COMMUNITY): Payer: Self-pay

## 2019-09-07 ENCOUNTER — Emergency Department (HOSPITAL_COMMUNITY)
Admission: EM | Admit: 2019-09-07 | Discharge: 2019-09-07 | Disposition: A | Discharge status: Home / Self Care | Payer: Self-pay | Attending: Emergency Medicine | Admitting: Emergency Medicine

## 2019-09-07 ENCOUNTER — Encounter (HOSPITAL_COMMUNITY): Payer: Self-pay | Admitting: Emergency Medicine

## 2019-09-07 DIAGNOSIS — R112 Nausea with vomiting, unspecified: Secondary | ICD-10-CM | POA: Insufficient documentation

## 2019-09-07 DIAGNOSIS — M791 Myalgia, unspecified site: Secondary | ICD-10-CM | POA: Insufficient documentation

## 2019-09-07 DIAGNOSIS — Z20822 Contact with and (suspected) exposure to covid-19: Secondary | ICD-10-CM | POA: Insufficient documentation

## 2019-09-07 DIAGNOSIS — Z79899 Other long term (current) drug therapy: Secondary | ICD-10-CM | POA: Insufficient documentation

## 2019-09-07 DIAGNOSIS — R030 Elevated blood-pressure reading, without diagnosis of hypertension: Secondary | ICD-10-CM | POA: Insufficient documentation

## 2019-09-07 DIAGNOSIS — Z87891 Personal history of nicotine dependence: Secondary | ICD-10-CM | POA: Insufficient documentation

## 2019-09-07 DIAGNOSIS — J45909 Unspecified asthma, uncomplicated: Secondary | ICD-10-CM | POA: Insufficient documentation

## 2019-09-07 DIAGNOSIS — R0600 Dyspnea, unspecified: Secondary | ICD-10-CM | POA: Insufficient documentation

## 2019-09-07 DIAGNOSIS — R519 Headache, unspecified: Secondary | ICD-10-CM

## 2019-09-07 DIAGNOSIS — E860 Dehydration: Secondary | ICD-10-CM

## 2019-09-07 DIAGNOSIS — M542 Cervicalgia: Secondary | ICD-10-CM | POA: Insufficient documentation

## 2019-09-07 DIAGNOSIS — R410 Disorientation, unspecified: Secondary | ICD-10-CM | POA: Insufficient documentation

## 2019-09-07 DIAGNOSIS — R630 Anorexia: Secondary | ICD-10-CM | POA: Insufficient documentation

## 2019-09-07 DIAGNOSIS — R531 Weakness: Secondary | ICD-10-CM

## 2019-09-07 LAB — RAPID URINE DRUG SCREEN, HOSP PERFORMED
Amphetamines: NOT DETECTED
Barbiturates: NOT DETECTED
Benzodiazepines: NOT DETECTED
Cocaine: NOT DETECTED
Opiates: NOT DETECTED
Tetrahydrocannabinol: NOT DETECTED

## 2019-09-07 LAB — URINALYSIS, ROUTINE W REFLEX MICROSCOPIC
Bilirubin Urine: NEGATIVE
Glucose, UA: NEGATIVE mg/dL
Hgb urine dipstick: NEGATIVE
Ketones, ur: 80 mg/dL — AB
Nitrite: NEGATIVE
Protein, ur: 30 mg/dL — AB
Specific Gravity, Urine: 1.025 (ref 1.005–1.030)
pH: 7 (ref 5.0–8.0)

## 2019-09-07 LAB — COMPREHENSIVE METABOLIC PANEL
ALT: 21 U/L (ref 0–44)
AST: 16 U/L (ref 15–41)
Albumin: 4.4 g/dL (ref 3.5–5.0)
Alkaline Phosphatase: 77 U/L (ref 38–126)
Anion gap: 11 (ref 5–15)
BUN: 14 mg/dL (ref 6–20)
CO2: 20 mmol/L — ABNORMAL LOW (ref 22–32)
Calcium: 9.2 mg/dL (ref 8.9–10.3)
Chloride: 105 mmol/L (ref 98–111)
Creatinine, Ser: 0.77 mg/dL (ref 0.44–1.00)
GFR calc Af Amer: >60 mL/min (ref >60)
GFR calc non Af Amer: >60 mL/min (ref >60)
Glucose, Bld: 118 mg/dL — ABNORMAL HIGH (ref 70–99)
Potassium: 3.5 mmol/L (ref 3.5–5.1)
Sodium: 136 mmol/L (ref 135–145)
Total Bilirubin: 0.9 mg/dL (ref 0.3–1.2)
Total Protein: 8.6 g/dL — ABNORMAL HIGH (ref 6.5–8.1)

## 2019-09-07 LAB — CBC WITH DIFFERENTIAL/PLATELET
Abs Immature Granulocytes: 0.07 10*3/uL (ref 0.00–0.07)
Basophils Absolute: 0 10*3/uL (ref 0.0–0.1)
Basophils Relative: 0 %
Eosinophils Absolute: 0 10*3/uL (ref 0.0–0.5)
Eosinophils Relative: 0 %
HCT: 44.1 % (ref 36.0–46.0)
Hemoglobin: 14.9 g/dL (ref 12.0–15.0)
Immature Granulocytes: 0 %
Lymphocytes Relative: 8 %
Lymphs Abs: 1.5 10*3/uL (ref 0.7–4.0)
MCH: 32.7 pg (ref 26.0–34.0)
MCHC: 33.8 g/dL (ref 30.0–36.0)
MCV: 96.9 fL (ref 80.0–100.0)
Monocytes Absolute: 0.5 10*3/uL (ref 0.1–1.0)
Monocytes Relative: 3 %
Neutro Abs: 17 10*3/uL — ABNORMAL HIGH (ref 1.7–7.7)
Neutrophils Relative %: 89 %
Platelets: 346 10*3/uL (ref 150–400)
RBC: 4.55 MIL/uL (ref 3.87–5.11)
RDW: 12.9 % (ref 11.5–15.5)
WBC: 19 10*3/uL — ABNORMAL HIGH (ref 4.0–10.5)
nRBC: 0 % (ref 0.0–0.2)

## 2019-09-07 LAB — RESPIRATORY PANEL BY RT PCR (FLU A&B, COVID)
Influenza A by PCR: NEGATIVE
Influenza B by PCR: NEGATIVE
SARS Coronavirus 2 by RT PCR: NEGATIVE

## 2019-09-07 LAB — LACTIC ACID, PLASMA: Lactic Acid, Venous: 1.9 mmol/L (ref 0.5–1.9)

## 2019-09-07 LAB — CK: Total CK: 33 U/L — ABNORMAL LOW (ref 38–234)

## 2019-09-07 LAB — I-STAT BETA HCG BLOOD, ED (MC, WL, AP ONLY): I-stat hCG, quantitative: <5 m[IU]/mL (ref <5)

## 2019-09-07 LAB — ETHANOL: Alcohol, Ethyl (B): <10 mg/dL (ref <10)

## 2019-09-07 MED ORDER — SODIUM CHLORIDE 0.9 % IV BOLUS
1000.0000 mL | Freq: Once | INTRAVENOUS | Status: AC
  Administered 2019-09-07: 1000 mL via INTRAVENOUS

## 2019-09-07 MED ORDER — PROCHLORPERAZINE EDISYLATE 10 MG/2ML IJ SOLN
10.0000 mg | Freq: Once | INTRAMUSCULAR | Status: AC
  Administered 2019-09-07: 10 mg via INTRAVENOUS
  Filled 2019-09-07: qty 2

## 2019-09-07 MED ORDER — SODIUM CHLORIDE 0.9 % IV SOLN: INTRAVENOUS | Status: DC

## 2019-09-07 MED ORDER — PROCHLORPERAZINE MALEATE 10 MG PO TABS: 10.0000 mg | ORAL_TABLET | Freq: Two times a day (BID) | ORAL | 0 refills | Status: DC | PRN

## 2019-09-07 NOTE — ED Provider Notes (Signed)
[facility] DEPT Provider Note   CSN: [id number] Arrival date & time: 09/07/19  1309     History Chief Complaint  Patient presents with  . Dizziness  . Headache  . Emesis    Paula Wiggins is a 29 y.o. female.  HPI    Patient presents for the second time in 10 days with concern for persistent diffuse myalgia, headache, weakness, dyspnea.  She states that she has a history of asthma, but is otherwise well, and was so prior to the development of illness which occurred about 10 days ago.  Since that time she has had persistent symptoms in spite of trying her typical medication as well as other OTC meds.  She complains of diffuse discomfort, though seems to have more soreness about her head, neck, as well as throughout her body. She denies focal anterior chest pain, or abdominal pain.  She does however, have nausea, anorexia.  She endorses confusion, slowness of thought, no speech deficit, vision changes. She is seen and evaluated here, notes that she was about the same following the time, but has become worse, without being able to distinguish how, over the past 2 or 3 days.   Past Medical History:  Diagnosis Date  . Asthma     Patient Active Problem List   Diagnosis Date Noted  . SOB (shortness of breath) 11/26/2018  . GERD (gastroesophageal reflux disease) 03/10/2017  . Mold exposure 03/10/2017  . Asthma, severe persistent 02/13/2017    History reviewed. No pertinent surgical history.   OB History   No obstetric history on file.     Family History  Problem Relation Age of Onset  . Hypertension Mother   . Asthma Mother   . Seizures Father   . Hypertension Father   . Congestive Heart Failure Paternal Grandfather   . Asthma Sister   . Emphysema Maternal Grandmother   . Asthma Sister   . Multiple sclerosis Paternal Aunt     Social History   Tobacco Use  . Smoking status: Former Smoker    Types: Cigarettes  . Smokeless  tobacco: Never Used  . Tobacco comment: Smoked for 1 month total.   Substance Use Topics  . Alcohol use: Not Currently  . Drug use: No    Home Medications Prior to Admission medications   Medication Sig Start Date End Date Taking? Authorizing Provider  albuterol (PROVENTIL) (2.5 MG/3ML) 0.083% nebulizer solution TAKE 3 MLS (2.5 MG TOTAL) BY NEBULIZATION EVERY 6 (SIX) HOURS AS NEEDED FOR WHEEZING OR SHORTNESS OF BREATH. 02/20/19  Yes Newlin, Enobong, MD  albuterol (VENTOLIN HFA) 108 (90 Base) MCG/ACT inhaler INHALE 2 PUFFS INTO THE LUNGS EVERY 6 (SIX) HOURS AS NEEDED FOR WHEEZING OR SHORTNESS OF BREATH. Patient taking differently: Inhale 2 puffs into the lungs every 6 (six) hours as needed for wheezing or shortness of breath.  05/18/19  Yes Newlin, Charlane Ferretti, MD  Fluticasone-Salmeterol (ADVAIR DISKUS) 250-50 MCG/DOSE AEPB Inhale 1 puff into the lungs 2 (two) times daily. 04/26/19  Yes Gildardo Pounds, NP  meclizine (ANTIVERT) 25 MG tablet Take 1 tablet (25 mg total) by mouth 2 (two) times daily as needed for up to 10 doses for dizziness or nausea. 09/01/19  Yes Curatolo, Adam, DO  cetirizine (ZYRTEC) 10 MG tablet Take 1 tablet (10 mg total) by mouth daily. Patient not taking: Reported on 09/07/2019 10/04/18   Elsie Stain, MD  cyclobenzaprine (FLEXERIL) 10 MG tablet Take 1 tablet (10 mg total) by mouth 2 (  two) times daily as needed for muscle spasms. Patient not taking: Reported on 09/07/2019 04/28/19   Dartha Lodge, PA-C  fluticasone [facility]) 50 MCG/ACT nasal spray Place 2 sprays into both nostrils daily. Patient not taking: Reported on 09/07/2019 10/04/18   Storm Frisk, MD  montelukast (SINGULAIR) 10 MG tablet Take 1 tablet (10 mg total) by mouth at bedtime. Patient not taking: Reported on 09/07/2019 10/03/18   Claiborne Rigg, NP  naproxen (NAPROSYN) 500 MG tablet Take 1 tablet (500 mg total) by mouth 2 (two) times daily. Patient not taking: Reported on 09/07/2019 04/28/19   Dartha Lodge, PA-C  pantoprazole (PROTONIX) 40 MG tablet Take 1 tablet (40 mg total) by mouth 2 (two) times daily before a meal. Patient not taking: Reported on 09/07/2019 10/04/18   Storm Frisk, MD  predniSONE (DELTASONE) 10 MG tablet Take 4 tablets daily for 5 days then stop Patient not taking: Reported on 09/07/2019 10/04/18   Storm Frisk, MD  traZODone (DESYREL) 100 MG tablet Take 1 tablet (100 mg total) by mouth at bedtime. 08/30/18 11/28/18  Claiborne Rigg, NP    Allergies    Oxycodone and Watermelon flavor  Review of Systems   Review of Systems  Constitutional:       Per HPI, otherwise negative  HENT:       Per HPI, otherwise negative  Respiratory:       Per HPI, otherwise negative  Cardiovascular:       Per HPI, otherwise negative  Gastrointestinal: Negative for vomiting.  Endocrine:       Negative aside from HPI  Genitourinary:       Neg aside from HPI   Musculoskeletal:       Per HPI, otherwise negative  Skin: Negative.   Neurological: Negative for syncope.    Physical Exam Updated Vital Signs BP (!) 153/93   Pulse 82   Temp (!) 97.5 F (36.4 C) (Oral)   Resp 11   SpO2 100%   Physical Exam Vitals and nursing note reviewed.  Constitutional:      Appearance: She is well-developed. She is obese.     Comments: Uncomfortable appearing obese adult female resting in left lateral decubitus position, awake, alert, speaking briefly, clearly.  HENT:     Head: Normocephalic and atraumatic.  Eyes:     Conjunctiva/sclera: Conjunctivae normal.  Cardiovascular:     Rate and Rhythm: Normal rate and regular rhythm.  Pulmonary:     Effort: Pulmonary effort is normal. No respiratory distress.     Breath sounds: Normal breath sounds. No stridor. No wheezing.  Abdominal:     General: There is no distension.  Skin:    General: Skin is warm and dry.  Neurological:     Mental Status: She is alert and oriented to person, place, and time.     Cranial Nerves: No cranial nerve  deficit.     Comments: Patient has no facial asymmetry, speech is clear, brief, appropriate.  She can move all extremities minimally, spontaneously, but states that it hurts to do so.     ED Results / Procedures / Treatments   Labs (all labs ordered are listed, but only abnormal results are displayed) Labs Reviewed  COMPREHENSIVE METABOLIC PANEL - Abnormal; Notable for the following components:      Result Value   CO2 20 (*)    Glucose, Bld 118 (*)    Total Protein 8.6 (*)    All other components  within normal limits  CBC WITH DIFFERENTIAL/PLATELET - Abnormal; Notable for the following components:   WBC 19.0 (*)    Neutro Abs 17.0 (*)    All other components within normal limits  URINALYSIS, ROUTINE W REFLEX MICROSCOPIC - Abnormal; Notable for the following components:   APPearance HAZY (*)    Ketones, ur 80 (*)    Protein, ur 30 (*)    Leukocytes,Ua TRACE (*)    Bacteria, UA MANY (*)    All other components within normal limits  CK - Abnormal; Notable for the following components:   Total CK 33 (*)    All other components within normal limits  RESPIRATORY PANEL BY RT PCR (FLU A&B, COVID)  LACTIC ACID, PLASMA  ETHANOL  RAPID URINE DRUG SCREEN, HOSP PERFORMED  C-REACTIVE PROTEIN  I-STAT BETA HCG BLOOD, ED ([facility], [facility], [facility] ONLY)    EKG None  Radiology CT HEAD WO CONTRAST  Result Date: 09/07/2019 CLINICAL DATA:  Headache, dizziness, vomiting, blurred vision, symptoms for 2 weeks EXAM: CT HEAD WITHOUT CONTRAST TECHNIQUE: Contiguous axial images were obtained from the base of the skull through the vertex without intravenous contrast. COMPARISON:  11/09/2015 FINDINGS: Brain: No acute infarct or hemorrhage. Lateral ventricles and midline structures are unremarkable. No acute extra-axial fluid collections. No mass effect. Vascular: No hyperdense vessel or unexpected calcification. Skull: Normal. Negative for fracture or focal lesion. Sinuses/Orbits: No acute finding. Other: None.  IMPRESSION: 1. Stable head CT, no acute process. Electronically Signed   By: Sharlet Salina M.D.   On: 09/07/2019 18:10   DG Chest Port 1 View  Result Date: 09/07/2019 CLINICAL DATA:  29 year old female with headache and dizziness. EXAM: PORTABLE CHEST 1 VIEW COMPARISON:  Chest radiograph dated 05/08/2017. FINDINGS: The heart size and mediastinal contours are within normal limits. Both lungs are clear. The visualized skeletal structures are unremarkable. IMPRESSION: No active disease. Electronically Signed   By: Elgie Collard M.D.   On: 09/07/2019 17:26    Procedures Procedures (including critical care time)  Medications Ordered in ED Medications  sodium chloride 0.9 % bolus 1,000 mL (0 mLs Intravenous Stopped 09/07/19 2029)    And  0.9 %  sodium chloride infusion ( Intravenous New Bag/Given (Non-Interop) 09/07/19 1939)  prochlorperazine (COMPAZINE) injection 10 mg (10 mg Intravenous Given 09/07/19 1710)    ED Course  I have reviewed the triage vital signs and the nursing notes.  Pertinent labs & imaging results that were available during my care of the patient were reviewed by me and considered in my medical decision making (see chart for details).  Patient's chart reviewed after the initial evaluation, at that point she had initial work-up which was generally reassuring, seemingly was better prior to discharge.  Update: Patient ambulatory to the bathroom, no apparent distress.  Update:, Patient speaking on the cellular telephone with her mother, in no distress, states that she feels better.  We discussed labs, which after some delay are now fully available. Labs generally reassuring, with most notable abnormality substantial ketonuria, likely contributing to the patient's weakness. She has not demonstrated ability to ambulate, moves all extremities spontaneously, speaks clearly, has no distress, there are some suspicion for dehydration contributing to today's presentation.  9:51  PM Patient again ambulatory.  Fluids complete, 2 L, normal saline. No distress, vital signs unremarkable - slight HTN noted.  This young female presents with headache, weakness, nausea, vomiting. Patient has no abdominal pain, is awake and alert, though complaining of discomfort initially.  However, this  seemingly improved substantially, to the point of her being ambulatory, speaking on the telephone without distress.  No nuchal rigidity, no evidence for meningitis, no focal neuro deficits, and reassuring head CT today. With suspicion for dehydration contributing to today's presentation she received fluids, and subsequently had substantial improvement.  Given this, though she does have mild leukocytosis, mild elevation in CK, these are likely inflammatory, and the patient has no obvious infection as above. Given her substantial improvement here, she was discharged with ongoing meds to follow-up with outpatient providers.   Final Clinical Impression(s) / ED Diagnoses Final diagnoses:  Weakness  Dehydration  Bad headache    Rx / DC Orders ED Discharge Orders         Ordered    prochlorperazine (COMPAZINE) 10 MG tablet  2 times daily PRN     09/07/19 2158           Gerhard Munch, MD 09/07/19 2159

## 2019-09-07 NOTE — ED Notes (Signed)
XR at bedside

## 2019-09-07 NOTE — ED Triage Notes (Signed)
Pt reports for 2 weeks had headache, dizziness, vomiting and blurred vision.

## 2019-09-07 NOTE — ED Notes (Signed)
Patient ambulatory to restroom with standby assistance.

## 2019-09-07 NOTE — Discharge Instructions (Signed)
As discussed, today's evaluation has been generally reassuring aside from demonstration of substantial dehydration.  It is important you stay well-hydrated and monitor your condition carefully.  Please be sure to call tomorrow for the next available follow-up appointment with your physician.  In the interim, return here for concerning changes in your condition.

## 2019-09-28 ENCOUNTER — Other Ambulatory Visit: Payer: Self-pay | Admitting: Physician Assistant

## 2019-09-28 ENCOUNTER — Ambulatory Visit: Payer: Self-pay | Attending: Nurse Practitioner | Admitting: Physician Assistant

## 2019-09-28 ENCOUNTER — Other Ambulatory Visit: Payer: Self-pay

## 2019-09-28 DIAGNOSIS — R110 Nausea: Secondary | ICD-10-CM

## 2019-09-28 DIAGNOSIS — J4520 Mild intermittent asthma, uncomplicated: Secondary | ICD-10-CM

## 2019-09-28 DIAGNOSIS — Z09 Encounter for follow-up examination after completed treatment for conditions other than malignant neoplasm: Secondary | ICD-10-CM

## 2019-09-28 DIAGNOSIS — F5101 Primary insomnia: Secondary | ICD-10-CM

## 2019-09-28 MED ORDER — TRAZODONE HCL 100 MG PO TABS: 100.0000 mg | ORAL_TABLET | Freq: Every day | ORAL | 2 refills | Status: DC

## 2019-09-28 MED ORDER — ALBUTEROL SULFATE HFA 108 (90 BASE) MCG/ACT IN AERS: 2.0000 | INHALATION_SPRAY | Freq: Four times a day (QID) | RESPIRATORY_TRACT | 2 refills | Status: DC | PRN

## 2019-09-28 MED ORDER — MONTELUKAST SODIUM 10 MG PO TABS: 10.0000 mg | ORAL_TABLET | Freq: Every day | ORAL | 3 refills | Status: DC

## 2019-09-28 MED ORDER — PROCHLORPERAZINE MALEATE 10 MG PO TABS: 10.0000 mg | ORAL_TABLET | Freq: Two times a day (BID) | ORAL | 0 refills | Status: DC | PRN

## 2019-09-28 MED ORDER — FLUTICASONE-SALMETEROL 250-50 MCG/DOSE IN AEPB: 1.0000 | INHALATION_SPRAY | Freq: Two times a day (BID) | RESPIRATORY_TRACT | 3 refills | Status: DC

## 2019-09-28 MED ORDER — CETIRIZINE HCL 10 MG PO TABS: 10.0000 mg | ORAL_TABLET | Freq: Every day | ORAL | 2 refills | Status: DC

## 2019-09-28 MED ORDER — ALBUTEROL SULFATE (2.5 MG/3ML) 0.083% IN NEBU: 2.5000 mg | INHALATION_SOLUTION | Freq: Four times a day (QID) | RESPIRATORY_TRACT | 0 refills | Status: DC | PRN

## 2019-09-28 NOTE — Progress Notes (Signed)
Patient ID: Paula Wiggins, female   DOB: [birth year], 29 y.o.   MRN: [id number] Virtual Visit via Telephone Note  I connected with Paula Wiggins on 09/28/19 at  3:30 PM EDT by telephone and verified that I am speaking with the correct person using two identifiers.   I discussed the limitations, risks, security and privacy concerns of performing an evaluation and management service by telephone and the availability of in person appointments. I also discussed with the patient that there may be a patient responsible charge related to this service. The patient expressed understanding and agreed to proceed.   PATIENT visit by telephone virtually in the context of Covid-19 pandemic. Patient location: My Location:  [facility] office Persons on the call:    History of Present Illness:  Patient seen in ED multiple times from 4/16-4/22 for weakness, HA, body aches, SOB.  Still having mild HA.  Appetite is decreased but she is eating and drinking ok.  SOB has improved but getting low on inhalers and needs RF on all her meds.  No fevers.  Energy level is improving.  Occasional nausea;  No vomiting.  No diarrhea.  No melena/hematochezia.    Observations/Objective:  NAD.  A&Ox3   Assessment and Plan: 1. Mild intermittent asthma in adult without complication stable - albuterol (VENTOLIN HFA) 108 (90 Base) MCG/ACT inhaler; Inhale 2 puffs into the lungs every 6 (six) hours as needed for wheezing or shortness of breath.  Dispense: 18 g; Refill: 2 - albuterol (PROVENTIL) (2.5 MG/3ML) 0.083% nebulizer solution; Take 3 mLs (2.5 mg total) by nebulization every 6 (six) hours as needed for wheezing or shortness of breath.  Dispense: 90 mL; Refill: 0 - Fluticasone-Salmeterol (ADVAIR DISKUS) 250-50 MCG/DOSE AEPB; Inhale 1 puff into the lungs 2 (two) times daily.  Dispense: 1 each; Refill: 3 - montelukast (SINGULAIR) 10 MG tablet; Take 1 tablet (10 mg total) by mouth at bedtime.  Dispense: 90 tablet; Refill: 3 -  cetirizine (ZYRTEC) 10 MG tablet; Take 1 tablet (10 mg total) by mouth daily.  Dispense: 30 tablet; Refill: 2  2. Primary insomnia - traZODone (DESYREL) 100 MG tablet; Take 1 tablet (100 mg total) by mouth at bedtime.  Dispense: 90 tablet; Refill: 2  3. Encounter for examination following treatment at hospital improving  4. Nausea comapzine RF    Follow Up Instructions: See PCP in 2 months;  Sooner if needed   I discussed the assessment and treatment plan with the patient. The patient was provided an opportunity to ask questions and all were answered. The patient agreed with the plan and demonstrated an understanding of the instructions.   The patient was advised to call back or seek an in-person evaluation if the symptoms worsen or if the condition fails to improve as anticipated.  I provided 12 minutes of non-face-to-face time during this encounter.   Georgian Co, PA-C

## 2019-12-08 ENCOUNTER — Other Ambulatory Visit: Payer: Self-pay | Admitting: Nurse Practitioner

## 2019-12-08 DIAGNOSIS — J4520 Mild intermittent asthma, uncomplicated: Secondary | ICD-10-CM

## 2019-12-08 DIAGNOSIS — F5101 Primary insomnia: Secondary | ICD-10-CM

## 2019-12-08 MED ORDER — TRAZODONE HCL 100 MG PO TABS: 100.0000 mg | ORAL_TABLET | Freq: Every day | ORAL | 2 refills | Status: DC

## 2019-12-08 MED ORDER — ALBUTEROL SULFATE (2.5 MG/3ML) 0.083% IN NEBU: 2.5000 mg | INHALATION_SOLUTION | Freq: Four times a day (QID) | RESPIRATORY_TRACT | 0 refills | Status: DC | PRN

## 2019-12-08 MED ORDER — ALBUTEROL SULFATE HFA 108 (90 BASE) MCG/ACT IN AERS: 2.0000 | INHALATION_SPRAY | Freq: Four times a day (QID) | RESPIRATORY_TRACT | 2 refills | Status: DC | PRN

## 2019-12-08 MED ORDER — FLUTICASONE-SALMETEROL 250-50 MCG/DOSE IN AEPB: 1.0000 | INHALATION_SPRAY | Freq: Two times a day (BID) | RESPIRATORY_TRACT | 3 refills | Status: DC

## 2019-12-08 NOTE — Telephone Encounter (Signed)
Medication: albuterol (PROVENTIL) (2.5 MG/3ML) 0.083% nebulizer solution [[id number]] , albuterol (VENTOLIN HFA) 108 (90 Base) MCG/ACT inhaler [[id number]] ,  Fluticasone-Salmeterol (ADVAIR DISKUS) 250-50 MCG/DOSE AEPB [[id number]] , traZODone (DESYREL) 100 MG tablet [[id number]]   Has the patient contacted their pharmacy? YES (Agent: If no, request that the patient contact the pharmacy for the refill.) (Agent: If yes, when and what did the pharmacy advise?)     Preferred Pharmacy (with phone number or street name): [facility] - [place], [place] - [address]  Phone:  [phone] Fax:  [phone]  Agent: Please be advised that RX refills may take up to 3 business days. We ask that you follow-up with your pharmacy.

## 2020-02-08 ENCOUNTER — Telehealth: Payer: Self-pay | Admitting: Nurse Practitioner

## 2020-02-08 DIAGNOSIS — J4520 Mild intermittent asthma, uncomplicated: Secondary | ICD-10-CM

## 2020-02-08 NOTE — Telephone Encounter (Signed)
Patient requesting Advair inhaler , albuterol (PROVENTIL) (2.5 MG/3ML) 0.083% nebulizer solution. albuterol (VENTOLIN HFA) 108 (90 Base) MCG/ACT inhaler. Informed please allow 48 to 72 hour turn around time   [facility] - [place], [place] - [place] [address] [employer] [address] Phone:  [phone]  Fax:  [phone]

## 2020-02-09 ENCOUNTER — Other Ambulatory Visit: Payer: Self-pay | Admitting: Nurse Practitioner

## 2020-02-09 DIAGNOSIS — J4520 Mild intermittent asthma, uncomplicated: Secondary | ICD-10-CM

## 2020-02-10 NOTE — Telephone Encounter (Signed)
Requested  medications are  due for refill today yes  Requested medications are on the active medication list yes  Last refill 8/6  Last visit 09/2019  Future visit scheduled no  Notes to clinic ? Duplicate meds?

## 2020-02-13 ENCOUNTER — Other Ambulatory Visit: Payer: Self-pay | Admitting: Family Medicine

## 2020-05-23 ENCOUNTER — Other Ambulatory Visit: Payer: Self-pay | Admitting: Family Medicine

## 2020-05-23 DIAGNOSIS — J4520 Mild intermittent asthma, uncomplicated: Secondary | ICD-10-CM

## 2020-05-24 ENCOUNTER — Other Ambulatory Visit: Payer: Self-pay | Admitting: Nurse Practitioner

## 2020-05-27 ENCOUNTER — Other Ambulatory Visit: Payer: Self-pay | Admitting: Nurse Practitioner

## 2020-05-27 ENCOUNTER — Encounter: Payer: Self-pay | Admitting: Nurse Practitioner

## 2020-05-27 ENCOUNTER — Other Ambulatory Visit: Payer: Self-pay

## 2020-05-27 ENCOUNTER — Ambulatory Visit: Payer: Self-pay | Attending: Nurse Practitioner | Admitting: Nurse Practitioner

## 2020-05-27 VITALS — BP 104/70 | HR 99 | Temp 99.2°F | Ht 64.0 in | Wt 238.0 lb

## 2020-05-27 DIAGNOSIS — M25511 Pain in right shoulder: Secondary | ICD-10-CM

## 2020-05-27 DIAGNOSIS — Z0000 Encounter for general adult medical examination without abnormal findings: Secondary | ICD-10-CM

## 2020-05-27 DIAGNOSIS — R7303 Prediabetes: Secondary | ICD-10-CM

## 2020-05-27 DIAGNOSIS — J4520 Mild intermittent asthma, uncomplicated: Secondary | ICD-10-CM

## 2020-05-27 DIAGNOSIS — D72829 Elevated white blood cell count, unspecified: Secondary | ICD-10-CM

## 2020-05-27 MED ORDER — BUDESONIDE-FORMOTEROL FUMARATE 160-4.5 MCG/ACT IN AERO: 2.0000 | INHALATION_SPRAY | Freq: Two times a day (BID) | RESPIRATORY_TRACT | 6 refills | Status: DC

## 2020-05-27 MED ORDER — NAPROXEN 500 MG PO TABS: 500.0000 mg | ORAL_TABLET | Freq: Two times a day (BID) | ORAL | 3 refills | Status: DC

## 2020-05-27 MED ORDER — METHOCARBAMOL 750 MG PO TABS: 750.0000 mg | ORAL_TABLET | Freq: Three times a day (TID) | ORAL | 1 refills | Status: DC | PRN

## 2020-05-27 NOTE — Progress Notes (Signed)
Assessment & Plan:  Paula Wiggins was seen today for annual exam.  Diagnoses and all orders for this visit:  Encounter for annual physical exam  Mild intermittent asthma in adult without complication -     budesonide-formoterol (SYMBICORT) 160-4.5 MCG/ACT inhaler; Inhale 2 puffs into the lungs 2 (two) times daily. NEEDS PASS  Leukocytosis, unspecified type -     CBC with Differential -     CMP14+EGFR  Prediabetes -     Hemoglobin A1c  Acute pain of right shoulder -     naproxen (NAPROSYN) 500 MG tablet; Take 1 tablet (500 mg total) by mouth 2 (two) times daily. -     methocarbamol (ROBAXIN) 750 MG tablet; Take 1 tablet (750 mg total) by mouth every 8 (eight) hours as needed for muscle spasms. May alternate with heat and ice application for pain relief. May also alternate with acetaminophen as prescribed pain relief. Other alternatives include massage, acupuncture and water aerobics.  You must stay active and avoid a sedentary lifestyle.   Patient has been counseled on age-appropriate routine health concerns for screening and prevention. These are reviewed and up-to-date. Referrals have been placed accordingly. Immunizations are up-to-date or declined.    Subjective:   Chief Complaint  Patient presents with  . Annual Exam    Pt. Is here for physical.    HPI Paula Wiggins 30 y.o. female presents to office today for annual physical. She declines PAP smear today. She has complaints today of right shoulder pain.   Right shoulder pain  Onset 3  Weeks ago. Inciting event: A large box fell at work and she tried to catch it with her hands. Pain 8/10.   She has had pain in her right shoulder in the past as well.  A few years she tried to lift a stump from the ground and immediately felt something pop in her shoulder but was never evaluated for this. Currently she is unable to lift her right arm above shoulder height.  Asthma States her advair inhaler has not been working as well as it  used to in the past. She has tried symbicort in the past which provided significant relief of her shortness of breath. She is also taking singulair every night as prescribed. She has had a sleep study performed. Negative for OSA.    Review of Systems  Constitutional: Negative for fever, malaise/fatigue and weight loss.  HENT: Negative.  Negative for nosebleeds.   Eyes: Negative.  Negative for blurred vision, double vision and photophobia.  Respiratory: Positive for shortness of breath and wheezing. Negative for cough.   Cardiovascular: Negative.  Negative for chest pain, palpitations and leg swelling.  Gastrointestinal: Negative.  Negative for heartburn, nausea and vomiting.  Musculoskeletal: Positive for joint pain. Negative for myalgias.  Neurological: Negative.  Negative for dizziness, focal weakness, seizures and headaches.  Psychiatric/Behavioral: Negative.  Negative for suicidal ideas.    Past Medical History:  Diagnosis Date  . Asthma     History reviewed. No pertinent surgical history.  Family History  Problem Relation Age of Onset  . Hypertension Mother   . Asthma Mother   . Seizures Father   . Hypertension Father   . Congestive Heart Failure Paternal Grandfather   . Asthma Sister   . Emphysema Maternal Grandmother   . Asthma Sister   . Multiple sclerosis Paternal Aunt     Social History Reviewed with no changes to be made today.   Outpatient Medications Prior to Visit  Medication  Sig Dispense Refill  . albuterol (PROVENTIL) (2.5 MG/3ML) 0.083% nebulizer solution TAKE 3 MLS (2.5 MG TOTAL) BY NEBULIZATION EVERY 6 (SIX) HOURS AS NEEDED FOR WHEEZING OR SHORTNESS OF BREATH. 90 mL 0  . albuterol (VENTOLIN HFA) 108 (90 Base) MCG/ACT inhaler Inhale 2 puffs into the lungs every 6 (six) hours as needed for wheezing or shortness of breath. 18 g 2  . cetirizine (ZYRTEC) 10 MG tablet Take 1 tablet (10 mg total) by mouth daily. 30 tablet 2  . fluticasone (FLONASE) 50 MCG/ACT  nasal spray Place 2 sprays into both nostrils daily. 16 g 6  . meclizine (ANTIVERT) 25 MG tablet Take 1 tablet (25 mg total) by mouth 2 (two) times daily as needed for up to 10 doses for dizziness or nausea. 10 tablet 0  . montelukast (SINGULAIR) 10 MG tablet Take 1 tablet (10 mg total) by mouth at bedtime. 90 tablet 3  . prochlorperazine (COMPAZINE) 10 MG tablet Take 1 tablet (10 mg total) by mouth 2 (two) times daily as needed for nausea or vomiting. 10 tablet 0  . Fluticasone-Salmeterol (ADVAIR DISKUS) 250-50 MCG/DOSE AEPB Inhale 1 puff into the lungs 2 (two) times daily. 1 each 3  . traZODone (DESYREL) 100 MG tablet Take 1 tablet (100 mg total) by mouth at bedtime. 90 tablet 2  . cyclobenzaprine (FLEXERIL) 10 MG tablet Take 1 tablet (10 mg total) by mouth 2 (two) times daily as needed for muscle spasms. (Patient not taking: No sig reported) 20 tablet 0  . naproxen (NAPROSYN) 500 MG tablet Take 1 tablet (500 mg total) by mouth 2 (two) times daily. (Patient not taking: No sig reported) 30 tablet 0  . pantoprazole (PROTONIX) 40 MG tablet Take 1 tablet (40 mg total) by mouth 2 (two) times daily before a meal. (Patient not taking: No sig reported) 60 tablet 1   No facility-administered medications prior to visit.    Allergies  Allergen Reactions  . Oxycodone Anaphylaxis, Shortness Of Breath and Swelling  . Shrimp Flavor   . Watermelon Flavor Swelling and Other (See Comments)    Swelling of tongue and lips break out       Objective:    BP 104/70 (BP Location: Left Arm, Patient Position: Sitting, Cuff Size: Large)   Pulse 99   Temp 99.2 F (37.3 C) (Oral)   Ht '5\' 4"'  (1.626 m)   Wt 238 lb (108 kg)   LMP 05/07/2020   SpO2 99%   BMI 40.85 kg/m  Wt Readings from Last 3 Encounters:  05/27/20 238 lb (108 kg)  09/04/19 230 lb (104.3 kg)  04/28/19 248 lb (112.5 kg)    Physical Exam Constitutional:      Appearance: She is well-developed and well-nourished.  HENT:     Head:  Normocephalic and atraumatic.     Right Ear: External ear normal.     Left Ear: External ear normal.     Nose: Nose normal.     Mouth/Throat:     Mouth: Oropharynx is clear and moist.     Pharynx: No oropharyngeal exudate.  Eyes:     General: No scleral icterus.       Right eye: No discharge.     Extraocular Movements: EOM normal.     Conjunctiva/sclera: Conjunctivae normal.     Pupils: Pupils are equal, round, and reactive to light.  Neck:     Thyroid: No thyromegaly.     Trachea: No tracheal deviation.  Cardiovascular:     Rate  and Rhythm: Normal rate and regular rhythm.     Pulses: Intact distal pulses.     Heart sounds: Normal heart sounds. No murmur heard. No friction rub.  Pulmonary:     Effort: Pulmonary effort is normal. No accessory muscle usage or respiratory distress.     Breath sounds: Normal breath sounds. No decreased breath sounds, wheezing, rhonchi or rales.  Chest:     Chest wall: No tenderness.  Breasts: Breasts are symmetrical.     Right: No inverted nipple, mass, nipple discharge, skin change or tenderness.     Left: No inverted nipple, mass, nipple discharge, skin change or tenderness.    Abdominal:     General: Bowel sounds are normal. There is no distension.     Palpations: Abdomen is soft. There is no mass.     Tenderness: There is no abdominal tenderness. There is no guarding or rebound.  Musculoskeletal:        General: Tenderness present. No swelling, deformity or edema.     Right shoulder: Tenderness present. Decreased range of motion.     Cervical back: Normal range of motion and neck supple.  Lymphadenopathy:     Cervical: No cervical adenopathy.  Skin:    General: Skin is warm and dry.     Findings: No erythema.  Neurological:     Mental Status: She is alert and oriented to person, place, and time.     Cranial Nerves: No cranial nerve deficit.     Coordination: Coordination normal.     Deep Tendon Reflexes: Reflexes are normal and  symmetric.  Psychiatric:        Mood and Affect: Mood and affect normal.        Speech: Speech normal.        Behavior: Behavior normal.        Thought Content: Thought content normal.        Judgment: Judgment normal.          Patient has been counseled extensively about nutrition and exercise as well as the importance of adherence with medications and regular follow-up. The patient was given clear instructions to go to ER or return to medical center if symptoms don't improve, worsen or new problems develop. The patient verbalized understanding.   Follow-up: Return in about 2 weeks (around 06/10/2020) for f/u asthma/left shoulder pain.   Gildardo Pounds, FNP-BC [facility] and [facility], [facility]   05/27/2020, 8:42 PM

## 2020-05-31 ENCOUNTER — Ambulatory Visit: Payer: Self-pay

## 2020-06-18 ENCOUNTER — Other Ambulatory Visit (HOSPITAL_COMMUNITY): Payer: Self-pay | Admitting: Emergency Medicine

## 2020-06-18 ENCOUNTER — Encounter (HOSPITAL_COMMUNITY): Payer: Self-pay

## 2020-06-18 ENCOUNTER — Other Ambulatory Visit: Payer: Self-pay

## 2020-06-18 ENCOUNTER — Emergency Department (HOSPITAL_COMMUNITY)
Admission: EM | Admit: 2020-06-18 | Discharge: 2020-06-18 | Disposition: A | Discharge status: Home / Self Care | Payer: Self-pay | Attending: Emergency Medicine | Admitting: Emergency Medicine

## 2020-06-18 ENCOUNTER — Emergency Department (HOSPITAL_COMMUNITY): Payer: Self-pay

## 2020-06-18 DIAGNOSIS — J4551 Severe persistent asthma with (acute) exacerbation: Secondary | ICD-10-CM | POA: Insufficient documentation

## 2020-06-18 DIAGNOSIS — J4520 Mild intermittent asthma, uncomplicated: Secondary | ICD-10-CM

## 2020-06-18 DIAGNOSIS — Z87891 Personal history of nicotine dependence: Secondary | ICD-10-CM | POA: Insufficient documentation

## 2020-06-18 DIAGNOSIS — R52 Pain, unspecified: Secondary | ICD-10-CM

## 2020-06-18 DIAGNOSIS — Z7951 Long term (current) use of inhaled steroids: Secondary | ICD-10-CM | POA: Insufficient documentation

## 2020-06-18 MED ORDER — PREDNISONE 20 MG PO TABS: 40.0000 mg | ORAL_TABLET | Freq: Every day | ORAL | 0 refills | Status: AC

## 2020-06-18 MED ORDER — ALBUTEROL SULFATE (2.5 MG/3ML) 0.083% IN NEBU: 2.5000 mg | INHALATION_SOLUTION | Freq: Four times a day (QID) | RESPIRATORY_TRACT | 0 refills | Status: DC | PRN

## 2020-06-18 MED ORDER — AEROCHAMBER PLUS FLO-VU MEDIUM MISC
1.0000 | Freq: Once | Status: AC
  Administered 2020-06-18: 1
  Filled 2020-06-18 (×2): qty 1

## 2020-06-18 MED ORDER — ALBUTEROL SULFATE HFA 108 (90 BASE) MCG/ACT IN AERS
2.0000 | INHALATION_SPRAY | Freq: Once | RESPIRATORY_TRACT | Status: AC
  Administered 2020-06-18: 2 via RESPIRATORY_TRACT
  Filled 2020-06-18: qty 6.7

## 2020-06-18 MED ORDER — PREDNISONE 20 MG PO TABS
40.0000 mg | ORAL_TABLET | Freq: Once | ORAL | Status: AC
  Administered 2020-06-18: 40 mg via ORAL
  Filled 2020-06-18: qty 2

## 2020-06-18 NOTE — ED Provider Notes (Signed)
[facility]-EMERGENCY DEPT Provider Note   CSN: [id number] Arrival date & time: 06/18/20  1351     History Chief Complaint  Patient presents with  . Asthma    Paula Wiggins is a 30 y.o. female history of asthma.  Patient presents with chief complaint of asthma exacerbations, present for the last 2 weeks, associated with wheezing and shortness of breath, minimal relief from albuterol inhaler, worse with cough or sneezing.  She reports that she has been using her albuterol inhaler approximately 10 times a day.  She has run out of her albuterol inhaler and albuterol nebulizer treatment.  Patient denies any fevers, chills, cough, rhinorrhea, nasal congestion, sore throat, nausea vomiting, or diarrhea, abdominal pain, lower extremity swelling or tenderness.  Patient reports that she was tested for COVID-19 yesterday at her job and was negative.  Patient reports she has not been vaccinated for COVID-19.  Per chart review patient was seen by her primary care provider on 05/27/20.  Patient was prescribed Symbicort for her symptoms however she states she cannot afford this medication.  Patient reports that we will be following up with pulmonologist however has been unable to set an appointment.    HPI     Past Medical History:  Diagnosis Date  . Asthma     Patient Active Problem List   Diagnosis Date Noted  . SOB (shortness of breath) 11/26/2018  . GERD (gastroesophageal reflux disease) 03/10/2017  . Mold exposure 03/10/2017  . Asthma, severe persistent 02/13/2017    History reviewed. No pertinent surgical history.   OB History   No obstetric history on file.     Family History  Problem Relation Age of Onset  . Hypertension Mother   . Asthma Mother   . Seizures Father   . Hypertension Father   . Congestive Heart Failure Paternal Grandfather   . Asthma Sister   . Emphysema Maternal Grandmother   . Asthma Sister   . Multiple sclerosis Paternal Aunt      Social History   Tobacco Use  . Smoking status: Former Smoker    Types: Cigarettes  . Smokeless tobacco: Never Used  . Tobacco comment: Smoked for 1 month total.   Vaping Use  . Vaping Use: Never used  Substance Use Topics  . Alcohol use: Not Currently  . Drug use: No    Home Medications Prior to Admission medications   Medication Sig Start Date End Date Taking? Authorizing Provider  albuterol (PROVENTIL) (2.5 MG/3ML) 0.083% nebulizer solution TAKE 3 MLS (2.5 MG TOTAL) BY NEBULIZATION EVERY 6 (SIX) HOURS AS NEEDED FOR WHEEZING OR SHORTNESS OF BREATH. 05/24/20   Claiborne Rigg, NP  albuterol (VENTOLIN HFA) 108 (90 Base) MCG/ACT inhaler Inhale 2 puffs into the lungs every 6 (six) hours as needed for wheezing or shortness of breath. 12/08/19   Claiborne Rigg, NP  budesonide-formoterol (SYMBICORT) 160-4.5 MCG/ACT inhaler Inhale 2 puffs into the lungs 2 (two) times daily. NEEDS PASS 05/27/20   Claiborne Rigg, NP  cetirizine (ZYRTEC) 10 MG tablet Take 1 tablet (10 mg total) by mouth daily. 09/28/19   Anders Simmonds, PA-C  fluticasone (FLONASE) 50 MCG/ACT nasal spray Place 2 sprays into both nostrils daily. 10/04/18   Storm Frisk, MD  meclizine (ANTIVERT) 25 MG tablet Take 1 tablet (25 mg total) by mouth 2 (two) times daily as needed for up to 10 doses for dizziness or nausea. 09/01/19   Curatolo, Adam, DO  methocarbamol (ROBAXIN) 750  MG tablet Take 1 tablet (750 mg total) by mouth every 8 (eight) hours as needed for muscle spasms. 05/27/20   Claiborne Rigg, NP  montelukast (SINGULAIR) 10 MG tablet Take 1 tablet (10 mg total) by mouth at bedtime. 09/28/19   Anders Simmonds, PA-C  naproxen (NAPROSYN) 500 MG tablet Take 1 tablet (500 mg total) by mouth 2 (two) times daily. 05/27/20 06/26/20  Claiborne Rigg, NP  prochlorperazine (COMPAZINE) 10 MG tablet Take 1 tablet (10 mg total) by mouth 2 (two) times daily as needed for nausea or vomiting. 09/28/19   Anders Simmonds, PA-C   traZODone (DESYREL) 100 MG tablet Take 1 tablet (100 mg total) by mouth at bedtime. 12/08/19 03/07/20  Claiborne Rigg, NP    Allergies    Oxycodone, Shrimp flavor, and Watermelon flavor  Review of Systems   Review of Systems  Constitutional: Negative for chills and fever.  HENT: Negative for congestion, rhinorrhea, sneezing and sore throat.   Respiratory: Positive for shortness of breath and wheezing. Negative for cough and stridor.   Cardiovascular: Negative for chest pain and leg swelling.  Gastrointestinal: Negative for abdominal pain, diarrhea, nausea and vomiting.  Musculoskeletal: Negative for back pain and neck pain.    Physical Exam Updated Vital Signs BP 125/87 (BP Location: Right Arm)   Pulse (!) 104   Temp 98.4 F (36.9 C) (Oral)   Resp (!) 24   SpO2 97%   Physical Exam Vitals and nursing note reviewed.  Constitutional:      General: She is not in acute distress.    Appearance: She is not ill-appearing, toxic-appearing or diaphoretic.  HENT:     Head: Normocephalic.  Eyes:     General: No scleral icterus.       Right eye: No discharge.        Left eye: No discharge.  Cardiovascular:     Rate and Rhythm: Normal rate.  Pulmonary:     Effort: Pulmonary effort is normal. No tachypnea, bradypnea, accessory muscle usage, prolonged expiration or respiratory distress.     Breath sounds: No stridor. Examination of the left-lower field reveals wheezing. Wheezing present. No rhonchi or rales.     Comments: Patient able to speak in full complete sentences without difficulty Musculoskeletal:     Comments: No edema or swelling to lower extremities.  No tenderness to lower extremities  Skin:    General: Skin is warm and dry.  Neurological:     General: No focal deficit present.     Mental Status: She is alert.  Psychiatric:        Behavior: Behavior is cooperative.     ED Results / Procedures / Treatments   Labs (all labs ordered are listed, but only abnormal  results are displayed) Labs Reviewed - No data to display  EKG None  Radiology DG Chest 2 View  Result Date: 06/18/2020 CLINICAL DATA:  Chest pain. EXAM: CHEST - 2 VIEW COMPARISON:  Chest x-ray dated September 07, 2019. FINDINGS: The heart size and mediastinal contours are within normal limits. Both lungs are clear. The visualized skeletal structures are unremarkable. IMPRESSION: No active cardiopulmonary disease. Electronically Signed   By: Obie Dredge M.D.   On: 06/18/2020 16:50    Procedures Procedures   Medications Ordered in ED Medications  predniSONE (DELTASONE) tablet 40 mg (40 mg Oral Given 06/18/20 1551)  albuterol (VENTOLIN HFA) 108 (90 Base) MCG/ACT inhaler 2 puff (2 puffs Inhalation Given 06/18/20 1552)  AeroChamber Plus  Flo-Vu Medium MISC 1 each (1 each Other Given 06/18/20 1552)    ED Course  I have reviewed the triage vital signs and the nursing notes.  Pertinent labs & imaging results that were available during my care of the patient were reviewed by me and considered in my medical decision making (see chart for details).    MDM Rules/Calculators/A&P                          Alert 30 year old female in no acute distress, nontoxic-appearing.  Patient presents with chief complaint of asthma exacerbation.  Patient reports that she has run out of her albuterol inhaler and albuterol nebulizer treatment.  Patient prescribed Symbicort however has not been taking due to cost.    Patient able to speak in full complete sentences without difficulty.  Patient has wheezing noted to left lower lobe.  Will give patient albuterol inhaler, prednisone and obtain a chest x-ray.  Patient reports that she was tested for COVID-19 yesterday at her job and was negative.  Patient has no cough, fever, chills, congestion, sore throat, myalgias, nausea, vomiting or diarrhea.  Was concerning for COVID-19, influenza or other URI.  Patient denies any hemoptysis, recent surgery, hormone use, no PE or  DVT history.  Oxygen saturation 97% or greater.  No persistent tachycardia.  Less concerning for PE.  Denies any COVID-19 vaccination.  Recommended patient get vaccinated due to her asthma and obesity.  If chest x-ray is unremarkable will discharge patient with albuterol inhaler, albuterol nebulizer treatment and prednisone.  Will have patient follow-up with her primary care provider for further management of her asthma.     Final Clinical Impression(s) / ED Diagnoses Final diagnoses:  Severe persistent asthma with exacerbation    Rx / DC Orders ED Discharge Orders         Ordered    predniSONE (DELTASONE) 20 MG tablet  Daily        06/18/20 1708    albuterol (PROVENTIL) (2.5 MG/3ML) 0.083% nebulizer solution  Every 6 hours PRN       Note to Pharmacy: Will need office visit prior to further refills.   06/18/20 1708           Haskel Schroeder, PA-C 06/18/20 Julian Reil    Alvira Monday, MD 06/19/20 1229

## 2020-06-18 NOTE — Discharge Instructions (Addendum)
You came to the emergency room today to be evaluated for your asthma.  Your x-ray showed no signs of pneumonia.  Based on your history and physical it is unlikely that you have 19, influenza or another upper respiratory infection.  I have prescribed you albuterol inhaler, albuterol nebulizer treatments, and prednisone.  Take these medications as prescribed.  Please follow-up with your primary care provider for further management of your asthma.  I have given you a prescription for steroids today.  Some common side effects include feelings of extra energy, feeling warm, increased appetite, and stomach upset.  If you are diabetic your sugars may run higher than usual.   Please return to the emergency department if: You are getting worse and do not respond to treatment during an asthma attack. You are short of breath when at rest or when doing very little physical activity. You have difficulty eating, drinking, or talking. You have chest pain or tightness. You develop a fast heartbeat or palpitations. You have a bluish color to your lips or fingernails. You are light-headed or dizzy, or you faint. Your peak flow reading is less than 50% of your personal best. You feel too tired to breathe normally.

## 2020-06-18 NOTE — ED Triage Notes (Signed)
Pt arrived via walk in, states she feels her medications (inhalers and nebulizer's) are not working as effectively. States she also pulled muscle while lifting heavy objects at work yesterday. Also needing prescription refills for nebulizer.

## 2020-06-21 ENCOUNTER — Ambulatory Visit: Payer: Self-pay | Attending: Nurse Practitioner | Admitting: Nurse Practitioner

## 2020-08-17 ENCOUNTER — Other Ambulatory Visit: Payer: Self-pay

## 2020-08-18 ENCOUNTER — Other Ambulatory Visit: Payer: Self-pay

## 2020-10-01 ENCOUNTER — Other Ambulatory Visit: Payer: Self-pay

## 2020-10-01 ENCOUNTER — Other Ambulatory Visit: Payer: Self-pay | Admitting: Nurse Practitioner

## 2020-10-01 DIAGNOSIS — J4520 Mild intermittent asthma, uncomplicated: Secondary | ICD-10-CM

## 2020-10-01 MED ORDER — ALBUTEROL SULFATE HFA 108 (90 BASE) MCG/ACT IN AERS
2.0000 | INHALATION_SPRAY | Freq: Four times a day (QID) | RESPIRATORY_TRACT | 0 refills | Status: DC | PRN
  Filled 2020-10-01: qty 18, 25d supply, fill #0

## 2020-10-01 MED ORDER — ALBUTEROL SULFATE (2.5 MG/3ML) 0.083% IN NEBU
3.0000 mL | INHALATION_SOLUTION | Freq: Four times a day (QID) | RESPIRATORY_TRACT | 0 refills | Status: DC | PRN
  Filled 2020-10-01: qty 90, 8d supply, fill #0

## 2020-10-01 MED FILL — Budesonide-Formoterol Fumarate Dihyd Aerosol 160-4.5 MCG/ACT: RESPIRATORY_TRACT | 25 days supply | Qty: 10.2 | Fill #0 | Status: AC

## 2020-10-01 MED FILL — Trazodone HCl Tab 100 MG: ORAL | 30 days supply | Qty: 30 | Fill #0 | Status: AC

## 2020-10-01 NOTE — Telephone Encounter (Signed)
  Notes to clinic: medication filled by a different provider  Review for continued use and refill    Requested Prescriptions  Pending Prescriptions Disp Refills   albuterol (PROVENTIL) (2.5 MG/3ML) 0.083% nebulizer solution 90 mL 0    Sig: TAKE 3 MLS (2.5 MG TOTAL) BY NEBULIZATION EVERY 6 (SIX) HOURS AS NEEDED FOR WHEEZING OR SHORTNESS OF BREATH.      Pulmonology:  Beta Agonists Failed - 10/01/2020 11:43 AM      Failed - One inhaler should last at least one month. If the patient is requesting refills earlier, contact the patient to check for uncontrolled symptoms.      Passed - Valid encounter within last 12 months    Recent Outpatient Visits           4 months ago Encounter for annual physical exam   [facility] [place], Shea Stakes, NP   1 year ago Encounter for examination following treatment at hospital   [facility] [place], [place], [place]   1 year ago Severe persistent asthma in adult without complication   [facility] Storm Frisk, MD   1 year ago Gastroesophageal reflux disease, esophagitis presence not specified   [facility] [place], [place] W, NP   2 years ago Mild intermittent asthma in adult without complication   [facility] [facility] [place], [place] W, NP                  albuterol (VENTOLIN HFA) 108 (90 Base) MCG/ACT inhaler 18 g 2    Sig: INHALE 2 PUFFS INTO THE LUNGS EVERY 6 (SIX) HOURS AS NEEDED FOR WHEEZING OR SHORTNESS OF BREATH.      Pulmonology:  Beta Agonists Failed - 10/01/2020 11:43 AM      Failed - One inhaler should last at least one month. If the patient is requesting refills earlier, contact the patient to check for uncontrolled symptoms.      Passed - Valid encounter within last 12 months    Recent Outpatient Visits           4 months ago Encounter for annual physical exam   [facility] [place], Shea Stakes, NP   1 year ago Encounter for examination following treatment at hospital   [facility] [place], [place], [place]   1 year ago Severe persistent asthma in adult without complication   [facility] Storm Frisk, MD   1 year ago Gastroesophageal reflux disease, esophagitis presence not specified   [facility] [place], [place] W, NP   2 years ago Mild intermittent asthma in adult without complication   [facility] [place], Shea Stakes, NP

## 2020-10-02 ENCOUNTER — Other Ambulatory Visit: Payer: Self-pay

## 2020-10-09 ENCOUNTER — Other Ambulatory Visit: Payer: Self-pay

## 2020-10-22 ENCOUNTER — Ambulatory Visit: Payer: Self-pay | Admitting: *Deleted
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
## 2020-10-23 NOTE — Telephone Encounter (Signed)
FYI

## 2020-11-14 ENCOUNTER — Other Ambulatory Visit: Payer: Self-pay

## 2020-11-14 ENCOUNTER — Emergency Department (HOSPITAL_COMMUNITY)
Admission: EM | Admit: 2020-11-14 | Discharge: 2020-11-14 | Disposition: A | Discharge status: Home / Self Care | Payer: Self-pay | Attending: Emergency Medicine | Admitting: Emergency Medicine

## 2020-11-14 ENCOUNTER — Emergency Department (HOSPITAL_COMMUNITY): Payer: Self-pay

## 2020-11-14 ENCOUNTER — Encounter (HOSPITAL_COMMUNITY): Payer: Self-pay

## 2020-11-14 DIAGNOSIS — Z87891 Personal history of nicotine dependence: Secondary | ICD-10-CM | POA: Insufficient documentation

## 2020-11-14 DIAGNOSIS — Z7952 Long term (current) use of systemic steroids: Secondary | ICD-10-CM | POA: Insufficient documentation

## 2020-11-14 DIAGNOSIS — X58XXXA Exposure to other specified factors, initial encounter: Secondary | ICD-10-CM | POA: Insufficient documentation

## 2020-11-14 DIAGNOSIS — J45909 Unspecified asthma, uncomplicated: Secondary | ICD-10-CM | POA: Insufficient documentation

## 2020-11-14 DIAGNOSIS — S99921A Unspecified injury of right foot, initial encounter: Secondary | ICD-10-CM | POA: Insufficient documentation

## 2020-11-14 MED ORDER — IBUPROFEN 200 MG PO TABS
600.0000 mg | ORAL_TABLET | Freq: Once | ORAL | Status: AC
  Administered 2020-11-14: 600 mg via ORAL
  Filled 2020-11-14: qty 3

## 2020-11-14 NOTE — ED Provider Notes (Signed)
[facility]-EMERGENCY DEPT Provider Note   CSN: [id number] Arrival date & time: 11/14/20  2008     History Chief Complaint  Patient presents with   Foot Pain    Paula Wiggins is a 30 y.o. female presenting for evaluation of right foot pain.  She states 3 days ago when she was walking she rolled her foot/ankle and has been having pain since then.  Pain is mostly to the lateral aspect of her right foot and ankle.  It is worse with walking.  She has been walking on her heel to avoid weightbearing.  No numbness or tingling to the distal foot.  No prior injuries to this foot.  Has been treating with Tylenol.  The history is provided by the patient.      Past Medical History:  Diagnosis Date   Asthma     Patient Active Problem List   Diagnosis Date Noted   SOB (shortness of breath) 11/26/2018   GERD (gastroesophageal reflux disease) 03/10/2017   Mold exposure 03/10/2017   Asthma, severe persistent 02/13/2017    History reviewed. No pertinent surgical history.   OB History   No obstetric history on file.     Family History  Problem Relation Age of Onset   Hypertension Mother    Asthma Mother    Seizures Father    Hypertension Father    Congestive Heart Failure Paternal Grandfather    Asthma Sister    Emphysema Maternal Grandmother    Asthma Sister    Multiple sclerosis Paternal Aunt     Social History   Tobacco Use   Smoking status: Former    Pack years: 0.00    Types: Cigarettes   Smokeless tobacco: Never   Tobacco comments:    Smoked for 1 month total.   Vaping Use   Vaping Use: Never used  Substance Use Topics   Alcohol use: Not Currently   Drug use: No    Home Medications Prior to Admission medications   Medication Sig Start Date End Date Taking? Authorizing Provider  albuterol (PROVENTIL) (2.5 MG/3ML) 0.083% nebulizer solution Take 3 mLs by nebulization every 6 (six) hours as needed. Must have office visit for refills  10/01/20 10/01/21  Hoy Register, MD  albuterol (VENTOLIN HFA) 108 (90 Base) MCG/ACT inhaler Inhale 2 puffs into the lungs every 6 (six) hours as needed. Must have office visit for refills 10/01/20 10/01/21  Hoy Register, MD  budesonide-formoterol (SYMBICORT) 160-4.5 MCG/ACT inhaler INHALE 2 PUFFS INTO THE LUNGS 2 (TWO) TIMES DAILY. 05/27/20 05/27/21  Claiborne Rigg, NP  cetirizine (ZYRTEC) 10 MG tablet Take 1 tablet (10 mg total) by mouth daily. 09/28/19   Anders Simmonds, PA-C  fluticasone (FLONASE) 50 MCG/ACT nasal spray Place 2 sprays into both nostrils daily. 10/04/18   Storm Frisk, MD  meclizine (ANTIVERT) 25 MG tablet Take 1 tablet (25 mg total) by mouth 2 (two) times daily as needed for up to 10 doses for dizziness or nausea. 09/01/19   Cur[place]olo, Adam, DO  methocarbamol (ROBAXIN) 750 MG tablet Take 1 tablet (750 mg total) by mouth every 8 (eight) hours as needed for muscle spasms. 06/21/20   Claiborne Rigg, NP  montelukast (SINGULAIR) 10 MG tablet Take 1 tablet (10 mg total) by mouth at bedtime. 09/28/19   Anders Simmonds, PA-C  naproxen (NAPROSYN) 500 MG tablet TAKE 1 TABLET (500 MG TOTAL) BY MOUTH 2 (TWO) TIMES DAILY. 05/27/20 05/27/21  Claiborne Rigg, NP  prochlorperazine (COMPAZINE) 10 MG tablet Take 1 tablet (10 mg total) by mouth 2 (two) times daily as needed for nausea or vomiting. 09/28/19   Anders Simmonds, PA-C  traZODone (DESYREL) 100 MG tablet TAKE 1 TABLET (100 MG TOTAL) BY MOUTH AT BEDTIME. 12/08/19 12/07/20  Claiborne Rigg, NP  Fluticasone-Salmeterol (ADVAIR DISKUS) 250-50 MCG/DOSE AEPB Inhale 1 puff into the lungs 2 (two) times daily. 12/08/19 05/27/20  Claiborne Rigg, NP    Allergies    Oxycodone, Shrimp flavor, and Watermelon flavor  Review of Systems   Review of Systems  Musculoskeletal:  Positive for arthralgias and joint swelling.  Skin:  Negative for wound.  All other systems reviewed and are negative.  Physical Exam Updated Vital Signs BP (!) 111/99 (BP  Location: Left Arm)   Pulse (!) 105   Temp 98.2 F (36.8 C) (Oral)   Resp 16   Ht 5\' 3"  (1.6 m)   Wt 106.6 kg   LMP 11/05/2020   SpO2 100%   BMI 41.63 kg/m   Physical Exam Vitals and nursing note reviewed.  Constitutional:      Appearance: She is well-developed.  HENT:     Head: Normocephalic and atraumatic.  Eyes:     Conjunctiva/sclera: Conjunctivae normal.  Cardiovascular:     Rate and Rhythm: Normal rate.  Pulmonary:     Effort: Pulmonary effort is normal.  Musculoskeletal:     Comments: Right foot with some dorsal swelling. No obvious deformity. TTP to most of the dorsal aspect, worse along lateral aspect. TTP to lateral ankle regions. Pain with ROM, normal distal sensation and cap refill  Neurological:     Mental Status: She is alert.  Psychiatric:        Mood and Affect: Mood normal.        Behavior: Behavior normal.    ED Results / Procedures / Treatments   Labs (all labs ordered are listed, but only abnormal results are displayed) Labs Reviewed - No data to display  EKG None  Radiology DG Ankle Complete Right  Result Date: 11/14/2020 CLINICAL DATA:  Right ankle pain after injury several days ago. EXAM: RIGHT ANKLE - COMPLETE 3+ VIEW COMPARISON:  None. FINDINGS: There is no evidence of fracture, dislocation, or joint effusion. There is no evidence of arthropathy or other focal bone abnormality. Soft tissues are unremarkable. IMPRESSION: Negative. Electronically Signed   By: 11/16/2020 M.D.   On: 11/14/2020 21:38   DG Foot Complete Right  Result Date: 11/14/2020 CLINICAL DATA:  Right foot pain after injury 3 days ago. EXAM: RIGHT FOOT COMPLETE - 3+ VIEW COMPARISON:  None. FINDINGS: There is no evidence of fracture or dislocation. There is no evidence of arthropathy or other focal bone abnormality. Soft tissues are unremarkable. IMPRESSION: Negative. Electronically Signed   By: 11/16/2020 M.D.   On: 11/14/2020 21:36    Procedures Procedures    Medications Ordered in ED Medications  ibuprofen (ADVIL) tablet 600 mg (has no administration in time range)    ED Course  I have reviewed the triage vital signs and the nursing notes.  Pertinent labs & imaging results that were available during my care of the patient were reviewed by me and considered in my medical decision making (see chart for details).    MDM Rules/Calculators/A&P                          Patient with likely sprain  versus strain of the right foot/ankle after rolling it 3 days ago.  X-rays are negative for acute fracture.  She is placed in cam walker boot and given crutches for nonweightbearing.  Orthopedic referral for follow-up.  RICE therapy, NSAIDs for pain.  Discharged in no distress.  Discussed results, findings, treatment and follow up. Patient advised of return precautions. Patient verbalized understanding and agreed with plan.  Final Clinical Impression(s) / ED Diagnoses Final diagnoses:  Injury of right foot, initial encounter    Rx / DC Orders ED Discharge Orders     None        Yaqub Arney, [place] N, PA-C 11/14/20 2146    Wynetta Fines, MD 11/14/20 2259

## 2020-11-14 NOTE — ED Notes (Signed)
Pt fitted with cam boot and crutches, tolerated well.  Unable to obtain signature due to Topaz not working.

## 2020-11-14 NOTE — Discharge Instructions (Addendum)
Your xray shows no evidence of break. You have likely sprained/strained your foot/ankle.  Apply ice to your foot/ankle for 20 minutes at a time. You can take 600mg  ibuprofen every 6 hours as needed for pain. Schedule an appointment with the orthopedic specialist in 1-2 weeks for follow-up on your injury. Return to the ER for new or concerning symptoms.

## 2020-11-14 NOTE — ED Triage Notes (Signed)
Pt to ED with c/o R foot pain. Pt reports rolling her foot 3 days ago, now having increased pain. PSM intact, no selling or deformity noted.

## 2021-02-03 ENCOUNTER — Other Ambulatory Visit: Payer: Self-pay

## 2021-02-03 ENCOUNTER — Encounter: Payer: Self-pay | Admitting: Nurse Practitioner

## 2021-02-03 ENCOUNTER — Ambulatory Visit: Payer: Self-pay | Attending: Nurse Practitioner | Admitting: Nurse Practitioner

## 2021-02-03 VITALS — BP 117/81 | HR 105 | Ht 63.0 in | Wt 245.0 lb

## 2021-02-03 DIAGNOSIS — J4520 Mild intermittent asthma, uncomplicated: Secondary | ICD-10-CM

## 2021-02-03 DIAGNOSIS — R7303 Prediabetes: Secondary | ICD-10-CM

## 2021-02-03 DIAGNOSIS — Z114 Encounter for screening for human immunodeficiency virus [HIV]: Secondary | ICD-10-CM

## 2021-02-03 DIAGNOSIS — D72829 Elevated white blood cell count, unspecified: Secondary | ICD-10-CM

## 2021-02-03 DIAGNOSIS — Z113 Encounter for screening for infections with a predominantly sexual mode of transmission: Secondary | ICD-10-CM

## 2021-02-03 DIAGNOSIS — M25511 Pain in right shoulder: Secondary | ICD-10-CM

## 2021-02-03 DIAGNOSIS — K219 Gastro-esophageal reflux disease without esophagitis: Secondary | ICD-10-CM

## 2021-02-03 DIAGNOSIS — F5101 Primary insomnia: Secondary | ICD-10-CM

## 2021-02-03 DIAGNOSIS — G8929 Other chronic pain: Secondary | ICD-10-CM

## 2021-02-03 MED ORDER — TRAZODONE HCL 100 MG PO TABS
ORAL_TABLET | Freq: Every day | ORAL | 2 refills | Status: DC
  Filled 2021-02-03: qty 30, 30d supply, fill #0
  Filled 2021-03-31: qty 30, 30d supply, fill #1
  Filled 2021-06-10: qty 30, 30d supply, fill #0
  Filled 2021-08-08: qty 30, 30d supply, fill #1
  Filled 2021-10-28: qty 30, 30d supply, fill #2

## 2021-02-03 MED ORDER — ALBUTEROL SULFATE HFA 108 (90 BASE) MCG/ACT IN AERS
2.0000 | INHALATION_SPRAY | Freq: Four times a day (QID) | RESPIRATORY_TRACT | 1 refills | Status: DC | PRN
  Filled 2021-02-03: qty 18, 25d supply, fill #0
  Filled 2021-03-31: qty 18, 25d supply, fill #1

## 2021-02-03 MED ORDER — OMEPRAZOLE 20 MG PO CPDR
20.0000 mg | DELAYED_RELEASE_CAPSULE | Freq: Every day | ORAL | 3 refills | Status: DC
  Filled 2021-02-03: qty 30, 30d supply, fill #0

## 2021-02-03 MED ORDER — MELOXICAM 7.5 MG PO TABS
7.5000 mg | ORAL_TABLET | Freq: Every day | ORAL | 0 refills | Status: DC
  Filled 2021-02-03: qty 30, 30d supply, fill #0

## 2021-02-03 MED ORDER — METHOCARBAMOL 750 MG PO TABS
750.0000 mg | ORAL_TABLET | Freq: Three times a day (TID) | ORAL | 1 refills | Status: DC | PRN
  Filled 2021-02-03: qty 60, 20d supply, fill #0

## 2021-02-03 MED ORDER — FLUTICASONE PROPIONATE 50 MCG/ACT NA SUSP
2.0000 | Freq: Every day | NASAL | 6 refills | Status: DC
  Filled 2021-02-03: qty 16, 30d supply, fill #0

## 2021-02-03 MED ORDER — CETIRIZINE HCL 10 MG PO TABS
10.0000 mg | ORAL_TABLET | Freq: Every day | ORAL | 1 refills | Status: DC
  Filled 2021-02-03: qty 90, 90d supply, fill #0

## 2021-02-03 MED ORDER — BUDESONIDE-FORMOTEROL FUMARATE 160-4.5 MCG/ACT IN AERO
2.0000 | INHALATION_SPRAY | Freq: Two times a day (BID) | RESPIRATORY_TRACT | 6 refills | Status: DC
  Filled 2021-02-03: qty 10.2, 30d supply, fill #0
  Filled 2021-03-31: qty 10.2, 30d supply, fill #1
  Filled 2021-06-09: qty 10.2, 30d supply, fill #0

## 2021-02-03 MED ORDER — ALBUTEROL SULFATE (2.5 MG/3ML) 0.083% IN NEBU
3.0000 mL | INHALATION_SOLUTION | Freq: Four times a day (QID) | RESPIRATORY_TRACT | 3 refills | Status: DC | PRN
  Filled 2021-02-03 – 2021-06-09 (×2): qty 180, 15d supply, fill #0

## 2021-02-03 MED ORDER — MONTELUKAST SODIUM 10 MG PO TABS
10.0000 mg | ORAL_TABLET | Freq: Every day | ORAL | 3 refills | Status: DC
  Filled 2021-02-03: qty 30, 30d supply, fill #0

## 2021-02-03 NOTE — Progress Notes (Signed)
Assessment & Plan:  Paula Wiggins was seen today for medication refill.  Diagnoses and all orders for this visit:  Prediabetes -     Hemoglobin A1c -     CMP14+EGFR Continue blood sugar control as discussed in office today, low carbohydrate diet, and regular physical exercise as tolerated, 150 minutes per week (30 min each day, 5 days per week, or 50 min 3 days per week).   Mild intermittent asthma in adult without complication -     albuterol (PROVENTIL) (2.5 MG/3ML) 0.083% nebulizer solution; Take 3 mLs by nebulization every 6 (six) hours as needed. -     albuterol (VENTOLIN HFA) 108 (90 Base) MCG/ACT inhaler; Inhale 2 puffs into the lungs every 6 (six) hours as needed. -     budesonide-formoterol (SYMBICORT) 160-4.5 MCG/ACT inhaler; INHALE 2 PUFFS INTO THE LUNGS 2 (TWO) TIMES DAILY. -     montelukast (SINGULAIR) 10 MG tablet; Take 1 tablet (10 mg total) by mouth at bedtime. -     fluticasone (FLONASE) 50 MCG/ACT nasal spray; Place 2 sprays into both nostrils daily. -     cetirizine (ZYRTEC) 10 MG tablet; Take 1 tablet (10 mg total) by mouth daily.  Screening examination for STD (sexually transmitted disease) -     Cancel: Cytology - PAP -     Cervicovaginal ancillary only  Chronic right shoulder pain -     DG Shoulder Right; Future -     methocarbamol (ROBAXIN) 750 MG tablet; Take 1 tablet (750 mg total) by mouth every 8 (eight) hours as needed for muscle spasms. -     meloxicam (MOBIC) 7.5 MG tablet; Take 1 tablet (7.5 mg total) by mouth daily. For shoulder pain  Encounter for screening for HIV -     Cancel: HIV antibody (with reflex) -     HIV antibody (with reflex)  Primary insomnia -     traZODone (DESYREL) 100 MG tablet; TAKE 1 TABLET (100 MG TOTAL) BY MOUTH AT BEDTIME.  Leukocytosis, unspecified type -     CBC  GERD without esophagitis -     omeprazole (PRILOSEC) 20 MG capsule; Take 1 capsule (20 mg total) by mouth daily. INSTRUCTIONS: Avoid GERD Triggers: acidic, spicy  or fried foods, caffeine, coffee, sodas,  alcohol and chocolate.     Patient has been counseled on age-appropriate routine health concerns for screening and prevention. These are reviewed and up-to-date. Referrals have been placed accordingly. Immunizations are up-to-date or declined.    Subjective:   Chief Complaint  Patient presents with   Medication Refill   HPI Paula Wiggins 30 y.o. female presents to office today with complaints of right shoulder pain and hoarseness.  She has a past medical history of Asthma.   Right Shoulder Pain States she was lifting heavy boxes and fell sideways on her right shoulder. She has injured her right shoulder in the past and states she was told there were torn ligaments in her shoulder. She is unable to lift her right arm higher than her shoulder.   Prediabetes Well controlled at this time. She is not taking any glucose lowering medications.  Lab Results  Component Value Date   HGBA1C 5.7 (A) 06/27/2018    GERD Patient complains of hoarseness and laryngitis. She has a history of  GERD and is currently not taking any acid reducing medications. Was previously prescribed omeprazole.     Review of Systems  Constitutional:  Negative for fever, malaise/fatigue and weight loss.  HENT: Negative.  Negative for nosebleeds.   Eyes: Negative.  Negative for blurred vision, double vision and photophobia.  Respiratory: Negative.  Negative for cough and shortness of breath.   Cardiovascular: Negative.  Negative for chest pain, palpitations and leg swelling.  Gastrointestinal:  Positive for heartburn. Negative for abdominal pain, blood in stool, constipation, diarrhea, melena, nausea and vomiting.  Musculoskeletal:  Positive for joint pain. Negative for myalgias.  Neurological: Negative.  Negative for dizziness, focal weakness, seizures and headaches.  Psychiatric/Behavioral:  Negative for suicidal ideas. The patient has insomnia.    Past Medical History:   Diagnosis Date   Asthma     History reviewed. No pertinent surgical history.  Family History  Problem Relation Age of Onset   Hypertension Mother    Asthma Mother    Seizures Father    Hypertension Father    Congestive Heart Failure Paternal Grandfather    Asthma Sister    Emphysema Maternal Grandmother    Asthma Sister    Multiple sclerosis Paternal Aunt     Social History Reviewed with no changes to be made today.   Outpatient Medications Prior to Visit  Medication Sig Dispense Refill   meclizine (ANTIVERT) 25 MG tablet Take 1 tablet (25 mg total) by mouth 2 (two) times daily as needed for up to 10 doses for dizziness or nausea. 10 tablet 0   albuterol (PROVENTIL) (2.5 MG/3ML) 0.083% nebulizer solution Take 3 mLs by nebulization every 6 (six) hours as needed. Must have office visit for refills 90 mL 0   albuterol (VENTOLIN HFA) 108 (90 Base) MCG/ACT inhaler Inhale 2 puffs into the lungs every 6 (six) hours as needed. Must have office visit for refills 18 g 0   budesonide-formoterol (SYMBICORT) 160-4.5 MCG/ACT inhaler INHALE 2 PUFFS INTO THE LUNGS 2 (TWO) TIMES DAILY. 10.2 g 6   cetirizine (ZYRTEC) 10 MG tablet Take 1 tablet (10 mg total) by mouth daily. 30 tablet 2   fluticasone (FLONASE) 50 MCG/ACT nasal spray Place 2 sprays into both nostrils daily. 16 g 6   methocarbamol (ROBAXIN) 750 MG tablet Take 1 tablet (750 mg total) by mouth every 8 (eight) hours as needed for muscle spasms. 60 tablet 1   montelukast (SINGULAIR) 10 MG tablet Take 1 tablet (10 mg total) by mouth at bedtime. 90 tablet 3   naproxen (NAPROSYN) 500 MG tablet TAKE 1 TABLET (500 MG TOTAL) BY MOUTH 2 (TWO) TIMES DAILY. 60 tablet 3   prochlorperazine (COMPAZINE) 10 MG tablet Take 1 tablet (10 mg total) by mouth 2 (two) times daily as needed for nausea or vomiting. 10 tablet 0   traZODone (DESYREL) 100 MG tablet TAKE 1 TABLET (100 MG TOTAL) BY MOUTH AT BEDTIME. 90 tablet 2   No facility-administered medications  prior to visit.    Allergies  Allergen Reactions   Oxycodone Anaphylaxis, Shortness Of Breath and Swelling   Shrimp Flavor    Watermelon Flavor Swelling and Other (See Comments)    Swelling of tongue and lips break out       Objective:    BP 117/81   Pulse (!) 105   Ht _0  (1.6 m)   Wt 245 lb (111.1 kg)   LMP 01/30/2021   SpO2 98%   BMI 43.40 kg/m  Wt Readings from Last 3 Encounters:  02/03/21 245 lb (111.1 kg)  11/14/20 235 lb (106.6 kg)  05/27/20 238 lb (108 kg)    Physical Exam Vitals and nursing note reviewed.  Constitutional:  Appearance: She is well-developed.  HENT:     Head: Normocephalic and atraumatic.     Mouth/Throat:     Lips: Pink.     Mouth: Mucous membranes are moist.     Tongue: No lesions.     Palate: No mass and lesions.     Pharynx: Oropharynx is clear. Uvula midline. No pharyngeal swelling, oropharyngeal exudate, posterior oropharyngeal erythema or uvula swelling.     Tonsils: No tonsillar exudate or tonsillar abscesses. 1+ on the right. 1+ on the left.  Cardiovascular:     Rate and Rhythm: Normal rate and regular rhythm.     Heart sounds: Normal heart sounds. No murmur heard.   No friction rub. No gallop.  Pulmonary:     Effort: Pulmonary effort is normal. No tachypnea or respiratory distress.     Breath sounds: Normal breath sounds. No decreased breath sounds, wheezing, rhonchi or rales.  Chest:     Chest wall: No tenderness.  Abdominal:     General: Bowel sounds are normal.     Palpations: Abdomen is soft.  Musculoskeletal:     Right shoulder: Tenderness present. Decreased range of motion.     Cervical back: Normal range of motion.  Skin:    General: Skin is warm and dry.  Neurological:     Mental Status: She is alert and oriented to person, place, and time.     Coordination: Coordination normal.  Psychiatric:        Behavior: Behavior normal. Behavior is cooperative.        Thought Content: Thought content normal.         Judgment: Judgment normal.         Patient has been counseled extensively about nutrition and exercise as well as the importance of adherence with medications and regular follow-up. The patient was given clear instructions to go to ER or return to medical center if symptoms don't improve, worsen or new problems develop. The patient verbalized understanding.   Follow-up: Return for PAP/Hoarseness.   Gildardo Pounds, FNP-BC [facility] [place], [place]   02/03/2021, 6:51 PM

## 2021-02-04 ENCOUNTER — Other Ambulatory Visit: Payer: Self-pay | Admitting: Nurse Practitioner

## 2021-02-04 ENCOUNTER — Other Ambulatory Visit: Payer: Self-pay

## 2021-02-04 DIAGNOSIS — D72829 Elevated white blood cell count, unspecified: Secondary | ICD-10-CM

## 2021-02-04 LAB — CMP14+EGFR
ALT: 8 IU/L (ref 0–32)
AST: 11 IU/L (ref 0–40)
Albumin/Globulin Ratio: 1.5 (ref 1.2–2.2)
Albumin: 4.5 g/dL (ref 3.9–5.0)
Alkaline Phosphatase: 90 IU/L (ref 44–121)
BUN/Creatinine Ratio: 18 (ref 9–23)
BUN: 15 mg/dL (ref 6–20)
Bilirubin Total: 0.3 mg/dL (ref 0.0–1.2)
CO2: 23 mmol/L (ref 20–29)
Calcium: 9.6 mg/dL (ref 8.7–10.2)
Chloride: 104 mmol/L (ref 96–106)
Creatinine, Ser: 0.84 mg/dL (ref 0.57–1.00)
Globulin, Total: 3 g/dL (ref 1.5–4.5)
Glucose: 101 mg/dL — ABNORMAL HIGH (ref 65–99)
Potassium: 4.2 mmol/L (ref 3.5–5.2)
Sodium: 140 mmol/L (ref 134–144)
Total Protein: 7.5 g/dL (ref 6.0–8.5)
eGFR: 96 mL/min/{1.73_m2} (ref >59)

## 2021-02-04 LAB — CBC
Hematocrit: 41.3 % (ref 34.0–46.6)
Hemoglobin: 13.8 g/dL (ref 11.1–15.9)
MCH: 32.2 pg (ref 26.6–33.0)
MCHC: 33.4 g/dL (ref 31.5–35.7)
MCV: 97 fL (ref 79–97)
Platelets: 320 10*3/uL (ref 150–450)
RBC: 4.28 x10E6/uL (ref 3.77–5.28)
RDW: 12.5 % (ref 11.7–15.4)
WBC: 13.2 10*3/uL — ABNORMAL HIGH (ref 3.4–10.8)

## 2021-02-04 LAB — HEMOGLOBIN A1C
Est. average glucose Bld gHb Est-mCnc: 114 mg/dL
Hgb A1c MFr Bld: 5.6 % (ref 4.8–5.6)

## 2021-02-04 LAB — HIV ANTIBODY (ROUTINE TESTING W REFLEX): HIV Screen 4th Generation wRfx: NONREACTIVE

## 2021-02-10 ENCOUNTER — Other Ambulatory Visit: Payer: Self-pay

## 2021-03-31 ENCOUNTER — Other Ambulatory Visit: Payer: Self-pay

## 2021-04-01 ENCOUNTER — Other Ambulatory Visit: Payer: Self-pay

## 2021-06-09 ENCOUNTER — Other Ambulatory Visit: Payer: Self-pay | Admitting: Nurse Practitioner

## 2021-06-09 ENCOUNTER — Other Ambulatory Visit: Payer: Self-pay

## 2021-06-09 DIAGNOSIS — J4520 Mild intermittent asthma, uncomplicated: Secondary | ICD-10-CM

## 2021-06-09 MED ORDER — ALBUTEROL SULFATE HFA 108 (90 BASE) MCG/ACT IN AERS
2.0000 | INHALATION_SPRAY | Freq: Four times a day (QID) | RESPIRATORY_TRACT | 1 refills | Status: DC | PRN
  Filled 2021-06-09: qty 18, 25d supply, fill #0
  Filled 2021-08-08: qty 18, 25d supply, fill #1

## 2021-06-09 NOTE — Telephone Encounter (Signed)
Requested Prescriptions  Pending Prescriptions Disp Refills   albuterol (VENTOLIN HFA) 108 (90 Base) MCG/ACT inhaler 18 g 1    Sig: Inhale 2 puffs into the lungs every 6 (six) hours as needed.     Pulmonology:  Beta Agonists Failed - 06/09/2021  4:11 PM      Failed - One inhaler should last at least one month. If the patient is requesting refills earlier, contact the patient to check for uncontrolled symptoms.      Passed - Valid encounter within last 12 months    Recent Outpatient Visits          4 months ago Prediabetes   [facility] [place], Shea Stakes, NP   1 year ago Encounter for annual physical exam   [facility] [place], Shea Stakes, NP   1 year ago Encounter for examination following treatment at hospital   [facility] [place], [place], [place]   2 years ago Severe persistent asthma in adult without complication   [facility] Storm Frisk, MD   2 years ago Gastroesophageal reflux disease, esophagitis presence not specified   [facility] [place], Shea Stakes, NP

## 2021-06-10 ENCOUNTER — Other Ambulatory Visit: Payer: Self-pay

## 2021-08-08 ENCOUNTER — Other Ambulatory Visit: Payer: Self-pay | Admitting: Nurse Practitioner

## 2021-08-08 ENCOUNTER — Other Ambulatory Visit: Payer: Self-pay

## 2021-08-08 DIAGNOSIS — J4520 Mild intermittent asthma, uncomplicated: Secondary | ICD-10-CM

## 2021-10-15 ENCOUNTER — Ambulatory Visit: Payer: Self-pay | Admitting: Physician Assistant

## 2021-10-28 ENCOUNTER — Other Ambulatory Visit: Payer: Self-pay

## 2021-10-28 ENCOUNTER — Other Ambulatory Visit: Payer: Self-pay | Admitting: Nurse Practitioner

## 2021-10-28 DIAGNOSIS — J4520 Mild intermittent asthma, uncomplicated: Secondary | ICD-10-CM

## 2021-10-28 DIAGNOSIS — F5101 Primary insomnia: Secondary | ICD-10-CM

## 2021-10-28 MED ORDER — BUDESONIDE-FORMOTEROL FUMARATE 160-4.5 MCG/ACT IN AERO
2.0000 | INHALATION_SPRAY | Freq: Two times a day (BID) | RESPIRATORY_TRACT | 2 refills | Status: DC
  Filled 2021-10-28 – 2022-01-20 (×4): qty 10.2, 30d supply, fill #0

## 2021-10-28 MED ORDER — TRAZODONE HCL 100 MG PO TABS
ORAL_TABLET | Freq: Every day | ORAL | 0 refills | Status: DC
  Filled 2021-10-28: qty 30, fill #0
  Filled 2021-11-25: qty 30, 30d supply, fill #0

## 2021-10-28 NOTE — Telephone Encounter (Signed)
Called pt  - LMOM to return call and schedule appointment. 

## 2021-10-28 NOTE — Telephone Encounter (Signed)
Requested medications are due for refill today.  yes  Requested medications are on the active medications list.  yes  Last refill. 1/23/20203 18 1 rf  Future visit scheduled.   no  Notes to clinic.  Please review dosage for refill.    Requested Prescriptions  Pending Prescriptions Disp Refills   albuterol (VENTOLIN HFA) 108 (90 Base) MCG/ACT inhaler 8 g 1    Sig: Inhale 2 puffs into the lungs every 6 (six) hours as needed.     Pulmonology:  Beta Agonists 2 Passed - 10/28/2021 11:49 AM      Passed - Last BP in normal range    BP Readings from Last 1 Encounters:  02/03/21 117/81         Passed - Last Heart Rate in normal range    Pulse Readings from Last 1 Encounters:  02/03/21 (!) 105         Passed - Valid encounter within last 12 months    Recent Outpatient Visits           8 months ago Prediabetes   [place], Vernia Buff, NP   1 year ago Encounter for annual physical exam   [place], Vernia Buff, NP   2 years ago Encounter for examination following treatment at [place], [place], [place]   3 years ago Severe persistent asthma in adult without complication   [place], Patrick E, MD   3 years ago Gastroesophageal reflux disease, esophagitis presence not specified   [place], Vernia Buff, NP              Signed Prescriptions Disp Refills   traZODone (DESYREL) 100 MG tablet 30 tablet 0    Sig: TAKE 1 TABLET (100 MG TOTAL) BY MOUTH AT BEDTIME.     Psychiatry: Antidepressants - Serotonin Modulator Failed - 10/28/2021 11:49 AM      Failed - Valid encounter within last 6 months    Recent Outpatient Visits           8 months ago Prediabetes   [place], Vernia Buff, NP   1 year ago Encounter for annual physical exam   [place], Vernia Buff, NP   2 years ago Encounter for examination following treatment at [place], [place], [place]   3 years ago Severe persistent asthma in adult without complication   [place], MD   3 years ago Gastroesophageal reflux disease, esophagitis presence not specified   [place], NP               budesonide-formoterol (SYMBICORT) 160-4.5 MCG/ACT inhaler 10.2 g 2    Sig: INHALE 2 PUFFS INTO THE LUNGS 2 (TWO) TIMES DAILY.     Pulmonology:  Combination Products Passed - 10/28/2021 11:49 AM      Passed - Valid encounter within last 12 months    Recent Outpatient Visits           8 months ago Prediabetes   [place], Vernia Buff, NP   1 year ago Encounter for annual physical exam   [place],  Vernia Buff, NP   2 years ago Encounter for examination following treatment at [place], [place]   3 years ago Severe persistent asthma in adult without complication   [place], MD   3 years ago Gastroesophageal reflux disease, esophagitis presence not specified   [place], Vernia Buff, NP

## 2021-10-28 NOTE — Telephone Encounter (Signed)
Requested Prescriptions  Pending Prescriptions Disp Refills  . albuterol (VENTOLIN HFA) 108 (90 Base) MCG/ACT inhaler 18 g 1    Sig: Inhale 2 puffs into the lungs every 6 (six) hours as needed.     Pulmonology:  Beta Agonists 2 Passed - 10/28/2021 11:49 AM      Passed - Last BP in normal range    BP Readings from Last 1 Encounters:  02/03/21 117/81         Passed - Last Heart Rate in normal range    Pulse Readings from Last 1 Encounters:  02/03/21 (!) 105         Passed - Valid encounter within last 12 months    Recent Outpatient Visits          8 months ago Prediabetes   [place] [facility] [place], Shea Stakes, NP   1 year ago Encounter for annual physical exam   [facility] [place], Shea Stakes, NP   2 years ago Encounter for examination following treatment at hospital   [facility] [place], [place], [place]   3 years ago Severe persistent asthma in adult without complication   [facility] Storm Frisk, MD   3 years ago Gastroesophageal reflux disease, esophagitis presence not specified    [facility] [place], [place] W, NP             . traZODone (DESYREL) 100 MG tablet 30 tablet 0    Sig: TAKE 1 TABLET (100 MG TOTAL) BY MOUTH AT BEDTIME.     Psychiatry: Antidepressants - Serotonin Modulator Failed - 10/28/2021 11:49 AM      Failed - Valid encounter within last 6 months    Recent Outpatient Visits          8 months ago Prediabetes   [facility] [place], Shea Stakes, NP   1 year ago Encounter for annual physical exam   [facility] [place], Shea Stakes, NP   2 years ago Encounter for examination following treatment at hospital   [facility] [place], [place], [place]   3 years ago Severe persistent asthma in adult without complication   [facility] Storm Frisk, MD   3 years ago Gastroesophageal reflux disease, esophagitis presence not specified   [facility] [place], [place] W, NP             . budesonide-formoterol (SYMBICORT) 160-4.5 MCG/ACT inhaler 10.2 g 2    Sig: INHALE 2 PUFFS INTO THE LUNGS 2 (TWO) TIMES DAILY.     Pulmonology:  Combination Products Passed - 10/28/2021 11:49 AM      Passed - Valid encounter within last 12 months    Recent Outpatient Visits          8 months ago Prediabetes   [facility] [place], Shea Stakes, NP   1 year ago Encounter for annual physical exam   [facility] [place], Shea Stakes, NP   2 years ago Encounter for examination following treatment at hospital   [facility] [place], [place], [place]   3 years ago Severe persistent asthma in adult without complication   [facility] Storm Frisk, MD   3 years ago Gastroesophageal  reflux disease, esophagitis presence not specified   [facility] [place], Shea Stakes, NP

## 2021-10-28 NOTE — Telephone Encounter (Signed)
Medication Refill - Medication: albuterol (VENTOLIN HFA) 108 (90 Base) MCG/ACT inhaler,traZODone (DESYREL) 100 MG tablet, budesonide-formoterol (SYMBICORT) 160-4.5 MCG/ACT inhaler   Has the patient contacted their pharmacy? No. No, more refills   (Agent: If no, request that the patient contact the pharmacy for the refill. If patient does not wish to contact the pharmacy document the reason why and proceed with request.)   Preferred Pharmacy (with phone number or street name):  [facility] [place]. [address], [place] [address]  Phone: [phone] Fax: [phone]  Hours: M-F 7:30a-6:00p   Has the patient been seen for an appointment in the last year OR does the patient have an upcoming appointment? No.  Agent: Please be advised that RX refills may take up to 3 business days. We ask that you follow-up with your pharmacy.

## 2021-10-29 ENCOUNTER — Other Ambulatory Visit: Payer: Self-pay

## 2021-10-29 MED ORDER — ALBUTEROL SULFATE HFA 108 (90 BASE) MCG/ACT IN AERS
2.0000 | INHALATION_SPRAY | Freq: Four times a day (QID) | RESPIRATORY_TRACT | 1 refills | Status: DC | PRN
  Filled 2021-10-29 – 2021-11-25 (×2): qty 18, 25d supply, fill #0
  Filled 2021-12-25: qty 18, 25d supply, fill #1
  Filled 2022-01-16: qty 6.7, 25d supply, fill #1

## 2021-10-29 NOTE — Telephone Encounter (Signed)
Requested Prescriptions  Pending Prescriptions Disp Refills  . albuterol (VENTOLIN HFA) 108 (90 Base) MCG/ACT inhaler 18 g 1    Sig: Inhale 2 puffs into the lungs every 6 (six) hours as needed.     Pulmonology:  Beta Agonists 2 Passed - 10/28/2021  1:40 PM      Passed - Last BP in normal range    BP Readings from Last 1 Encounters:  02/03/21 117/81         Passed - Last Heart Rate in normal range    Pulse Readings from Last 1 Encounters:  02/03/21 (!) 105         Passed - Valid encounter within last 12 months    Recent Outpatient Visits          8 months ago Prediabetes   [facility] [place], Shea Stakes, NP   1 year ago Encounter for annual physical exam   [facility] [place], Shea Stakes, NP   2 years ago Encounter for examination following treatment at hospital   [facility] [place], [place], [place]   3 years ago Severe persistent asthma in adult without complication   [facility] Storm Frisk, MD   3 years ago Gastroesophageal reflux disease, esophagitis presence not specified   [facility] [place], Shea Stakes, NP

## 2021-11-05 ENCOUNTER — Other Ambulatory Visit: Payer: Self-pay

## 2021-11-25 ENCOUNTER — Other Ambulatory Visit: Payer: Self-pay

## 2021-12-17 ENCOUNTER — Other Ambulatory Visit: Payer: Self-pay | Admitting: Nurse Practitioner

## 2021-12-17 ENCOUNTER — Other Ambulatory Visit: Payer: Self-pay

## 2021-12-17 DIAGNOSIS — F5101 Primary insomnia: Secondary | ICD-10-CM

## 2021-12-19 ENCOUNTER — Other Ambulatory Visit: Payer: Self-pay

## 2021-12-25 ENCOUNTER — Ambulatory Visit: Payer: Self-pay | Admitting: Critical Care Medicine

## 2021-12-25 ENCOUNTER — Other Ambulatory Visit: Payer: Self-pay

## 2021-12-29 ENCOUNTER — Ambulatory Visit: Payer: Self-pay | Admitting: Critical Care Medicine

## 2021-12-29 NOTE — Progress Notes (Deleted)
   Established Patient Office Visit  Subjective   Patient ID: Paula Wiggins, female    DOB: 1990/05/22  Age: 31 y.o. MRN: [id number]  No chief complaint on file.   HPI  {History (Optional):23778}  ROS    Objective:     There were no vitals taken for this visit. {Vitals History (Optional):23777}  Physical Exam   No results found for any visits on 12/29/21.  {Labs (Optional):23779}  The ASCVD Risk score (Arnett DK, et al., 2019) failed to calculate for the following reasons:   The 2019 ASCVD risk score is only valid for ages 47 to 11    Assessment & Plan:   Problem List Items Addressed This Visit   None   No follow-ups on file.    Shan Levans, MD

## 2022-01-01 ENCOUNTER — Other Ambulatory Visit: Payer: Self-pay

## 2022-01-08 ENCOUNTER — Emergency Department (HOSPITAL_COMMUNITY)
Admission: EM | Admit: 2022-01-08 | Discharge: 2022-01-08 | Disposition: A | Discharge status: Home / Self Care | Payer: Self-pay | Attending: Emergency Medicine | Admitting: Emergency Medicine

## 2022-01-08 ENCOUNTER — Encounter (HOSPITAL_COMMUNITY): Payer: Self-pay

## 2022-01-08 ENCOUNTER — Emergency Department (HOSPITAL_COMMUNITY): Payer: Self-pay

## 2022-01-08 ENCOUNTER — Other Ambulatory Visit: Payer: Self-pay

## 2022-01-08 DIAGNOSIS — X501XXA Overexertion from prolonged static or awkward postures, initial encounter: Secondary | ICD-10-CM | POA: Insufficient documentation

## 2022-01-08 DIAGNOSIS — S93491A Sprain of other ligament of right ankle, initial encounter: Secondary | ICD-10-CM | POA: Insufficient documentation

## 2022-01-08 DIAGNOSIS — J45909 Unspecified asthma, uncomplicated: Secondary | ICD-10-CM | POA: Insufficient documentation

## 2022-01-08 DIAGNOSIS — Z7951 Long term (current) use of inhaled steroids: Secondary | ICD-10-CM | POA: Insufficient documentation

## 2022-01-08 NOTE — Discharge Instructions (Signed)
You were seen here today for your ankle pain.  You not have any broken bones but you have a severe sprain.  You may use the provided cam walker boot as discussed.  Ice the area and elevate it, may follow-up with the sports medicine provider listed below as necessary and return to the ER with any severe symptoms.

## 2022-01-08 NOTE — ED Provider Notes (Signed)
[facility]-EMERGENCY DEPT Provider Note   CSN: [id number] Arrival date & time: 01/08/22  2053     History  Chief Complaint  Patient presents with   Foot Pain    Paula Wiggins is a 31 y.o. female who presents to the ER with concern for right ankle pain x1 week after she inverted her foot while walking up the stairs.  States that the pain has not improved.  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  Denies any numbness in the foot or ankle though some intermittent tingling in the right fourth and fifth toes.  Pain over the dorsum of the foot and lateral ankle.  I personally read this patient's medical records.  Has history of GERD and asthma severe persistent.  She is not anticoagulated.  HPI     Home Medications Prior to Admission medications   Medication Sig Start Date End Date Taking? Authorizing Provider  albuterol (PROVENTIL) (2.5 MG/3ML) 0.083% nebulizer solution Take 3 mLs by nebulization every 6 (six) hours as needed. 02/03/21 02/03/22  Claiborne Rigg, NP  albuterol (VENTOLIN HFA) 108 (90 Base) MCG/ACT inhaler Inhale 2 puffs into the lungs once every 6 (six) hours as needed. 10/29/21   Claiborne Rigg, NP  budesonide-formoterol (SYMBICORT) 160-4.5 MCG/ACT inhaler INHALE 2 PUFFS INTO THE LUNGS 2 (TWO) TIMES DAILY. 10/28/21   Claiborne Rigg, NP  cetirizine (ZYRTEC) 10 MG tablet Take 1 tablet (10 mg total) by mouth daily. 02/03/21 05/04/21  Claiborne Rigg, NP  fluticasone (FLONASE) 50 MCG/ACT nasal spray Place 2 sprays into both nostrils daily. 02/03/21   Claiborne Rigg, NP  meclizine (ANTIVERT) 25 MG tablet Take 1 tablet (25 mg total) by mouth 2 (two) times daily as needed for up to 10 doses for dizziness or nausea. 09/01/19   Curatolo, Adam, DO  meloxicam (MOBIC) 7.5 MG tablet Take 1 tablet (7.5 mg total) by mouth daily. For shoulder pain 02/03/21   Claiborne Rigg, NP  methocarbamol (ROBAXIN) 750 MG tablet Take 1 tablet (750 mg total) by  mouth every 8 (eight) hours as needed for muscle spasms. 02/03/21   Claiborne Rigg, NP  montelukast (SINGULAIR) 10 MG tablet Take 1 tablet (10 mg total) by mouth at bedtime. 02/03/21   Claiborne Rigg, NP  omeprazole (PRILOSEC) 20 MG capsule Take 1 capsule (20 mg total) by mouth daily. 02/03/21 05/04/21  Claiborne Rigg, NP  traZODone (DESYREL) 100 MG tablet TAKE 1 TABLET (100 MG TOTAL) BY MOUTH ONCE NIGHTLY AT BEDTIME. 10/28/21   Claiborne Rigg, NP  Fluticasone-Salmeterol (ADVAIR DISKUS) 250-50 MCG/DOSE AEPB Inhale 1 puff into the lungs 2 (two) times daily. 12/08/19 05/27/20  Claiborne Rigg, NP      Allergies    Oxycodone, Shrimp flavor, and Watermelon flavor    Review of Systems   Review of Systems  Musculoskeletal:        Ankle pain    Physical Exam Updated Vital Signs BP (!) 135/97 (BP Location: Left Arm)   Pulse 94   Temp 98.3 F (36.8 C) (Oral)   Resp 17   Ht 5\' 3"  (1.6 m)   Wt 116.1 kg   LMP 01/07/2022   SpO2 100%   BMI 45.35 kg/m  Physical Exam Vitals and nursing note reviewed.  Constitutional:      Appearance: She is obese. She is not ill-appearing or toxic-appearing.  HENT:     Head: Normocephalic and atraumatic.  Eyes:     General: No scleral icterus.       Right eye: No discharge.        Left eye: No discharge.     Conjunctiva/sclera: Conjunctivae normal.  Pulmonary:     Effort: Pulmonary effort is normal.  Musculoskeletal:     Right knee: Normal.     Left knee: Normal.     Right lower leg: Normal.     Left lower leg: Normal.     Right ankle: Swelling present. Tenderness present over the lateral malleolus, ATF ligament and AITF ligament. Decreased range of motion. Anterior drawer test negative.     Right Achilles Tendon: Normal.     Left ankle: Normal.     Left Achilles Tendon: Normal.     Right foot: Normal.     Left foot: Normal.     Comments: Normal capillary refill and full passive range of motion but with mild pain over the lateral malleolus  with inversion of the foot.  Able to wiggle the toes and actively plantar and dorsiflex the foot though with decreased range of motion secondary to pain.  Skin:    General: Skin is warm and dry.     Capillary Refill: Capillary refill takes less than 2 seconds.  Neurological:     General: No focal deficit present.     Mental Status: She is alert.  Psychiatric:        Mood and Affect: Mood normal.     ED Results / Procedures / Treatments   Labs (all labs ordered are listed, but only abnormal results are displayed) Labs Reviewed - No data to display  EKG None  Radiology DG Ankle Complete Right  Result Date: 01/08/2022 CLINICAL DATA:  Twisting injury 1 week ago. Persistent pain and lateral bruising. EXAM: RIGHT ANKLE - COMPLETE 3+ VIEW COMPARISON:  Radiographs 11/14/2020. FINDINGS: The mineralization and alignment are normal. There is no evidence of acute fracture or dislocation. The joint spaces are preserved. Mild lateral soft tissue swelling without evidence of foreign body. IMPRESSION: Mild lateral soft tissue swelling. No evidence of acute fracture or dislocation. Electronically Signed   By: Carey Bullocks M.D.   On: 01/08/2022 22:01    Procedures Procedures    Medications Ordered in ED Medications - No data to display  ED Course/ Medical Decision Making/ A&P                           Medical Decision Making 31 year old female who presents concern for right ankle pain after inverting her foot while walking upstairs.  Hypertensive on intake, obese.  Physical exam reassuring without laxity of the right ankle.  There is TTP over the AITF, normal capillary refill and sensation in all digits of the feet.  Amount and/or Complexity of Data Reviewed Radiology: independent interpretation performed.    Details: No acute fracture or dislocation noted on imaging, visualized this provider.   Suspect acute ankle sprain in context of recent injury few months prior.  CAM Walker boot  offered given patient is primarily on her feet for work.  Rehab instructions given.  No further report in the ED at this time.  Paula Wiggins  voiced understanding of her medical evaluation and treatment plan. Each of their questions answered to their expressed satisfaction.  Return precautions were given.  Patient is well-appearing, stable, and was discharged in good condition.  This chart was dictated using voice recognition software, Dragon. Despite the best  efforts of this provider to proofread and correct errors, errors may still occur which can change documentation meaning.  Final Clinical Impression(s) / ED Diagnoses Final diagnoses:  Sprain of anterior talofibular ligament of right ankle, initial encounter    Rx / DC Orders ED Discharge Orders     None         Sherrilee Gilles 01/08/22 2255    Lorre Nick, MD 01/09/22 2108

## 2022-01-08 NOTE — ED Triage Notes (Signed)
Reports rolling right ankle several day ago. Ambulatory.

## 2022-01-16 ENCOUNTER — Other Ambulatory Visit: Payer: Self-pay | Admitting: Nurse Practitioner

## 2022-01-16 DIAGNOSIS — J4520 Mild intermittent asthma, uncomplicated: Secondary | ICD-10-CM

## 2022-01-20 ENCOUNTER — Other Ambulatory Visit: Payer: Self-pay

## 2022-01-20 NOTE — Telephone Encounter (Signed)
Requested medication (s) are due for refill today: yes  Requested medication (s) are on the active medication list: yes  Last refill:  02/03/21  Future visit scheduled: no  Notes to clinic:  pt had no show on 12/29/21, ok to refill, pt meets protocol     Requested Prescriptions  Pending Prescriptions Disp Refills   albuterol (PROVENTIL) (2.5 MG/3ML) 0.083% nebulizer solution 90 mL 3    Sig: Take 3 mLs by nebulization every 6 (six) hours as needed.     Pulmonology:  Beta Agonists 2 Passed - 01/16/2022  6:56 PM      Passed - Last BP in normal range    BP Readings from Last 1 Encounters:  01/08/22 134/89         Passed - Last Heart Rate in normal range    Pulse Readings from Last 1 Encounters:  01/08/22 89         Passed - Valid encounter within last 12 months    Recent Outpatient Visits           11 months ago Prediabetes   [facility] [place], Shea Stakes, NP   1 year ago Encounter for annual physical exam   [facility] [place], Shea Stakes, NP   2 years ago Encounter for examination following treatment at hospital   [facility] [place], [place], [place]   3 years ago Severe persistent asthma in adult without complication   [facility] Storm Frisk, MD   3 years ago Gastroesophageal reflux disease, esophagitis presence not specified   [facility] [place], Shea Stakes, NP

## 2022-01-21 ENCOUNTER — Other Ambulatory Visit: Payer: Self-pay

## 2022-03-17 ENCOUNTER — Other Ambulatory Visit: Payer: Self-pay

## 2022-03-17 ENCOUNTER — Encounter: Payer: Self-pay | Admitting: Nurse Practitioner

## 2022-03-17 ENCOUNTER — Ambulatory Visit: Payer: Self-pay | Attending: Nurse Practitioner | Admitting: Nurse Practitioner

## 2022-03-17 VITALS — BP 113/77 | HR 94 | Temp 98.0°F | Ht 64.25 in | Wt 253.2 lb

## 2022-03-17 DIAGNOSIS — G44229 Chronic tension-type headache, not intractable: Secondary | ICD-10-CM

## 2022-03-17 DIAGNOSIS — J4520 Mild intermittent asthma, uncomplicated: Secondary | ICD-10-CM

## 2022-03-17 MED ORDER — FLUTICASONE-SALMETEROL 230-21 MCG/ACT IN AERO
2.0000 | INHALATION_SPRAY | Freq: Two times a day (BID) | RESPIRATORY_TRACT | 12 refills | Status: DC
  Filled 2022-03-17: qty 12, fill #0

## 2022-03-17 MED ORDER — ALBUTEROL SULFATE HFA 108 (90 BASE) MCG/ACT IN AERS
2.0000 | INHALATION_SPRAY | Freq: Four times a day (QID) | RESPIRATORY_TRACT | 1 refills | Status: DC | PRN
  Filled 2022-03-17: qty 6.7, 25d supply, fill #0

## 2022-03-17 MED ORDER — CETIRIZINE HCL 10 MG PO TABS
10.0000 mg | ORAL_TABLET | Freq: Every day | ORAL | 3 refills | Status: DC
  Filled 2022-03-17: qty 90, 90d supply, fill #0

## 2022-03-17 MED ORDER — ALBUTEROL SULFATE (2.5 MG/3ML) 0.083% IN NEBU
3.0000 mL | INHALATION_SOLUTION | Freq: Four times a day (QID) | RESPIRATORY_TRACT | 3 refills | Status: DC | PRN
  Filled 2022-03-17: qty 90, 8d supply, fill #0

## 2022-03-17 MED ORDER — MONTELUKAST SODIUM 10 MG PO TABS
10.0000 mg | ORAL_TABLET | Freq: Every day | ORAL | 3 refills | Status: DC
  Filled 2022-03-17: qty 90, 90d supply, fill #0

## 2022-03-17 MED ORDER — AMITRIPTYLINE HCL 10 MG PO TABS
10.0000 mg | ORAL_TABLET | Freq: Every day | ORAL | 1 refills | Status: DC
  Filled 2022-03-17: qty 30, 30d supply, fill #0

## 2022-03-17 NOTE — Progress Notes (Signed)
Assessment & Plan:  Paula Wiggins was seen today for asthma.  Diagnoses and all orders for this visit:  Mild intermittent asthma in adult without complication -     albuterol (PROVENTIL) (2.5 MG/3ML) 0.083% nebulizer solution; Take 3 mLs by nebulization every 6 (six) hours as needed. -     cetirizine (ZYRTEC) 10 MG tablet; Take 1 tablet (10 mg total) by mouth daily. -     albuterol (VENTOLIN HFA) 108 (90 Base) MCG/ACT inhaler; Inhale 2 puffs into the lungs once every 6 (six) hours as needed. -     montelukast (SINGULAIR) 10 MG tablet; Take 1 tablet (10 mg total) by mouth at bedtime. -     fluticasone-salmeterol (ADVAIR HFA) 230-21 MCG/ACT inhaler; Inhale 2 puffs into the lungs 2 (two) times daily. NEEDS PASS  Chronic tension-type headache, not intractable -     amitriptyline (ELAVIL) 10 MG tablet; Take 1 tablet (10 mg total) by mouth at bedtime. For headaches and depression    Patient has been counseled on age-appropriate routine health concerns for screening and prevention. These are reviewed and up-to-date. Referrals have been placed accordingly. Immunizations are up-to-date or declined.    Subjective:   Chief Complaint  Patient presents with   Asthma   HPI ISA HITZ 31 y.o. female presents to office today for asthma exacerbation and tension headaches.    HEADACHES Experiencing what feels like migraine headaches 4-5 times per week. Associated symptoms: light sensitivity. Headaches occur mostly in the bilateral temporal region and behind eyes and into forehead.  Can last 2-3 days.    ASTHMA She has been using albuterol inhaler and nebs excessively over the past few months.  Associated symptoms include dyspnea, wheezing, and chest tightness .  Suspected precipitants include no identifiable factor.  Symptoms have been waxing and waning since their onset.  The previous exacerbation occurred 1 day ago. She has treated this current exacerbation with short-acting inhaled  beta-adrenergic agonists. The patient has been deviating from this regimen as follows: not taking symbicort, zyrtec or singulair as prescribed. States symbicort caused her to overeat.    Review of Systems  Constitutional:  Negative for fever, malaise/fatigue and weight loss.  HENT: Negative.  Negative for nosebleeds.   Eyes: Negative.  Negative for blurred vision, double vision and photophobia.  Respiratory:  Positive for shortness of breath and wheezing. Negative for cough, hemoptysis and sputum production.   Cardiovascular: Negative.  Negative for chest pain, palpitations and leg swelling.  Gastrointestinal: Negative.  Negative for heartburn, nausea and vomiting.  Musculoskeletal: Negative.  Negative for myalgias.  Neurological: Negative.  Negative for dizziness, focal weakness, seizures and headaches.  Psychiatric/Behavioral: Negative.  Negative for suicidal ideas.     Past Medical History:  Diagnosis Date   Asthma     History reviewed. No pertinent surgical history.  Family History  Problem Relation Age of Onset   Hypertension Mother    Asthma Mother    Seizures Father    Hypertension Father    Congestive Heart Failure Paternal Grandfather    Asthma Sister    Emphysema Maternal Grandmother    Asthma Sister    Multiple sclerosis Paternal Aunt     Social History Reviewed with no changes to be made today.   Outpatient Medications Prior to Visit  Medication Sig Dispense Refill   albuterol (PROVENTIL) (2.5 MG/3ML) 0.083% nebulizer solution Take 3 mLs by nebulization every 6 (six) hours as needed. 90 mL 3   albuterol (VENTOLIN HFA) 108 (90  Base) MCG/ACT inhaler Inhale 2 puffs into the lungs once every 6 (six) hours as needed. 18 g 1   montelukast (SINGULAIR) 10 MG tablet Take 1 tablet (10 mg total) by mouth at bedtime. 90 tablet 3   omeprazole (PRILOSEC) 20 MG capsule Take 1 capsule (20 mg total) by mouth daily. (Patient not taking: Reported on 03/17/2022) 90 capsule 3    budesonide-formoterol (SYMBICORT) 160-4.5 MCG/ACT inhaler INHALE 2 PUFFS INTO THE LUNGS 2 (TWO) TIMES DAILY. (Patient not taking: Reported on 03/17/2022) 10.2 g 2   cetirizine (ZYRTEC) 10 MG tablet Take 1 tablet (10 mg total) by mouth daily. (Patient not taking: Reported on 03/17/2022) 90 tablet 1   fluticasone (FLONASE) 50 MCG/ACT nasal spray Place 2 sprays into both nostrils daily. (Patient not taking: Reported on 03/17/2022) 16 g 6   meclizine (ANTIVERT) 25 MG tablet Take 1 tablet (25 mg total) by mouth 2 (two) times daily as needed for up to 10 doses for dizziness or nausea. (Patient not taking: Reported on 03/17/2022) 10 tablet 0   meloxicam (MOBIC) 7.5 MG tablet Take 1 tablet (7.5 mg total) by mouth daily. For shoulder pain (Patient not taking: Reported on 03/17/2022) 30 tablet 0   methocarbamol (ROBAXIN) 750 MG tablet Take 1 tablet (750 mg total) by mouth every 8 (eight) hours as needed for muscle spasms. (Patient not taking: Reported on 03/17/2022) 60 tablet 1   traZODone (DESYREL) 100 MG tablet TAKE 1 TABLET (100 MG TOTAL) BY MOUTH ONCE NIGHTLY AT BEDTIME. (Patient not taking: Reported on 03/17/2022) 30 tablet 0   No facility-administered medications prior to visit.    Allergies  Allergen Reactions   Oxycodone Anaphylaxis, Shortness Of Breath and Swelling   Shrimp Flavor    Watermelon Flavor Swelling and Other (See Comments)    Swelling of tongue and lips break out       Objective:    BP 113/77   Pulse 94   Temp 98 F (36.7 C) (Temporal)   Ht 5' 4.25" (1.632 m)   Wt 253 lb 3.2 oz (114.9 kg)   LMP 03/07/2022 (Exact Date)   SpO2 100%   BMI 43.12 kg/m  Wt Readings from Last 3 Encounters:  03/17/22 253 lb 3.2 oz (114.9 kg)  01/08/22 256 lb (116.1 kg)  02/03/21 245 lb (111.1 kg)    Physical Exam Vitals and nursing note reviewed.  Constitutional:      Appearance: She is well-developed.  HENT:     Head: Normocephalic and atraumatic.  Cardiovascular:     Rate and Rhythm:  Normal rate and regular rhythm.     Heart sounds: Normal heart sounds. No murmur heard.    No friction rub. No gallop.  Pulmonary:     Effort: Pulmonary effort is normal. No tachypnea or respiratory distress.     Breath sounds: Normal breath sounds. No decreased breath sounds, wheezing, rhonchi or rales.     Comments: Breath sounds clear throughout Chest:     Chest wall: No tenderness.  Abdominal:     General: Bowel sounds are normal.     Palpations: Abdomen is soft.  Musculoskeletal:        General: Normal range of motion.     Cervical back: Normal range of motion.  Skin:    General: Skin is warm and dry.  Neurological:     Mental Status: She is alert and oriented to person, place, and time.     Coordination: Coordination normal.  Psychiatric:  Behavior: Behavior normal. Behavior is cooperative.        Thought Content: Thought content normal.        Judgment: Judgment normal.          Patient has been counseled extensively about nutrition and exercise as well as the importance of adherence with medications and regular follow-up. The patient was given clear instructions to go to ER or return to medical center if symptoms don't improve, worsen or new problems develop. The patient verbalized understanding.   Follow-up: Return in about 8 weeks (around 05/12/2022) for f/u asthma.   Gildardo Pounds, FNP-BC [facility], [place]   03/17/2022, 1:44 PM

## 2022-03-24 ENCOUNTER — Other Ambulatory Visit: Payer: Self-pay

## 2022-03-25 ENCOUNTER — Other Ambulatory Visit: Payer: Self-pay

## 2022-05-13 ENCOUNTER — Ambulatory Visit: Payer: Self-pay | Admitting: Nurse Practitioner

## 2022-05-24 ENCOUNTER — Emergency Department (HOSPITAL_COMMUNITY)
Admission: EM | Admit: 2022-05-24 | Discharge: 2022-05-24 | Disposition: A | Discharge status: Home / Self Care | Payer: Self-pay | Attending: Emergency Medicine | Admitting: Emergency Medicine

## 2022-05-24 ENCOUNTER — Emergency Department (HOSPITAL_BASED_OUTPATIENT_CLINIC_OR_DEPARTMENT_OTHER): Payer: Self-pay

## 2022-05-24 ENCOUNTER — Encounter (HOSPITAL_COMMUNITY): Payer: Self-pay

## 2022-05-24 ENCOUNTER — Emergency Department (HOSPITAL_COMMUNITY): Payer: Self-pay

## 2022-05-24 ENCOUNTER — Other Ambulatory Visit: Payer: Self-pay

## 2022-05-24 DIAGNOSIS — J4541 Moderate persistent asthma with (acute) exacerbation: Secondary | ICD-10-CM | POA: Insufficient documentation

## 2022-05-24 DIAGNOSIS — J1183 Influenza due to unidentified influenza virus with otitis media: Secondary | ICD-10-CM

## 2022-05-24 DIAGNOSIS — J101 Influenza due to other identified influenza virus with other respiratory manifestations: Secondary | ICD-10-CM | POA: Insufficient documentation

## 2022-05-24 DIAGNOSIS — Z1152 Encounter for screening for COVID-19: Secondary | ICD-10-CM | POA: Insufficient documentation

## 2022-05-24 DIAGNOSIS — M7989 Other specified soft tissue disorders: Secondary | ICD-10-CM

## 2022-05-24 DIAGNOSIS — R55 Syncope and collapse: Secondary | ICD-10-CM | POA: Insufficient documentation

## 2022-05-24 LAB — URINALYSIS, ROUTINE W REFLEX MICROSCOPIC
Bacteria, UA: NONE SEEN
Bilirubin Urine: NEGATIVE
Glucose, UA: NEGATIVE mg/dL
Hgb urine dipstick: NEGATIVE
Ketones, ur: 20 mg/dL — AB
Nitrite: NEGATIVE
Protein, ur: NEGATIVE mg/dL
Specific Gravity, Urine: >1.046 — ABNORMAL HIGH (ref 1.005–1.030)
pH: 6 (ref 5.0–8.0)

## 2022-05-24 LAB — CBC WITH DIFFERENTIAL/PLATELET
Abs Immature Granulocytes: 0.03 10*3/uL (ref 0.00–0.07)
Basophils Absolute: 0 10*3/uL (ref 0.0–0.1)
Basophils Relative: 1 %
Eosinophils Absolute: 0 10*3/uL (ref 0.0–0.5)
Eosinophils Relative: 1 %
HCT: 40.7 % (ref 36.0–46.0)
Hemoglobin: 13.5 g/dL (ref 12.0–15.0)
Immature Granulocytes: 1 %
Lymphocytes Relative: 15 %
Lymphs Abs: 1 10*3/uL (ref 0.7–4.0)
MCH: 31.8 pg (ref 26.0–34.0)
MCHC: 33.2 g/dL (ref 30.0–36.0)
MCV: 95.8 fL (ref 80.0–100.0)
Monocytes Absolute: 0.5 10*3/uL (ref 0.1–1.0)
Monocytes Relative: 8 %
Neutro Abs: 5 10*3/uL (ref 1.7–7.7)
Neutrophils Relative %: 74 %
Platelets: 297 10*3/uL (ref 150–400)
RBC: 4.25 MIL/uL (ref 3.87–5.11)
RDW: 13 % (ref 11.5–15.5)
WBC: 6.6 10*3/uL (ref 4.0–10.5)
nRBC: 0 % (ref 0.0–0.2)

## 2022-05-24 LAB — COMPREHENSIVE METABOLIC PANEL
ALT: 14 U/L (ref 0–44)
AST: 20 U/L (ref 15–41)
Albumin: 4.3 g/dL (ref 3.5–5.0)
Alkaline Phosphatase: 70 U/L (ref 38–126)
Anion gap: 11 (ref 5–15)
BUN: 8 mg/dL (ref 6–20)
CO2: 19 mmol/L — ABNORMAL LOW (ref 22–32)
Calcium: 8.8 mg/dL — ABNORMAL LOW (ref 8.9–10.3)
Chloride: 104 mmol/L (ref 98–111)
Creatinine, Ser: 0.74 mg/dL (ref 0.44–1.00)
GFR, Estimated: >60 mL/min (ref >60)
Glucose, Bld: 94 mg/dL (ref 70–99)
Potassium: 3.4 mmol/L — ABNORMAL LOW (ref 3.5–5.1)
Sodium: 134 mmol/L — ABNORMAL LOW (ref 135–145)
Total Bilirubin: 0.2 mg/dL — ABNORMAL LOW (ref 0.3–1.2)
Total Protein: 8.4 g/dL — ABNORMAL HIGH (ref 6.5–8.1)

## 2022-05-24 LAB — RESP PANEL BY RT-PCR (RSV, FLU A&B, COVID)  RVPGX2
Influenza A by PCR: POSITIVE — AB
Influenza B by PCR: NEGATIVE
Resp Syncytial Virus by PCR: NEGATIVE
SARS Coronavirus 2 by RT PCR: NEGATIVE

## 2022-05-24 LAB — I-STAT BETA HCG BLOOD, ED (MC, WL, AP ONLY): I-stat hCG, quantitative: <5 m[IU]/mL (ref <5)

## 2022-05-24 LAB — CBG MONITORING, ED: Glucose-Capillary: 99 mg/dL (ref 70–99)

## 2022-05-24 MED ORDER — BENZONATATE 100 MG PO CAPS
100.0000 mg | ORAL_CAPSULE | Freq: Three times a day (TID) | ORAL | 0 refills | Status: DC
  Filled 2022-05-24: qty 21, 7d supply, fill #0

## 2022-05-24 MED ORDER — IOHEXOL 350 MG/ML SOLN
100.0000 mL | Freq: Once | INTRAVENOUS | Status: AC | PRN
  Administered 2022-05-24: 100 mL via INTRAVENOUS

## 2022-05-24 MED ORDER — METHYLPREDNISOLONE SODIUM SUCC 125 MG IJ SOLR: 125.0000 mg | INTRAMUSCULAR | Status: AC

## 2022-05-24 MED ORDER — ALBUTEROL SULFATE HFA 108 (90 BASE) MCG/ACT IN AERS
1.0000 | INHALATION_SPRAY | RESPIRATORY_TRACT | Status: DC | PRN
  Administered 2022-05-24: 2 via RESPIRATORY_TRACT
  Filled 2022-05-24 (×2): qty 6.7

## 2022-05-24 MED ORDER — ALBUTEROL SULFATE (2.5 MG/3ML) 0.083% IN NEBU
10.0000 mg | INHALATION_SOLUTION | Freq: Once | RESPIRATORY_TRACT | Status: AC
  Administered 2022-05-24: 10 mg via RESPIRATORY_TRACT

## 2022-05-24 MED ORDER — PREDNISONE 20 MG PO TABS: 60.0000 mg | ORAL_TABLET | Freq: Once | ORAL | Status: DC

## 2022-05-24 MED ORDER — PREDNISONE 20 MG PO TABS
ORAL_TABLET | ORAL | 0 refills | Status: DC
  Filled 2022-05-24: qty 11, 5d supply, fill #0

## 2022-05-24 MED ORDER — METHYLPREDNISOLONE SODIUM SUCC 125 MG IJ SOLR
INTRAMUSCULAR | Status: AC
  Administered 2022-05-24: 125 mg via INTRAVENOUS
  Filled 2022-05-24: qty 2

## 2022-05-24 MED ORDER — MAGNESIUM SULFATE 2 GM/50ML IV SOLN: 2.0000 g | Freq: Once | INTRAVENOUS | Status: AC

## 2022-05-24 MED ORDER — EPINEPHRINE 0.3 MG/0.3ML IJ SOAJ
0.3000 mg | INTRAMUSCULAR | 0 refills | Status: DC | PRN
  Filled 2022-05-24 – 2022-05-25 (×2): qty 2, fill #0

## 2022-05-24 MED ORDER — EPINEPHRINE 0.3 MG/0.3ML IJ SOAJ
INTRAMUSCULAR | Status: AC
  Filled 2022-05-24: qty 0.3

## 2022-05-24 MED ORDER — MAGNESIUM SULFATE 2 GM/50ML IV SOLN
INTRAVENOUS | Status: AC
  Administered 2022-05-24: 2 g via INTRAVENOUS
  Filled 2022-05-24: qty 50

## 2022-05-24 MED ORDER — IPRATROPIUM-ALBUTEROL 0.5-2.5 (3) MG/3ML IN SOLN
3.0000 mL | Freq: Once | RESPIRATORY_TRACT | Status: AC
  Administered 2022-05-24: 3 mL via RESPIRATORY_TRACT

## 2022-05-24 MED ORDER — EPINEPHRINE 0.3 MG/0.3ML IJ SOAJ
0.3000 mg | Freq: Once | INTRAMUSCULAR | Status: AC
  Administered 2022-05-24: 0.3 mg via INTRAMUSCULAR

## 2022-05-24 NOTE — ED Notes (Signed)
Patient is responsive to MD and answering questions, suction provided

## 2022-05-24 NOTE — Progress Notes (Signed)
VASCULAR LAB    Left upper extremity venous duplex has been performed.  See CV proc for preliminary results.  Messaged negative results to Domenic Moras, PA-C and Dr. Maurie Boettcher, [facility], RVT 05/24/2022, 5:04 PM

## 2022-05-24 NOTE — ED Notes (Signed)
Patient arrives to RES A unresponsive, pulse noted with rapid respirations, Nystagmus noted to eyes per charge nurse.   Patient placed in bed, on monitor, IV inserted, and MD in room

## 2022-05-24 NOTE — ED Provider Notes (Signed)
[facility]-EMERGENCY DEPT Provider Note   CSN: [id number] Arrival date & time: 05/24/22  1254     History  Chief Complaint  Patient presents with   Shortness of Breath   asthma    Paula Wiggins is a 32 y.o. female.  The history is provided by the patient and medical records. No language interpreter was used.  Shortness of Breath    32 year old female significant history persistent severe asthma, GERD, presenting to ED with complaint of shortness of breath.  Patient report for the past 5 days she has had shortness of breath, wheezes, and nonproductive cough.  She also complaining of pain to her right upper extremity.  When she arrived to the ER for evaluation, she has a witnessed syncopal episode and was promptly brought to the back to be evaluated.  Currently patient reports feeling flushed and hot along with chest tightness.  She denies any prior history of PE or DVT.  She is not on any oral hormone denies any recent travel.  History at this time is limited due to her condition.  Home Medications Prior to Admission medications   Medication Sig Start Date End Date Taking? Authorizing Provider  albuterol (PROVENTIL) (2.5 MG/3ML) 0.083% nebulizer solution Take 3 mLs by nebulization every 6 (six) hours as needed. 03/17/22 03/17/23  Claiborne Rigg, NP  albuterol (VENTOLIN HFA) 108 (90 Base) MCG/ACT inhaler Inhale 2 puffs into the lungs once every 6 (six) hours as needed. 03/17/22   Claiborne Rigg, NP  amitriptyline (ELAVIL) 10 MG tablet Take 1 tablet (10 mg total) by mouth at bedtime. For headaches and depression 03/17/22   Claiborne Rigg, NP  cetirizine (ZYRTEC) 10 MG tablet Take 1 tablet (10 mg total) by mouth daily. 03/17/22   Claiborne Rigg, NP  fluticasone-salmeterol (ADVAIR HFA) [phone] MCG/ACT inhaler Inhale 2 puffs into the lungs 2 (two) times daily. NEEDS PASS 03/17/22   Claiborne Rigg, NP  montelukast (SINGULAIR) 10 MG tablet Take 1 tablet (10 mg  total) by mouth at bedtime. 03/17/22   Claiborne Rigg, NP  omeprazole (PRILOSEC) 20 MG capsule Take 1 capsule (20 mg total) by mouth daily. Patient not taking: Reported on 03/17/2022 02/03/21 05/04/21  Claiborne Rigg, NP      Allergies    Oxycodone, Shrimp flavor, and Watermelon flavor    Review of Systems   Review of Systems  Respiratory:  Positive for shortness of breath.   All other systems reviewed and are negative.   Physical Exam Updated Vital Signs BP (!) 136/110 (BP Location: Left Arm)   Pulse (!) 121   Temp 98.9 F (37.2 C) (Oral)   Resp (!) 30   Ht 5\' 3"  (1.6 m)   Wt 109.3 kg   LMP 05/11/2022   SpO2 99%   BMI 42.69 kg/m  Physical Exam Vitals and nursing note reviewed.  Constitutional:      Appearance: She is well-developed. She is obese.     Comments: Obese female appears anxious and diaphoretic.  HENT:     Head: Normocephalic and atraumatic.  Eyes:     Conjunctiva/sclera: Conjunctivae normal.  Cardiovascular:     Rate and Rhythm: Tachycardia present.  Pulmonary:     Effort: Tachypnea present.     Breath sounds: No stridor.     Comments: Decreased breath sounds without any overt wheezes, rales, or rhonchi. Chest:     Chest wall: No tenderness.  Abdominal:     Palpations: Abdomen  is soft.  Musculoskeletal:     Cervical back: Neck supple.     Right lower leg: No edema.     Left lower leg: No edema.     Comments: No significant edema noted to bilateral upper and lower extremities.  Skin:    Findings: No rash.  Neurological:     Mental Status: She is alert. She is disoriented.  Psychiatric:        Mood and Affect: Mood normal.     ED Results / Procedures / Treatments   Labs (all labs ordered are listed, but only abnormal results are displayed) Labs Reviewed  RESP PANEL BY RT-PCR (RSV, FLU A&B, COVID)  RVPGX2 - Abnormal; Notable for the following components:      Result Value   Influenza A by PCR POSITIVE (*)    All other components within  normal limits  COMPREHENSIVE METABOLIC PANEL - Abnormal; Notable for the following components:   Sodium 134 (*)    Potassium 3.4 (*)    CO2 19 (*)    Calcium 8.8 (*)    Total Protein 8.4 (*)    Total Bilirubin 0.2 (*)    All other components within normal limits  CBC WITH DIFFERENTIAL/PLATELET  URINALYSIS, ROUTINE W REFLEX MICROSCOPIC  I-STAT BETA HCG BLOOD, ED ([facility], [facility], [facility] ONLY)  CBG MONITORING, ED    EKG EKG Interpretation  Date/Time:  Sunday May 24 2022 13:22:51 EST Ventricular Rate:  118 PR Interval:  113 QRS Duration: 72 QT Interval:  296 QTC Calculation: 415 R Axis:   65 Text Interpretation: Sinus tachycardia Baseline wander Confirmed by Cathren Laine ([id number]) on 05/24/2022 2:35:22 PM  Radiology UE VENOUS DUPLEX (7am - 7pm)  Result Date: 05/24/2022 UPPER VENOUS STUDY  Patient Name:  Paula Wiggins [facility]  Date of Exam:   05/24/2022 Medical Rec #: [id number]          Accession #:    [id number] Date of Birth: 08/09/1990          Patient Gender: F Patient Age:   65 years Exam Location:  [facility] Procedure:      VAS [place] UPPER EXTREMITY VENOUS DUPLEX Referring Phys: Fayrene Helper --------------------------------------------------------------------------------  Indications: Swelling, and Flu Limitations: Bandages, line and positioning. Comparison Study: No prior study on file Performing Technologist: Sherren Kerns RVS  Examination Guidelines: A complete evaluation includes B-mode imaging, spectral Doppler, color Doppler, and power Doppler as needed of all accessible portions of each vessel. Bilateral testing is considered an integral part of a complete examination. Limited examinations for reoccurring indications may be performed as noted.  Right Findings: +----------+------------+---------+-----------+----------+---------------+ RIGHT     CompressiblePhasicitySpontaneousProperties    Summary     +----------+------------+---------+-----------+----------+---------------+ IJV            Full       Yes       Yes                              +----------+------------+---------+-----------+----------+---------------+ Subclavian               Yes       Yes                              +----------+------------+---------+-----------+----------+---------------+ Axillary  Not visualized  +----------+------------+---------+-----------+----------+---------------+ Brachial                                            patent by color +----------+------------+---------+-----------+----------+---------------+ Radial        Full                                                  +----------+------------+---------+-----------+----------+---------------+ Ulnar         Full                                                  +----------+------------+---------+-----------+----------+---------------+ Cephalic      Full                                                  +----------+------------+---------+-----------+----------+---------------+ Basilic       Full                                                  +----------+------------+---------+-----------+----------+---------------+  Summary:  Right: No evidence of deep vein thrombosis in the upper extremity. No evidence of superficial vein thrombosis in the upper extremity.  *See table(s) above for measurements and observations.    Preliminary    CT Angio Chest PE W and/or Wo Contrast  Result Date: 05/24/2022 CLINICAL DATA:  Pulmonary embolism (PE) suspected, high prob EXAM: CT ANGIOGRAPHY CHEST WITH CONTRAST TECHNIQUE: Multidetector CT imaging of the chest was performed using the standard protocol during bolus administration of intravenous contrast. Multiplanar CT image reconstructions and MIPs were obtained to evaluate the vascular anatomy. RADIATION DOSE REDUCTION: This exam was performed according to the departmental dose-optimization program which includes automated  exposure control, adjustment of the mA and/or kV according to patient size and/or use of iterative reconstruction technique. CONTRAST:  100 mL OMNIPAQUE IOHEXOL 350 MG/ML SOLNnon-ionic IV contrast. COMPARISON:  12/26/2010 FINDINGS: Cardiovascular: Satisfactory opacification of the pulmonary arteries to the segmental level. No evidence of pulmonary embolism. Normal heart size. No pericardial effusion. Mediastinum/Nodes: No enlarged mediastinal, hilar, or axillary lymph nodes. Thyroid gland, trachea, and esophagus demonstrate no significant findings. Lungs/Pleura: Lungs are clear. No pleural effusion or pneumothorax. Upper Abdomen: No acute abnormality. Musculoskeletal: No chest wall abnormality. No acute or significant osseous findings. Review of the MIP images confirms the above findings. IMPRESSION: No evidence of PE, aneurysm or dissection. Electronically Signed   By: Layla Maw M.D.   On: 05/24/2022 16:37   DG Chest 2 View  Result Date: 05/24/2022 CLINICAL DATA:  Chest pain EXAM: CHEST - 2 VIEW COMPARISON:  06/18/2020 FINDINGS: Heart size and mediastinal contours are normal. There is no pleural effusion or edema identified. No airspace opacities identified. Visualized osseous structures are unremarkable. IMPRESSION: No active cardiopulmonary disease. Electronically Signed   By: Signa Kell M.D.   On: 05/24/2022 14:17  Procedures .Critical Care  Performed by: Fayrene Helper, PA-C Authorized by: Fayrene Helper, PA-C   Critical care provider statement:    Critical care time (minutes):  30   Critical care was time spent personally by me on the following activities:  Development of treatment plan with patient or surrogate, discussions with consultants, evaluation of patient's response to treatment, examination of patient, ordering and review of laboratory studies, ordering and review of radiographic studies, ordering and performing treatments and interventions, pulse oximetry, re-evaluation of  patient's condition and review of old charts     Medications Ordered in ED Medications  albuterol (VENTOLIN HFA) 108 (90 Base) MCG/ACT inhaler 1-2 puff (has no administration in time range)  predniSONE (DELTASONE) tablet 60 mg (has no administration in time range)    ED Course/ Medical Decision Making/ A&P                           Medical Decision Making  BP (!) 136/110 (BP Location: Left Arm)   Pulse (!) 121   Temp 98.9 F (37.2 C) (Oral)   Resp (!) 30   Ht 5\' 3"  (1.6 m)   Wt 109.3 kg   LMP 05/11/2022   SpO2 99%   BMI 42.69 kg/m   39:59 PM  32 year old female significant history persistent severe asthma, GERD, presenting to ED with complaint of shortness of breath.  Patient report for the past 5 days she has had shortness of breath, wheezes, and nonproductive cough.  She also complaining of pain to her right upper extremity.  When she arrived to the ER for evaluation, she has a witnessed syncopal episode and was promptly brought to the back to be evaluated.  Currently patient reports feeling flushed and hot along with chest tightness.  She denies any prior history of PE or DVT.  She is not on any oral hormone denies any recent travel.  History at this time is limited due to her condition.  On exam this is an obese female laying in bed appears anxious.  She has transient syncopal event while she was in the triage room receiving her blood work.  This could be a vasovagal component from get her blood work however due to concerns of potential PE causing syncope, will obtain chest CTA as well as ultrasound of right upper extremity.  Patient was also given Solu-Medrol, EpiPen, and continuous nebulizer for further managements of her respiratory discomfort.  3:52 PM On reassessment patient appears much more comfortable and in no acute respiratory discomfort.  Labs obtained and reviewed interpreted by me and is remarkable for positive influenza A.  Imaging obtained independently viewed and  interpreted by me and I agree with radiologist's interpretation.  Chest x-ray showed no acute cardiopulmonary disease .  Chest CTA is pending.  5:15 PM -Labs ordered, independently viewed and interpreted by me.  Labs remarkable for Positive Flu A.   -The patient was maintained on a cardiac monitor.  I personally viewed and interpreted the cardiac monitored which showed an underlying rhythm of: sinus tachycardia -Imaging independently viewed and interpreted by me and I agree with radiologist's interpretation.  Result remarkable for Chest CTA without evidence of PE or PNA.  Vascular 38 of RLE without DVT or SVT.  -This patient presents to the ED for concern of sob, this involves an extensive number of treatment options, and is a complaint that carries with it a high risk of complications and morbidity.  The differential diagnosis  includes asthma exacerbation, pna, PE, anemia -Co morbidities that complicate the patient evaluation includes asthma -Treatment includes albuterol nebs, solumedrol, epinephrine, magnesium -Reevaluation of the patient after these medicines showed that the patient improved -PCP office notes or outside notes reviewed -Discussion with specialist attending Dr. Billy Fischer who also was involved in pt care.   -Escalation to admission/observation considered: patients feels much better, is comfortable with discharge, and will follow up with PCP -Prescription medication considered, patient comfortable with epipen, tessalon, prednisone -Social Determinant of Health considered which includes depression, tobacco use  5:23 PM Patient has been monitored in the ED for the past 4 hours with improvement of symptoms.  Chest CT angiogram without any evidence of PE or pneumonia.  No vascular ultrasound without evidence of DVT.  On reassessment, improve lung aeration.  Patient states she felt improvement of asthma exacerbation with EpiPen and request to have 1 available to use as needed in the  event if she experience significant shortness of breath, anaphylactic symptoms.  I agree with patient and will prescribe EpiPen to have available.  I discussed with patient appropriate use of the medication such as shortness of breath and urticaria from allergic reaction.  I suspect her syncope is likely vasovagal.  I have considered hospital admission for observation but patient prefers going home.  She mention she does have her asthma medication at home that she can use.  She is outside the window for Tamiflu so I recommend supportive care.  I gave patient strict return precaution.  She was understanding agrees with plan.  Patient otherwise stable to be discharged.         Final Clinical Impression(s) / ED Diagnoses Final diagnoses:  Influenza A  Moderate persistent asthma with exacerbation  Vasovagal syncope    Rx / DC Orders ED Discharge Orders          Ordered    EPINEPHrine 0.3 mg/0.3 mL IJ SOAJ injection  As needed        05/24/22 1727    predniSONE (DELTASONE) 20 MG tablet        05/24/22 1728    benzonatate (TESSALON) 100 MG capsule  Every 8 hours        05/24/22 1728              Domenic Moras, PA-C 05/24/22 1750    Lajean Saver, MD 05/28/22 1609

## 2022-05-24 NOTE — ED Provider Triage Note (Signed)
Emergency Medicine Provider Triage Evaluation Note  Paula Wiggins , a 32 y.o. female  was evaluated in triage.  Pt complains of shortness of breath, chest tightness.  Patient states that symptoms of the wrist got worse since Tuesday of this week.  States she has been trying to use her inhaler with some relief of symptoms.  Reports some congestion, cough associated.  Reports history of asthma exacerbations and states this feels similarly.  Reports some subjective fever/chills at home.  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  Denies abdominal pain nausea, vomiting, urinary/vaginal symptoms, change in bowel habits.  Denies PE/DVT history.  Review of Systems  Positive: See above Negative:   Physical Exam  BP (!) 136/110 (BP Location: Left Arm)   Pulse (!) 121   Temp 98.9 F (37.2 C) (Oral)   Resp (!) 30   Ht 5\' 3"  (1.6 m)   Wt 109.3 kg   LMP 05/11/2022   SpO2 99%   BMI 42.69 kg/m  Gen:   Awake, no distress   Resp:  Normal effort  MSK:   Moves extremities without difficulty  Other:  Patient with tachypnea, tachycardia.  Shallow breathing appreciated with mild wheeze bilaterally  Medical Decision Making  Medically screening exam initiated at 1:53 PM.  Appropriate orders placed.  Paula Wiggins was informed that the remainder of the evaluation will be completed by another provider, this initial triage assessment does not replace that evaluation, and the importance of remaining in the ED until their evaluation is complete.     Wilnette Kales, [place] 05/24/22 1401

## 2022-05-24 NOTE — ED Notes (Signed)
2g mag and 125mg  soul-medrol admin, md in room

## 2022-05-24 NOTE — ED Notes (Signed)
Epi admin, respiratory at bedside

## 2022-05-24 NOTE — Discharge Instructions (Addendum)
You have been evaluated for your symptoms.  Your symptoms likely due to an asthma exacerbation in the setting of influenza A infection.  Please resume taking your asthma medication at home.  If you develop worsening shortness of breath, or have any concerns do not hesitate to return to the ER for further managements of your condition.

## 2022-05-24 NOTE — ED Triage Notes (Addendum)
Patient c/o asthma, and SOB x 5 days patient states she has been using her Albuterol nebs and inhaler with no relief. Patient states her chest "burns" when she coughs.  Noted that the patient had right arm swelling. Patient denies knowing when this occurred. Patient became lethargic and clammy while talking to the patient. Patient did nt respond to sternal rub.

## 2022-05-25 ENCOUNTER — Other Ambulatory Visit: Payer: Self-pay

## 2022-06-01 ENCOUNTER — Other Ambulatory Visit: Payer: Self-pay

## 2022-07-07 ENCOUNTER — Other Ambulatory Visit: Payer: Self-pay

## 2022-08-05 ENCOUNTER — Other Ambulatory Visit: Payer: Self-pay

## 2022-08-05 ENCOUNTER — Encounter: Payer: Self-pay | Admitting: Nurse Practitioner

## 2022-08-05 ENCOUNTER — Ambulatory Visit: Payer: Self-pay | Attending: Nurse Practitioner | Admitting: Nurse Practitioner

## 2022-08-05 ENCOUNTER — Other Ambulatory Visit: Payer: Self-pay | Admitting: Pharmacist

## 2022-08-05 VITALS — BP 107/70 | HR 100 | Ht 63.0 in | Wt 258.4 lb

## 2022-08-05 DIAGNOSIS — J4520 Mild intermittent asthma, uncomplicated: Secondary | ICD-10-CM

## 2022-08-05 DIAGNOSIS — K5909 Other constipation: Secondary | ICD-10-CM

## 2022-08-05 DIAGNOSIS — M65331 Trigger finger, right middle finger: Secondary | ICD-10-CM

## 2022-08-05 DIAGNOSIS — Z1159 Encounter for screening for other viral diseases: Secondary | ICD-10-CM

## 2022-08-05 DIAGNOSIS — K219 Gastro-esophageal reflux disease without esophagitis: Secondary | ICD-10-CM

## 2022-08-05 DIAGNOSIS — R42 Dizziness and giddiness: Secondary | ICD-10-CM

## 2022-08-05 DIAGNOSIS — R221 Localized swelling, mass and lump, neck: Secondary | ICD-10-CM

## 2022-08-05 DIAGNOSIS — G44229 Chronic tension-type headache, not intractable: Secondary | ICD-10-CM

## 2022-08-05 DIAGNOSIS — Z0000 Encounter for general adult medical examination without abnormal findings: Secondary | ICD-10-CM

## 2022-08-05 MED ORDER — FLUTICASONE-SALMETEROL 500-50 MCG/ACT IN AEPB
1.0000 | INHALATION_SPRAY | Freq: Two times a day (BID) | RESPIRATORY_TRACT | 6 refills | Status: DC
  Filled 2022-08-05: qty 60, 30d supply, fill #0

## 2022-08-05 MED ORDER — ALBUTEROL SULFATE (2.5 MG/3ML) 0.083% IN NEBU
3.0000 mL | INHALATION_SOLUTION | Freq: Four times a day (QID) | RESPIRATORY_TRACT | 3 refills | Status: DC | PRN
  Filled 2022-08-05: qty 90, 8d supply, fill #0

## 2022-08-05 MED ORDER — SENNOSIDES-DOCUSATE SODIUM 8.6-50 MG PO TABS
2.0000 | ORAL_TABLET | Freq: Every day | ORAL | 1 refills | Status: AC
  Filled 2022-08-05: qty 180, 90d supply, fill #0

## 2022-08-05 MED ORDER — MONTELUKAST SODIUM 10 MG PO TABS
10.0000 mg | ORAL_TABLET | Freq: Every day | ORAL | 3 refills | Status: AC
  Filled 2022-08-05: qty 90, 90d supply, fill #0

## 2022-08-05 MED ORDER — CETIRIZINE HCL 10 MG PO TABS
10.0000 mg | ORAL_TABLET | Freq: Every day | ORAL | 3 refills | Status: AC
  Filled 2022-08-05: qty 90, 90d supply, fill #0

## 2022-08-05 MED ORDER — AMITRIPTYLINE HCL 10 MG PO TABS
10.0000 mg | ORAL_TABLET | Freq: Every day | ORAL | 1 refills | Status: AC
  Filled 2022-08-05: qty 90, 90d supply, fill #0

## 2022-08-05 MED ORDER — FLUTICASONE-SALMETEROL 230-21 MCG/ACT IN AERO
2.0000 | INHALATION_SPRAY | Freq: Two times a day (BID) | RESPIRATORY_TRACT | 12 refills | Status: DC
  Filled 2022-08-05: qty 12, fill #0

## 2022-08-05 MED ORDER — ALBUTEROL SULFATE HFA 108 (90 BASE) MCG/ACT IN AERS
2.0000 | INHALATION_SPRAY | Freq: Four times a day (QID) | RESPIRATORY_TRACT | 1 refills | Status: DC | PRN
  Filled 2022-08-05: qty 6.7, 25d supply, fill #0

## 2022-08-05 MED ORDER — EPINEPHRINE 0.3 MG/0.3ML IJ SOAJ
0.3000 mg | INTRAMUSCULAR | 0 refills | Status: DC | PRN
  Filled 2022-08-05: qty 1, fill #0

## 2022-08-05 MED ORDER — OMEPRAZOLE 20 MG PO CPDR
20.0000 mg | DELAYED_RELEASE_CAPSULE | Freq: Every day | ORAL | 3 refills | Status: AC
  Filled 2022-08-05: qty 90, 90d supply, fill #0

## 2022-08-05 NOTE — Progress Notes (Signed)
Assessment & Plan:  Paula Wiggins was seen today for annual exam.  Diagnoses and all orders for this visit:  Encounter for annual physical exam -     CMP14+EGFR  Need for hepatitis C screening test -     HCV Ab w Reflex to Quant PCR  Mild intermittent asthma in adult without complication -     Discontinue: fluticasone-salmeterol (ADVAIR HFA) 230-21 MCG/ACT inhaler; Inhale 2 puffs into the lungs 2 (two) times daily. NEEDS PASS -     EPINEPHrine 0.3 mg/0.3 mL IJ SOAJ injection; Inject 0.3 mg into the muscle as needed for anaphylaxis. -     albuterol (PROVENTIL) (2.5 MG/3ML) 0.083% nebulizer solution; Take 3 mLs by nebulization every 6 (six) hours as needed. -     albuterol (VENTOLIN HFA) 108 (90 Base) MCG/ACT inhaler; Inhale 2 puffs into the lungs once every 6 (six) hours as needed. -     cetirizine (ZYRTEC) 10 MG tablet; Take 1 tablet (10 mg total) by mouth daily. -     montelukast (SINGULAIR) 10 MG tablet; Take 1 tablet (10 mg total) by mouth at bedtime.  Chronic tension-type headache, not intractable -     amitriptyline (ELAVIL) 10 MG tablet; Take 1 tablet (10 mg total) by mouth at bedtime. For headaches and depression  GERD without esophagitis -     omeprazole (PRILOSEC) 20 MG capsule; Take 1 capsule (20 mg total) by mouth daily.  Dizziness -     Thyroid Panel With TSH -     US THYROID; Future -     Urinalysis, Complete  Neck fullness -     US THYROID; Future  Trigger middle finger of right hand -     Ambulatory referral to Hand Surgery    Patient has been counseled on age-appropriate routine health concerns for screening and prevention. These are reviewed and up-to-date. Referrals have been placed accordingly. Immunizations are up-to-date or declined.    Subjective:   Chief Complaint  Patient presents with   Annual Exam   HPI Paula Wiggins 32 y.o. female presents to office today for annual physical  Vertigo Patient presents for evaluation of dizziness. The  symptoms started several months ago and have been well-controlled. The attacks occur infrequently and last a few minutes. Positions that worsen symptoms: none. Previous workup/treatments: none. Associated ear symptoms: none. Associated CNS symptoms: visual floaters. Recent infections: flu. Head trauma: denied. Drug ingestion: none. Noise exposure: no occupational exposure. She is not a smoker She has not had a recent eye exam. Does not drink water often as she does not like the way it tastes.    Review of Systems  Constitutional:  Negative for fever, malaise/fatigue and weight loss.  HENT: Negative.  Negative for nosebleeds.   Eyes: Negative.  Negative for blurred vision, double vision and photophobia.  Respiratory:  Positive for shortness of breath. Negative for cough and wheezing.   Cardiovascular: Negative.  Negative for chest pain, palpitations and leg swelling.  Gastrointestinal:  Positive for constipation and heartburn. Negative for abdominal pain, blood in stool, diarrhea, melena, nausea and vomiting.  Genitourinary: Negative.   Musculoskeletal:  Positive for joint pain. Negative for myalgias.  Skin: Negative.   Neurological:  Positive for dizziness. Negative for focal weakness, seizures and headaches.  Endo/Heme/Allergies: Negative.   Psychiatric/Behavioral: Negative.  Negative for suicidal ideas.     Past Medical History:  Diagnosis Date   Asthma     History reviewed. No pertinent surgical history.  Family History  Problem Relation Age of Onset   Hypertension Mother    Asthma Mother    Seizures Father    Hypertension Father    Congestive Heart Failure Paternal Grandfather    Asthma Sister    Emphysema Maternal Grandmother    Asthma Sister    Multiple sclerosis Paternal Aunt     Social History Reviewed with no changes to be made today.   Outpatient Medications Prior to Visit  Medication Sig Dispense Refill   benzonatate (TESSALON) 100 MG capsule Take 1 capsule (100  mg total) by mouth every 8 (eight) hours. 21 capsule 0   albuterol (PROVENTIL) (2.5 MG/3ML) 0.083% nebulizer solution Take 3 mLs by nebulization every 6 (six) hours as needed. 90 mL 3   albuterol (VENTOLIN HFA) 108 (90 Base) MCG/ACT inhaler Inhale 2 puffs into the lungs once every 6 (six) hours as needed. 6.7 g 1   amitriptyline (ELAVIL) 10 MG tablet Take 1 tablet (10 mg total) by mouth at bedtime. For headaches and depression 90 tablet 1   cetirizine (ZYRTEC) 10 MG tablet Take 1 tablet (10 mg total) by mouth daily. 90 tablet 3   EPINEPHrine 0.3 mg/0.3 mL IJ SOAJ injection Inject 0.3 mg into the muscle as needed for anaphylaxis. 1 each 0   fluticasone-salmeterol (ADVAIR HFA) 230-21 MCG/ACT inhaler Inhale 2 puffs into the lungs 2 (two) times daily. NEEDS PASS 12 g 12   montelukast (SINGULAIR) 10 MG tablet Take 1 tablet (10 mg total) by mouth at bedtime. 90 tablet 3   omeprazole (PRILOSEC) 20 MG capsule Take 1 capsule (20 mg total) by mouth daily. (Patient not taking: Reported on 03/17/2022) 90 capsule 3   predniSONE (DELTASONE) 20 MG tablet take 3 tablets by mouth on day one, then 2 tablets daily for 4 days 11 tablet 0   No facility-administered medications prior to visit.    Allergies  Allergen Reactions   Oxycodone Anaphylaxis, Shortness Of Breath and Swelling   Shrimp Flavor    Watermelon Flavor Swelling and Other (See Comments)    Swelling of tongue and lips break out       Objective:    BP 107/70   Pulse 100   Ht 5\' 3"  (1.6 m)   Wt 258 lb 6.4 oz (117.2 kg)   LMP 07/22/2022 (Exact Date)   SpO2 100%   BMI 45.77 kg/m  Wt Readings from Last 3 Encounters:  08/05/22 258 lb 6.4 oz (117.2 kg)  05/24/22 241 lb (109.3 kg)  03/17/22 253 lb 3.2 oz (114.9 kg)    Physical Exam Constitutional:      Appearance: She is well-developed.  HENT:     Head: Normocephalic and atraumatic.     Right Ear: Hearing, tympanic membrane, ear canal and external ear normal.     Left Ear: Hearing,  tympanic membrane, ear canal and external ear normal.     Nose: Nose normal.     Right Turbinates: Not enlarged.     Left Turbinates: Not enlarged.     Mouth/Throat:     Lips: Pink.     Mouth: Mucous membranes are moist.     Dentition: No dental tenderness, gingival swelling, dental abscesses or gum lesions.     Pharynx: No oropharyngeal exudate.  Eyes:     General: No scleral icterus.       Right eye: No discharge.     Extraocular Movements: Extraocular movements intact.     Conjunctiva/sclera: Conjunctivae normal.     Pupils: Pupils are  equal, round, and reactive to light.  Neck:     Thyroid: No thyromegaly.     Trachea: No tracheal deviation.  Cardiovascular:     Rate and Rhythm: Normal rate and regular rhythm.     Heart sounds: Normal heart sounds. No murmur heard.    No friction rub.  Pulmonary:     Effort: Pulmonary effort is normal. No accessory muscle usage or respiratory distress.     Breath sounds: Normal breath sounds. No decreased breath sounds, wheezing, rhonchi or rales.  Abdominal:     General: Bowel sounds are normal. There is no distension.     Palpations: Abdomen is soft. There is no mass.     Tenderness: There is no abdominal tenderness. There is no right CVA tenderness, left CVA tenderness, guarding or rebound.     Hernia: No hernia is present.  Musculoskeletal:        General: No tenderness. Normal range of motion.     Right hand: Deformity (trigger finger) present.     Cervical back: Normal range of motion and neck supple.  Lymphadenopathy:     Cervical: No cervical adenopathy.  Skin:    General: Skin is warm and dry.     Findings: No erythema.  Neurological:     Mental Status: She is alert and oriented to person, place, and time.     Cranial Nerves: No cranial nerve deficit.     Motor: Motor function is intact.     Coordination: Coordination is intact. Coordination normal.     Gait: Gait is intact.     Deep Tendon Reflexes:     Reflex Scores:       Patellar reflexes are 1+ on the right side and 1+ on the left side. Psychiatric:        Attention and Perception: Attention normal.        Mood and Affect: Mood normal.        Speech: Speech normal.        Behavior: Behavior normal.        Thought Content: Thought content normal.        Judgment: Judgment normal.          Patient has been counseled extensively about nutrition and exercise as well as the importance of adherence with medications and regular follow-up. The patient was given clear instructions to go to ER or return to medical center if symptoms don't improve, worsen or new problems develop. The patient verbalized understanding.   Follow-up: Return if symptoms worsen or fail to improve.   Gildardo Pounds, FNP-BC [facility], [place]   08/05/2022, 4:13 PM

## 2022-08-05 NOTE — Progress Notes (Signed)
Switch made per auto-sub.

## 2022-08-06 LAB — URINALYSIS, COMPLETE
Bilirubin, UA: NEGATIVE
Glucose, UA: NEGATIVE
Nitrite, UA: NEGATIVE
Protein,UA: NEGATIVE
RBC, UA: NEGATIVE
Specific Gravity, UA: >=1.03 — AB (ref 1.005–1.030)
Urobilinogen, Ur: 1 mg/dL (ref 0.2–1.0)
pH, UA: 6.5 (ref 5.0–7.5)

## 2022-08-06 LAB — CMP14+EGFR
ALT: 8 IU/L (ref 0–32)
AST: 11 IU/L (ref 0–40)
Albumin/Globulin Ratio: 1.6 (ref 1.2–2.2)
Albumin: 4.5 g/dL (ref 3.9–4.9)
Alkaline Phosphatase: 105 IU/L (ref 44–121)
BUN/Creatinine Ratio: 15 (ref 9–23)
BUN: 12 mg/dL (ref 6–20)
Bilirubin Total: <0.2 mg/dL (ref 0.0–1.2)
CO2: 23 mmol/L (ref 20–29)
Calcium: 9.3 mg/dL (ref 8.7–10.2)
Chloride: 101 mmol/L (ref 96–106)
Creatinine, Ser: 0.81 mg/dL (ref 0.57–1.00)
Globulin, Total: 2.8 g/dL (ref 1.5–4.5)
Glucose: 89 mg/dL (ref 70–99)
Potassium: 4.5 mmol/L (ref 3.5–5.2)
Sodium: 138 mmol/L (ref 134–144)
Total Protein: 7.3 g/dL (ref 6.0–8.5)
eGFR: 99 mL/min/{1.73_m2} (ref >59)

## 2022-08-06 LAB — MICROSCOPIC EXAMINATION
Casts: NONE SEEN /lpf
Epithelial Cells (non renal): >10 /hpf — AB (ref 0–10)
RBC, Urine: NONE SEEN /hpf (ref 0–2)

## 2022-08-06 LAB — THYROID PANEL WITH TSH
Free Thyroxine Index: 1.4 (ref 1.2–4.9)
T3 Uptake Ratio: 28 % (ref 24–39)
T4, Total: 4.9 ug/dL (ref 4.5–12.0)
TSH: 1.54 u[IU]/mL (ref 0.450–4.500)

## 2022-08-06 LAB — HCV AB W REFLEX TO QUANT PCR: HCV Ab: NONREACTIVE

## 2022-08-06 LAB — HCV INTERPRETATION

## 2022-08-07 ENCOUNTER — Other Ambulatory Visit: Payer: Self-pay

## 2022-08-11 ENCOUNTER — Ambulatory Visit (INDEPENDENT_AMBULATORY_CARE_PROVIDER_SITE_OTHER): Payer: Self-pay | Admitting: Sports Medicine

## 2022-08-11 ENCOUNTER — Other Ambulatory Visit: Payer: Self-pay

## 2022-08-11 ENCOUNTER — Encounter: Payer: Self-pay | Admitting: Sports Medicine

## 2022-08-11 DIAGNOSIS — M65331 Trigger finger, right middle finger: Secondary | ICD-10-CM

## 2022-08-11 MED ORDER — LIDOCAINE HCL 1 % IJ SOLN
1.0000 mL | INTRAMUSCULAR | Status: AC | PRN
  Administered 2022-08-11: 1 mL

## 2022-08-11 MED ORDER — BETAMETHASONE SOD PHOS & ACET 6 (3-3) MG/ML IJ SUSP
6.0000 mg | INTRAMUSCULAR | Status: AC | PRN
  Administered 2022-08-11: 6 mg via INTRA_ARTICULAR

## 2022-08-11 NOTE — Progress Notes (Signed)
Paula Wiggins - 32 y.o. female MRN [id number]  Date of birth: [birth year]  Office Visit Note: Visit Date: 08/11/2022 PCP: Gildardo Pounds, NP Referred by: Gildardo Pounds, NP  Subjective: Chief Complaint  Patient presents with   Right Middle Finger - Pain   HPI: Paula Wiggins is a pleasant 32 y.o. female who presents today for right middle finger pain with triggering.    She has had painful triggering episodes for almost 1 year now.  Denies any specific injury or inciting event.  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  She is right-hand dominant.  She did buy a custom splint off [employer] to help keep the finger straight which does help control her pain to some extent.  It is very painful when he gets stuck, this happens daily she will need to passively open the finger.  Pertinent ROS were reviewed with the patient and found to be negative unless otherwise specified above in HPI.   Assessment & Plan: Visit Diagnoses:  1. Trigger finger, right middle finger    Plan: Discussed with Cindra Eves' that she has a rather significant trigger finger of the right long finger.  We discussed all treatment options, through shared decision making elected to proceed with trigger finger injection into the A1 pulley.  She tolerated well with mild pain.  She will continue in her trigger finger splint 24 hours for the next 3 days.  She will take ibuprofen and ice as well for the next 3 days.  After Friday, she may return to using the finger splint only during the day with activity.  I would like to see her back in 1 month for reevaluation. May use OTC-antiinflammatories for any post-injection pain.  We did discuss trigger finger with A1 pulley release surgery, she will hold on this for now.  Follow-up: Return in about 1 month (around 09/11/2022) for for trigger finger.   Meds & Orders: No orders of the defined types were placed in this encounter.    Orders Placed This Encounter  Procedures   Hand/UE Inj     Procedures: Hand/UE Inj: R long A1 for trigger finger on 08/11/2022 3:28 PM Indications: pain and tendon swelling Details: 25 G needle, volar approach Medications: 1 mL lidocaine 1 %; 6 mg betamethasone acetate-betamethasone sodium phosphate 6 (3-3) MG/ML Outcome: tolerated well, no immediate complications Procedure, treatment alternatives, risks and benefits explained, specific risks discussed. Consent was given by the patient. Immediately prior to procedure a time out was called to verify the correct patient, procedure, equipment, support staff and site/side marked as required. Patient was prepped and draped in the usual sterile fashion.         Clinical History: No specialty comments available.  She reports that she quit smoking about 14 months ago. Her smoking use included cigarettes. She has never used smokeless tobacco. No results for input(s): "HGBA1C", "LABURIC" in the last 8760 hours.  Objective:   Vital Signs: LMP 07/22/2022 (Exact Date)   Physical Exam  Gen: Well-appearing, in no acute distress; non-toxic CV: Regular Rate. Well-perfused. Warm.  Resp: Breathing unlabored on room air; no wheezing. Psych: Fluid speech in conversation; appropriate affect; normal thought process Neuro: Sensation intact throughout. No gross coordination deficits.   Ortho Exam - Right long finger: + TTP over A1 pulley of right long finger.  No overlying redness or swelling.  There is active triggering on exam with close  fish and opening, needed to be open passively during each occurrence. NVI.  Imaging: No results found.  Past Medical/Family/Surgical/Social History: Medications & Allergies reviewed per EMR, new medications updated. Patient Active Problem List   Diagnosis Date Noted   SOB (shortness of breath) 11/26/2018   GERD (gastroesophageal reflux disease) 03/10/2017   Mold exposure 03/10/2017   Asthma, severe persistent  02/13/2017   Past Medical History:  Diagnosis Date   Asthma    Family History  Problem Relation Age of Onset   Hypertension Mother    Asthma Mother    Seizures Father    Hypertension Father    Congestive Heart Failure Paternal Grandfather    Asthma Sister    Emphysema Maternal Grandmother    Asthma Sister    Multiple sclerosis Paternal Aunt    No past surgical history on file. Social History   Occupational History   Not on file  Tobacco Use   Smoking status: Former    Years: 5    Types: Cigarettes    Quit date: 2023    Years since quitting: 1.2   Smokeless tobacco: Never   Tobacco comments:    Smoked for 1 month total.   Vaping Use   Vaping Use: Never used  Substance and Sexual Activity   Alcohol use: Not Currently   Drug use: No   Sexual activity: Yes    Comment: "with a woman"

## 2022-08-11 NOTE — Progress Notes (Signed)
Right middle finger trigger Few months Bought splint off [employer] to wear to help keep finger straight- does help  Very painful when it gets stuck;

## 2022-08-17 ENCOUNTER — Other Ambulatory Visit: Payer: Self-pay

## 2022-08-19 ENCOUNTER — Ambulatory Visit: Payer: Self-pay | Admitting: Orthopedic Surgery

## 2022-09-11 ENCOUNTER — Ambulatory Visit: Payer: Self-pay | Admitting: Sports Medicine

## 2022-10-08 ENCOUNTER — Ambulatory Visit: Payer: Self-pay | Admitting: Physician Assistant

## 2022-11-26 ENCOUNTER — Ambulatory Visit: Payer: Self-pay | Admitting: Physician Assistant

## 2022-12-08 ENCOUNTER — Ambulatory Visit (INDEPENDENT_AMBULATORY_CARE_PROVIDER_SITE_OTHER): Payer: No Typology Code available for payment source | Admitting: Sports Medicine

## 2022-12-08 ENCOUNTER — Other Ambulatory Visit: Payer: Self-pay

## 2022-12-08 ENCOUNTER — Encounter: Payer: Self-pay | Admitting: Sports Medicine

## 2022-12-08 DIAGNOSIS — M65331 Trigger finger, right middle finger: Secondary | ICD-10-CM | POA: Diagnosis not present

## 2022-12-08 MED ORDER — MELOXICAM 15 MG PO TABS
15.0000 mg | ORAL_TABLET | Freq: Every day | ORAL | 0 refills | Status: AC
  Filled 2022-12-08: qty 30, 30d supply, fill #0

## 2022-12-08 MED ORDER — BETAMETHASONE SOD PHOS & ACET 6 (3-3) MG/ML IJ SUSP
6.0000 mg | INTRAMUSCULAR | Status: AC | PRN
  Administered 2022-12-08: 6 mg via INTRA_ARTICULAR

## 2022-12-08 MED ORDER — LIDOCAINE HCL 1 % IJ SOLN
1.0000 mL | INTRAMUSCULAR | Status: AC | PRN
  Administered 2022-12-08: 1 mL

## 2022-12-08 NOTE — Progress Notes (Signed)
Paula Wiggins - 32 y.o. female MRN [id number]  Date of birth: [birth year]  Office Visit Note: Visit Date: 12/08/2022 PCP: Claiborne Rigg, NP Referred by: Claiborne Rigg, NP  Subjective: Chief Complaint  Patient presents with   Right Hand - Pain   HPI: Paula Wiggins "Paula Wiggins" is a pleasant 32 y.o. female who presents today for follow-up of right finger middle trigger finger.  Last saw her on 08/11/2022 for right middle trigger finger.  We did perform trigger finger injection which relieved her pain and triggering episodes for about 2-3 weeks, but then after 3 weeks after the injection, her pain then return.  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  She has tried over-the-counter splints from [employer] but this is not comfortable for her.  Takes ibuprofen only as needed.  She is not interested in surgical release at this time, would like to exhaust her conservative options first.  Pertinent ROS were reviewed with the patient and found to be negative unless otherwise specified above in HPI.   Assessment & Plan: Visit Diagnoses:  1. Trigger finger, right middle finger    Plan: "Paula Wiggins" has had an exacerbation of her chronic right long finger trigger finger.  Back in March she gave have a trigger finger injection which gave her resolution of her triggering for about 3 weeks, however her pain returned.  She was supposed to follow-up in 1 month, but unfortunately did not.  We discussed treatment options such as a trial of repeat trigger finger injection and custom splinting, referral to our hand surgeon for surgical trigger finger release, she would like to avoid surgery at all cost if possible.  We did proceed with trigger finger injection into the A1 pulley today, patient tolerated well.  Placed referral for occupational therapist, Earnest Rosier, so he can hopefully make a custom splint for the trigger finger given that she  needs to work as a Runner, broadcasting/film/video.  Can also treat and perform soft tissue modalities at his discretion.  We will attempt to help settle down the inflammation and we will start her on meloxicam 15 mg to be taken once daily.  I would like to see her back in 1 month, if she has 100% resolution of her triggering we will continue above treatments, if not we could consider 1 additional trigger finger injection for cumulative benefit.  Follow-up in 1 month.  Follow-up: Return in about 1 month (around 01/08/2023) for for trigger finger (30-mins for likely inj).   Meds & Orders:  Meds ordered this encounter  Medications   meloxicam (MOBIC) 15 MG tablet    Sig: Take 1 tablet (15 mg total) by mouth daily.    Dispense:  30 tablet    Refill:  0    Orders Placed This Encounter  Procedures   Hand/UE Inj: R long A1   Ambulatory referral to Occupational Therapy     Procedures: Hand/UE Inj: R long A1 for trigger finger on 12/08/2022 2:41 PM Indications: pain and tendon swelling Details: 25 G needle, volar approach Medications: 1 mL lidocaine 1 %; 6 mg betamethasone acetate-betamethasone sodium phosphate 6 (3-3) MG/ML Outcome: tolerated well, no immediate complications Procedure, treatment alternatives, risks and benefits explained, specific risks discussed. Consent was given by the patient. Immediately prior to procedure a time out was called to verify the correct patient, procedure, equipment, support staff and site/side marked as required. Patient  was prepped and draped in the usual sterile fashion.          Clinical History: No specialty comments available.  She reports that she quit smoking about 18 months ago. Her smoking use included cigarettes. She started smoking about 6 years ago. She has never used smokeless tobacco. No results for input(s): "HGBA1C", "LABURIC" in the last 8760 hours.  Objective:   Vital Signs: There were no vitals taken for this visit.  Physical Exam  Gen:  Well-appearing, in no acute distress; non-toxic CV:  Well-perfused. Warm.  Resp: Breathing unlabored on room air; no wheezing. Psych: Fluid speech in conversation; appropriate affect; normal thought process Neuro: Sensation intact throughout. No gross coordination deficits.   Ortho Exam - Right hand/long finger: Mild TTP over the A1 pulley on the plantar aspect of the hand.  There is reproducible triggering with active closed fist and open fist.  No dactylitis.  Cap refill less than 2 seconds.  Imaging: No results found.  Past Medical/Family/Surgical/Social History: Medications & Allergies reviewed per EMR, new medications updated. Patient Active Problem List   Diagnosis Date Noted   SOB (shortness of breath) 11/26/2018   GERD (gastroesophageal reflux disease) 03/10/2017   Mold exposure 03/10/2017   Asthma, severe persistent 02/13/2017   Past Medical History:  Diagnosis Date   Asthma    Family History  Problem Relation Age of Onset   Hypertension Mother    Asthma Mother    Seizures Father    Hypertension Father    Congestive Heart Failure Paternal Grandfather    Asthma Sister    Emphysema Maternal Grandmother    Asthma Sister    Multiple sclerosis Paternal Aunt    No past surgical history on file. Social History   Occupational History   Not on file  Tobacco Use   Smoking status: Former    Current packs/day: 0.00    Types: Cigarettes    Start date: 2018    Quit date: 2023    Years since quitting: 1.5   Smokeless tobacco: Never   Tobacco comments:    Smoked for 1 month total.   Vaping Use   Vaping status: Never Used  Substance and Sexual Activity   Alcohol use: Not Currently   Drug use: No   Sexual activity: Yes    Comment: "with a woman"

## 2022-12-08 NOTE — Progress Notes (Signed)
Right middle trigger Feels like it is worse Does not feel like injection helped Ibuprofen for pain- no relief

## 2022-12-15 NOTE — Therapy (Signed)
OUTPATIENT OCCUPATIONAL THERAPY ORTHO EVALUATION  Patient Name: Paula Wiggins MRN: [id number] DOB:[birth year], 32 y.o., female Today's Date: 12/17/2022  PCP: Bertram Denver, NP REFERRING PROVIDER: Madelyn Brunner, DO   END OF SESSION:  OT End of Session - 12/17/22 1025     Visit Number 1    Number of Visits 8    Date for OT Re-Evaluation 01/29/23    Authorization Type "[employer]"    OT Start Time 1026    OT Stop Time 1106    OT Time Calculation (min) 40 min    Equipment Utilized During Treatment orthotic materials    Activity Tolerance Patient tolerated treatment well;Patient limited by fatigue;Patient limited by pain    Behavior During Therapy [facility] for tasks assessed/performed;Anxious   Paula Wiggins did bring therpay dog due to typical anxiety            Past Medical History:  Diagnosis Date   Asthma    History reviewed. No pertinent surgical history. Patient Active Problem List   Diagnosis Date Noted   SOB (shortness of breath) 11/26/2018   GERD (gastroesophageal reflux disease) 03/10/2017   Mold exposure 03/10/2017   Asthma, severe persistent 02/13/2017    ONSET DATE: approx 1 year onset finger/wrist pain  REFERRING DIAG: M65.331 (ICD-10-CM) - Trigger finger, right middle finger   THERAPY DIAG:  Other lack of coordination  Muscle weakness (generalized)  Pain in right hand  Stiffness of right hand, not elsewhere classified  Stiffness of right wrist, not elsewhere classified  Pain in right wrist  Rationale for Evaluation and Treatment: Rehabilitation  SUBJECTIVE:   SUBJECTIVE STATEMENT: Paula Wiggins arrives with therapy dog today named "General."   Paula Wiggins goes by "[name]."   Paula Wiggins states her two issues are Rt middle finger pain and Rt dorsal wrist pain.  Paula Wiggins states Paula Wiggins did well for 2-3 weeks after 2nd injection (previously injections worked well), then her finger started hurting again.  Paula Wiggins "had to pull it open" and it made her hand swell up.  Paula Wiggins has not tried to rest  much, though Paula Wiggins did buy a prefabricated finger sleeve from [employer], though states it does not help her pains much.  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  Paula Wiggins is able to go on light duty which OT advises her to do, and Paula Wiggins seems to be dealing with multiple repetitive stress injuries.  Paula Wiggins can barely tolerate touching her finger today, flinching and moaning in pain, Paula Wiggins states inability to move lift and carry objects due to decreased grasp and pain in wrist and hand.   PERTINENT HISTORY: Per MD notes: "Last saw her on 08/11/2022 for right middle trigger finger.  We did perform trigger finger injection which relieved her pain and triggering episodes for about 2-3 weeks, but then after 3 weeks after the injection, her pain then return.  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  Paula Wiggins has tried over-the-counter splints from [employer] but this is not comfortable for her.    PRECAUTIONS: None  RED FLAGS: None   WEIGHT BEARING RESTRICTIONS: OT recommends no painful lifting/gripping for 4-6 weeks at least   PAIN:  Are you having pain? Yes: NPRS scale: "1,000"/10 Pain location: Rt dorsum of wrist and Rt volar MF MCP J up to DIP Pain description: Stabbing and sharp Aggravating factors: Gripping squeezing pushing Relieving factors: Rest and immobilization  FALLS:  Has patient fallen in last 6 months? No  PLOF: Independent  PATIENT GOALS: To decrease the pain in her hand and increase her ability   OBJECTIVE: (All objective assessments below are from initial evaluation on: 12/17/22 unless otherwise specified.)   HAND DOMINANCE: Right   ADLs: Overall ADLs: States decreased ability to grab, hold household objects, pain and inability to open containers, perform FMS tasks (manipulate fasteners on clothing), mild to moderate bathing problems as well.    FUNCTIONAL OUTCOME MEASURES: Eval: Due to length of  evaluation the specific test was not able to be completed at this time-details will be completed in the next visit.  Quick DASH TBD% impairment today  (Higher % Score  =  More Impairment)     UPPER EXTREMITY ROM     Shoulder to Wrist AROM Right eval  Shoulder flexion   Shoulder abduction   Shoulder extension   Shoulder internal rotation   Shoulder external rotation   Elbow flexion WNL  Elbow extension WNL  Forearm supination 30  Forearm pronation  25  Wrist flexion 34  Wrist extension 14  Wrist ulnar deviation   Wrist radial deviation   Functional dart thrower's motion (F-DTM) in ulnar flexion   F-DTM in radial extension    (Blank rows = not tested)   Hand AROM Right eval  Full Fist Ability (or Gap to Distal Palmar Crease) MF is approx 5-6 cm from making full fist, other fingers can touch palm  Thumb Opposition  (Kapandji Scale)  WFL  Long MCP (0-90) (-5) - 22  Long PIP (0-100) (-11) - 57  Long DIP (0-70) (-5) - 24  (Blank rows = not tested)   UPPER EXTREMITY MMT:    Eval:  NT at eval due to painful, still healing injuries. Grossly weak and 3-/5 in wrist and hand in Rt dom arm- will be further tested when appropriate.   MMT Right TBD  Shoulder flexion   Shoulder abduction   Shoulder adduction   Shoulder extension   Shoulder internal rotation   Shoulder external rotation   Middle trapezius   Lower trapezius   Elbow flexion   Elbow extension   Forearm supination   Forearm pronation   Wrist flexion   Wrist extension   Wrist ulnar deviation   Wrist radial deviation   (Blank rows = not tested)  HAND FUNCTION: Eval: Observed weakness in affected hand.  Grip strength Right: TBD lbs, Left: TBD lbs   COORDINATION: Eval: Observed coordination impairments with affected Rt hand, details TBD.  9 Hole Peg Test Right: TBDsec,   SENSATION: Eval:  Light touch intact today, though very guarded and hypersensitive around the volar MCP joint of the right middle  finger.  EDEMA:   Eval: None specifically today other than some mild swelling over the dorsum of the extensor retinaculum area and tender and puffy through the middle finger of the right hand.  COGNITION: Eval: Overall cognitive status: WFL for evaluation today, Paula Wiggins was pleasant and nice, but needed to speak to and pet her therapy dog quite frequently.  OBSERVATIONS:   Eval: Paula Wiggins has a swollen lump on the back of her right wrist just distal to the extensor retinaculum that is tender, Paula Wiggins is also tender to the middle finger A1 pulley and atypically tender through the whole digit up to the DIP joint.  The joints themselves were not overly tender but rather the soft tissue structures that lay above them.  Paula Wiggins may be having triggering at  multiple annular ligaments/pulleys.  Difficult to determine due to her guarding and tenderness and chronicity of her symptoms.  Paula Wiggins presents like extensor AND flexor (trigger finger) tenosynovitis to the Rt hand/wrist MF today.    TODAY'S TREATMENT:  Post-evaluation treatment:  Due to extreme tenderness, pain, guarding OT decides to immobilize her whole finger from maximal rest during the day.  OT makes a custom finger based orthosis for the right hand middle finger that puts it on a comfortable bend at all joints that should be more functional with her daily routines.  Paula Wiggins states that it does not hurt or apply much pressure on her finger and that it actually prevents pain when Paula Wiggins is trying to open and close her hand.  It does restrict her motion in flexion and extension.  Paula Wiggins was educated to take it off 4-6 times a day carefully to perform slow and nonpainful exercises as listed below.  Paula Wiggins states understanding.  At the end of this treatment portion, Paula Wiggins mentions that her wrist also hurts-and this is when OT also finds out Paula Wiggins has an extensor tenosynovitis which would likely require different kinds of bracing.  Especially at night, as Paula Wiggins is now reporting that Paula Wiggins  cannot sleep at all and is very painful at night.  Exercises - Tendon Glides  - 4-6 x daily - 3-5 reps - 2-3 seconds hold - Finger Spreading  - 4-6 x daily - 10-15 reps   PATIENT EDUCATION: Education details: See tx section above for details  Person educated: Patient Education method: Verbal Instruction, Teach back, Handouts  Education comprehension: States and demonstrates understanding, Additional Education required    HOME EXERCISE PROGRAM: Access Code: [email or web address] URL: [email or web address] Date: 12/17/2022 Prepared by: Fannie Knee   GOALS: Goals reviewed with patient? Yes   SHORT TERM GOALS: (STG required if POC>30 days) Target Date: 01/01/23  Pt will obtain protective, custom orthotic. Goal status: partially met, will need wrist support for extensor issue as well.   2.  Pt will demo/state understanding of initial HEP to improve pain levels and prerequisite motion. Goal status: INITIAL   LONG TERM GOALS: Target Date: 01/29/23  Pt will improve functional ability by decreased impairment per Quick DASH assessment from TBD to TBD or better, for better quality of life. Goal status: INITIAL  2.  Pt will improve grip strength in Rt hand to at least 40lbs for functional use at home and in IADLs. Goal status: INITIAL  3.  Pt will improve A/ROM in Rt MF TAM from 82* to at least 200*, to have functional motion for tasks like reach and grasp.  Goal status: INITIAL  4.  Pt will improve strength in Rt wrist flex/ext from  painful 3-/5 MMT to at least 4+/5 MMT to have increased functional ability to carry out selfcare and higher-level homecare tasks with no difficulty. Goal status: INITIAL  5.  Pt will improve coordination skills in Rt hand/arm, as seen by better score on 9HPT testing to [facility], to have increased functional ability to carry out fine motor tasks (fasteners, etc.) and more complex, coordinated IADLs (meal prep, sports, etc.).  Goal status: INITIAL-  TBD baseline  6.  Pt will decrease pain at worst from "1000"/10 to 5/10 or better to have better sleep and occupational participation in daily roles. Goal status: INITIAL   ASSESSMENT:  CLINICAL IMPRESSION: Patient is a 32 y.o. female who was seen today for occupational therapy evaluation for pain right wrist, hand, middle finger and decreased ability to  do her job and daily tasks.  Paula Wiggins will benefit from immobilization in orthoses and OT therapeutic treatments to decrease her pain and increase her ability.  PERFORMANCE DEFICITS: in functional skills including ADLs, IADLs, coordination, dexterity, sensation, edema, ROM, strength, pain, fascial restrictions, muscle spasms, flexibility, Fine motor control, Gross motor control, body mechanics, endurance, decreased knowledge of precautions, and UE functional use, cognitive skills including problem solving, safety awareness, and understand, and psychosocial skills including coping strategies, environmental adaptation, habits, and routines and behaviors.   IMPAIRMENTS: are limiting patient from ADLs, IADLs, rest and sleep, work, and leisure.   COMORBIDITIES: may have co-morbidities  that affects occupational performance. Patient will benefit from skilled OT to address above impairments and improve overall function.  MODIFICATION OR ASSISTANCE TO COMPLETE EVALUATION: Min-Moderate modification of tasks or assist with assess necessary to complete an evaluation.  OT OCCUPATIONAL PROFILE AND HISTORY: Problem focused assessment: Including review of records relating to presenting problem.  CLINICAL DECISION MAKING: Moderate - several treatment options, min-mod task modification necessary  REHAB POTENTIAL: Good  EVALUATION COMPLEXITY: Low      PLAN:  OT FREQUENCY: 2x week until orthoses are complete for rest, then 1x week follow-ups seems appropriate for now  OT DURATION: 6 weeks  PLANNED INTERVENTIONS: self care/ADL training, therapeutic  exercise, therapeutic activity, neuromuscular re-education, manual therapy, passive range of motion, splinting, ultrasound, paraffin, fluidotherapy, compression bandaging, moist heat, cryotherapy, contrast bath, patient/family education, energy conservation, coping strategies training, DME and/or AE instructions, Re-evaluation, and Dry needling  RECOMMENDED OTHER SERVICES: None now  CONSULTED AND AGREED WITH PLAN OF CARE: Patient  PLAN FOR NEXT SESSION:   Check finger-based orthosis and consider making nighttime resting wrist and hand immobilization orthosis to also help with the extensor tenosynovitis at the hand and wrist.  Review exercises, try nine-hole peg test if able.   Fannie Knee, OTR/L 12/17/2022, 6:38 PM

## 2022-12-16 ENCOUNTER — Other Ambulatory Visit: Payer: Self-pay

## 2022-12-17 ENCOUNTER — Encounter: Payer: Self-pay | Admitting: Rehabilitative and Restorative Service Providers"

## 2022-12-17 ENCOUNTER — Other Ambulatory Visit: Payer: Self-pay

## 2022-12-17 ENCOUNTER — Ambulatory Visit: Payer: Self-pay | Admitting: Rehabilitative and Restorative Service Providers"

## 2022-12-17 DIAGNOSIS — M25641 Stiffness of right hand, not elsewhere classified: Secondary | ICD-10-CM | POA: Diagnosis not present

## 2022-12-17 DIAGNOSIS — R278 Other lack of coordination: Secondary | ICD-10-CM | POA: Diagnosis not present

## 2022-12-17 DIAGNOSIS — M25631 Stiffness of right wrist, not elsewhere classified: Secondary | ICD-10-CM

## 2022-12-17 DIAGNOSIS — M6281 Muscle weakness (generalized): Secondary | ICD-10-CM | POA: Diagnosis not present

## 2022-12-17 DIAGNOSIS — M25531 Pain in right wrist: Secondary | ICD-10-CM

## 2022-12-17 DIAGNOSIS — M79641 Pain in right hand: Secondary | ICD-10-CM | POA: Diagnosis not present

## 2022-12-25 ENCOUNTER — Encounter: Payer: No Typology Code available for payment source | Admitting: Rehabilitative and Restorative Service Providers"

## 2022-12-25 ENCOUNTER — Telehealth: Payer: Self-pay | Admitting: Rehabilitative and Restorative Service Providers"

## 2022-12-25 NOTE — Telephone Encounter (Signed)
OT called patient to discuss missed appointment. OT left message with pt about next appointment date/time. They were reminded of the attendance policy and future missed appointments could lead to early discharge from therapy.

## 2022-12-29 ENCOUNTER — Encounter: Payer: No Typology Code available for payment source | Admitting: Rehabilitative and Restorative Service Providers"

## 2023-01-05 ENCOUNTER — Encounter: Payer: No Typology Code available for payment source | Admitting: Occupational Therapy

## 2023-01-08 ENCOUNTER — Ambulatory Visit: Payer: No Typology Code available for payment source | Admitting: Sports Medicine

## 2023-01-12 ENCOUNTER — Telehealth: Payer: Self-pay | Admitting: Occupational Therapy

## 2023-01-12 ENCOUNTER — Encounter: Payer: No Typology Code available for payment source | Admitting: Occupational Therapy

## 2023-01-12 NOTE — Telephone Encounter (Signed)
Called pt, no answer and left message stating that she's no showed for last 3 appointments in a row. Notified her that remaining appointments will be canceled at this time and should she require continued out-pt therapy, she will need new orders from MD. Will d/c pt at this time.

## 2023-01-19 ENCOUNTER — Encounter: Payer: No Typology Code available for payment source | Admitting: Occupational Therapy

## 2023-01-25 ENCOUNTER — Encounter: Payer: No Typology Code available for payment source | Admitting: Rehabilitative and Restorative Service Providers"

## 2023-01-26 ENCOUNTER — Encounter: Payer: No Typology Code available for payment source | Admitting: Rehabilitative and Restorative Service Providers"

## 2023-01-27 ENCOUNTER — Encounter: Payer: No Typology Code available for payment source | Admitting: Rehabilitative and Restorative Service Providers"

## 2023-02-02 ENCOUNTER — Encounter: Payer: No Typology Code available for payment source | Admitting: Rehabilitative and Restorative Service Providers"

## 2023-02-16 ENCOUNTER — Ambulatory Visit: Payer: No Typology Code available for payment source | Admitting: Nurse Practitioner

## 2023-03-11 ENCOUNTER — Ambulatory Visit: Payer: No Typology Code available for payment source | Admitting: Family Medicine

## 2023-03-18 ENCOUNTER — Emergency Department (HOSPITAL_COMMUNITY)
Admission: EM | Admit: 2023-03-18 | Discharge: 2023-03-19 | Disposition: A | Discharge status: Home / Self Care | Payer: PRIVATE HEALTH INSURANCE | Attending: Emergency Medicine | Admitting: Emergency Medicine

## 2023-03-18 ENCOUNTER — Other Ambulatory Visit: Payer: Self-pay

## 2023-03-18 ENCOUNTER — Encounter (HOSPITAL_COMMUNITY): Payer: Self-pay

## 2023-03-18 DIAGNOSIS — M5412 Radiculopathy, cervical region: Secondary | ICD-10-CM | POA: Insufficient documentation

## 2023-03-18 DIAGNOSIS — J45909 Unspecified asthma, uncomplicated: Secondary | ICD-10-CM | POA: Diagnosis not present

## 2023-03-18 DIAGNOSIS — Z7951 Long term (current) use of inhaled steroids: Secondary | ICD-10-CM | POA: Diagnosis not present

## 2023-03-18 DIAGNOSIS — M79601 Pain in right arm: Secondary | ICD-10-CM | POA: Diagnosis present

## 2023-03-18 NOTE — ED Triage Notes (Signed)
Pt POV from home d/t pain in right arm pain for 2 weeks.  "Helps  to elevate it and pain starts in [facility] area and radiates".

## 2023-03-19 MED ORDER — KETOROLAC TROMETHAMINE 60 MG/2ML IM SOLN
60.0000 mg | Freq: Once | INTRAMUSCULAR | Status: AC
  Administered 2023-03-19: 60 mg via INTRAMUSCULAR
  Filled 2023-03-19: qty 2

## 2023-03-19 NOTE — ED Provider Notes (Signed)
[place] EMERGENCY DEPARTMENT AT [facility] Provider Note   CSN: [id number] Arrival date & time: 03/18/23  2126     History  Chief Complaint  Patient presents with   Arm Pain    Paula Wiggins is a 32 y.o. female.  The history is provided by the patient.  Arm Pain  Paula Wiggins is a 32 y.o. female who presents to the Emergency Department complaining of arm pain.  She presents to the emergency department for evaluation of 2 weeks of pain in her right arm.  No reported injuries.  Pain is predominantly located in her right AC fossa but is throughout her entire right upper extremity.  It goes from her fingertips to her right lateral neck.  Pain is worse with flexion of the elbow.  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  No associated fevers, abdominal pain, nausea, vomiting.  She does feel slightly weak in the right arm compared to the left.  She has a history of asthma, no additional medical problems.  No OCP.  No history of DVT/PE.      Home Medications Prior to Admission medications   Medication Sig Start Date End Date Taking? Authorizing Provider  albuterol (PROVENTIL) (2.5 MG/3ML) 0.083% nebulizer solution Take 3 mLs by nebulization every 6 (six) hours as needed. 08/05/22 08/05/23  Claiborne Rigg, NP  albuterol (VENTOLIN HFA) 108 (90 Base) MCG/ACT inhaler Inhale 2 puffs into the lungs once every 6 (six) hours as needed. 08/05/22   Claiborne Rigg, NP  amitriptyline (ELAVIL) 10 MG tablet Take 1 tablet (10 mg total) by mouth at bedtime. For headaches and depression 08/05/22   Claiborne Rigg, NP  benzonatate (TESSALON) 100 MG capsule Take 1 capsule (100 mg total) by mouth every 8 (eight) hours. 05/24/22   Fayrene Helper, PA-C  cetirizine (ZYRTEC) 10 MG tablet Take 1 tablet (10 mg total) by mouth daily. 08/05/22   Claiborne Rigg, NP  EPINEPHrine 0.3 mg/0.3 mL IJ SOAJ injection Inject 0.3 mg into the muscle as needed for anaphylaxis. 08/05/22   Claiborne Rigg, NP  fluticasone-salmeterol [facility] INHUB) 500-50 MCG/ACT AEPB Inhale 1 puff into the lungs in the morning and at bedtime. 08/05/22   Hoy Register, MD  montelukast (SINGULAIR) 10 MG tablet Take 1 tablet (10 mg total) by mouth at bedtime. 08/05/22   Claiborne Rigg, NP  omeprazole (PRILOSEC) 20 MG capsule Take 1 capsule (20 mg total) by mouth daily. 08/05/22   Claiborne Rigg, NP  senna-docusate (SENOKOT-S) 8.6-50 MG tablet Take 2 tablets by mouth daily. 08/05/22   Claiborne Rigg, NP      Allergies    Oxycodone, Shrimp flavor, and Watermelon flavor    Review of Systems   Review of Systems  All other systems reviewed and are negative.   Physical Exam Updated Vital Signs BP 123/88   Pulse 95   Temp 98.5 F (36.9 C)   Resp 17   Ht 5\' 3"  (1.6 m)   Wt 106.6 kg   LMP 02/20/2023   SpO2 100%   BMI 41.63 kg/m  Physical Exam Vitals and nursing note reviewed.  Constitutional:      Appearance: She is well-developed.  HENT:     Head: Normocephalic and atraumatic.  Cardiovascular:     Rate and Rhythm: Normal rate and regular rhythm.     Heart sounds: No murmur heard. Pulmonary:     Effort: Pulmonary effort is normal. No  respiratory distress.     Breath sounds: Normal breath sounds.  Abdominal:     Palpations: Abdomen is soft.     Tenderness: There is no abdominal tenderness. There is no guarding or rebound.  Musculoskeletal:        General: No tenderness.     Comments: 2+ right radial pulse.  There is mild tenderness to palpation throughout the right upper extremity without any overlying skin lesions.  She is able to range the right shoulder, elbow, wrist.  Skin:    General: Skin is warm and dry.  Neurological:     Mental Status: She is alert and oriented to person, place, and time.     Comments: There is slight decreased grip strength in the right hand and proximal right upper extremity strength, altered sensation to light touch in the right upper extremity proximally.  5 out  of 5 strength in the left upper extremity, bilateral lower extremities with sensation to light touch intact in the left upper extremity, bilateral lower extremities.  Psychiatric:        Behavior: Behavior normal.     ED Results / Procedures / Treatments   Labs (all labs ordered are listed, but only abnormal results are displayed) Labs Reviewed - No data to display  EKG None  Radiology No results found.  Procedures Procedures    Medications Ordered in ED Medications  ketorolac (TORADOL) injection 60 mg (60 mg Intramuscular Given 03/19/23 0042)    ED Course/ Medical Decision Making/ A&P                                 Medical Decision Making Risk Prescription drug management.   Patient here for evaluation of right upper extremity pain, weakness and numbness.  She does have some weakness on examination although pain limits strength testing in the right arm.  On examination she has a radiculopathy, no red flags for epidural abscess.  Presentation is not consistent with CVA, DVT, acute infectious process.  Discussed with patient recommendation for imaging to further evaluate her symptoms, she declines at this time.  Will treat with anti-inflammatories, rest.  Discussed outpatient follow-up.        Final Clinical Impression(s) / ED Diagnoses Final diagnoses:  Cervical radiculopathy    Rx / DC Orders ED Discharge Orders     None         Tilden Fossa, MD 03/19/23 [id number]

## 2023-04-12 ENCOUNTER — Ambulatory Visit: Payer: No Typology Code available for payment source | Admitting: Physician Assistant

## 2023-04-12 NOTE — Progress Notes (Deleted)
Patient ID: [name], female   DOB: 05-05-1991, 32 y.o.   MRN: [id number]   After ED visit 03/18/2023 for R arm pain  Paula Wiggins is a 32 y.o. female who presents to the Emergency Department complaining of arm pain.  She presents to the emergency department for evaluation of 2 weeks of pain in her right arm.  No reported injuries.  Pain is predominantly located in her right AC fossa but is throughout her entire right upper extremity.  It goes from her fingertips to her right lateral neck.  Pain is worse with flexion of the elbow.  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  No associated fevers, abdominal pain, nausea, vomiting.  She does feel slightly weak in the right arm compared to the left.  She has a history of asthma, no additional medical problems.  No OCP.  No history of DVT/PE.  Patient here for evaluation of right upper extremity pain, weakness and numbness. She does have some weakness on examination although pain limits strength testing in the right arm. On examination she has a radiculopathy, no red flags for epidural abscess. Presentation is not consistent with CVA, DVT, acute infectious process. Discussed with patient recommendation for imaging to further evaluate her symptoms, she declines at this time. Will treat with anti-inflammatories, rest.

## 2023-04-28 ENCOUNTER — Ambulatory Visit: Payer: No Typology Code available for payment source | Admitting: Physician Assistant

## 2023-05-25 ENCOUNTER — Ambulatory Visit: Payer: No Typology Code available for payment source | Admitting: Sports Medicine

## 2023-05-25 ENCOUNTER — Encounter: Payer: Self-pay | Admitting: Sports Medicine

## 2023-05-25 DIAGNOSIS — M65331 Trigger finger, right middle finger: Secondary | ICD-10-CM

## 2023-05-25 NOTE — Progress Notes (Signed)
 Paula Wiggins - 33 y.o. female MRN [id number]  Date of birth: 12-Jan-1991  Office Visit Note: Visit Date: 05/25/2023 PCP: Theotis Haze ORN, NP Referred by: Theotis Haze ORN, NP  Subjective: Chief Complaint  Patient presents with   Right Hand - Pain   HPI: Paula Wiggins is a pleasant 33 y.o. female who presents today for follow-up of right middle finger trigger finger.  Paula Wiggins has had a reexacerbation of her right middle trigger finger.  Over the last year she has had 2 separate corticosteroid injections which settle down her pain for a few months, but the last 1 only lasted slightly over 1 month.  She did have to return to work that very next day.  She is getting daily triggering episodes.  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  She does have a custom splint and has seen Sp[facility] for OT in the past but her symptoms persist.  Pertinent ROS were reviewed with the patient and found to be negative unless otherwise specified above in HPI.   Assessment & Plan: Visit Diagnoses:  1. Trigger finger, right middle finger    Plan: Impression is exacerbation of right middle finger trigger finger with recurrent symptoms despite OT and corticosteroid injections.  At this point, we will send her to Dr. Erwin for evaluation of surgical release for her trigger finger.  Okay for over-the-counter anti-inflammatories and remaining in her brace until that time.  Follow-up: Return for make appt with Dr. Erwin to discuss R-mid finger trigger finger surgery .   Meds & Orders: No orders of the defined types were placed in this encounter.  No orders of the defined types were placed in this encounter.    Procedures: No procedures performed      Clinical History: No specialty comments available.  She reports that she quit smoking about 2 years ago. Her smoking use included cigarettes. She started smoking about 7 years ago. She has  never used smokeless tobacco. No results for input(s): HGBA1C, LABURIC in the last 8760 hours.  Objective:    Physical Exam  Gen: Well-appearing, in no acute distress; non-toxic CV: Well-perfused. Warm.  Resp: Breathing unlabored on room air; no wheezing. Psych: Fluid speech in conversation; appropriate affect; normal thought process  Ortho Exam - Right middle finger: Positive TTP over the A1 pulley of the right middle finger.  There is reproducible triggering with active close fist and open fist.  Cap refill less than 2 seconds.  Imaging: No results found.  Past Medical/Family/Surgical/Social History: Medications & Allergies reviewed per EMR, new medications updated. Patient Active Problem List   Diagnosis Date Noted   SOB (shortness of breath) 11/26/2018   GERD (gastroesophageal reflux disease) 03/10/2017   Mold exposure 03/10/2017   Asthma, severe persistent 02/13/2017   Past Medical History:  Diagnosis Date   Asthma    Family History  Problem Relation Age of Onset   Hypertension Mother    Asthma Mother    Seizures Father    Hypertension Father    Congestive Heart Failure Paternal Grandfather    Asthma Sister    Emphysema Maternal Grandmother    Asthma Sister    Multiple sclerosis Paternal Aunt    History reviewed. No pertinent surgical history. Social History   Occupational History   Not on file  Tobacco Use   Smoking status: Former    Current packs/day: 0.00  Types: Cigarettes    Start date: 2018    Quit date: 2023    Years since quitting: 2.0   Smokeless tobacco: Never   Tobacco comments:    Smoked for 1 month total.   Vaping Use   Vaping status: Never Used  Substance and Sexual Activity   Alcohol use: Not Currently   Drug use: No   Sexual activity: Yes    Comment: with a woman

## 2023-07-02 ENCOUNTER — Telehealth: Payer: Self-pay | Admitting: Sports Medicine

## 2023-07-02 ENCOUNTER — Ambulatory Visit: Payer: No Typology Code available for payment source | Admitting: Nurse Practitioner

## 2023-07-02 NOTE — Telephone Encounter (Signed)
The patient left a message stating she is ready to schedule hand surgery. Please advise.

## 2023-07-26 ENCOUNTER — Encounter: Payer: No Typology Code available for payment source | Admitting: Orthopedic Surgery

## 2023-07-30 ENCOUNTER — Ambulatory Visit: Payer: No Typology Code available for payment source | Admitting: Nurse Practitioner

## 2023-08-23 ENCOUNTER — Encounter: Payer: Self-pay | Admitting: Nurse Practitioner

## 2023-08-23 ENCOUNTER — Other Ambulatory Visit: Payer: Self-pay

## 2023-08-23 ENCOUNTER — Ambulatory Visit: Attending: Family Medicine | Admitting: Nurse Practitioner

## 2023-08-23 VITALS — BP 120/80 | HR 75 | Resp 20 | Ht 63.0 in | Wt 262.0 lb

## 2023-08-23 DIAGNOSIS — D72829 Elevated white blood cell count, unspecified: Secondary | ICD-10-CM

## 2023-08-23 DIAGNOSIS — R739 Hyperglycemia, unspecified: Secondary | ICD-10-CM | POA: Diagnosis not present

## 2023-08-23 DIAGNOSIS — R7303 Prediabetes: Secondary | ICD-10-CM | POA: Diagnosis not present

## 2023-08-23 DIAGNOSIS — J4520 Mild intermittent asthma, uncomplicated: Secondary | ICD-10-CM

## 2023-08-23 DIAGNOSIS — B351 Tinea unguium: Secondary | ICD-10-CM

## 2023-08-23 DIAGNOSIS — M25561 Pain in right knee: Secondary | ICD-10-CM

## 2023-08-23 DIAGNOSIS — G8929 Other chronic pain: Secondary | ICD-10-CM

## 2023-08-23 LAB — GLUCOSE, POCT (MANUAL RESULT ENTRY): POC Glucose: 134 mg/dL — AB (ref 70–99)

## 2023-08-23 MED ORDER — FLUTICASONE-SALMETEROL 500-50 MCG/ACT IN AEPB
1.0000 | INHALATION_SPRAY | Freq: Two times a day (BID) | RESPIRATORY_TRACT | 6 refills | Status: AC
  Filled 2023-08-23: qty 60, 30d supply, fill #0

## 2023-08-23 MED ORDER — ALBUTEROL SULFATE HFA 108 (90 BASE) MCG/ACT IN AERS
2.0000 | INHALATION_SPRAY | Freq: Four times a day (QID) | RESPIRATORY_TRACT | 1 refills | Status: AC | PRN
  Filled 2023-08-23: qty 6.7, 25d supply, fill #0
  Filled 2024-01-03: qty 6.7, 25d supply, fill #1

## 2023-08-23 MED ORDER — ALBUTEROL SULFATE (2.5 MG/3ML) 0.083% IN NEBU
3.0000 mL | INHALATION_SOLUTION | Freq: Four times a day (QID) | RESPIRATORY_TRACT | 3 refills | Status: AC | PRN
  Filled 2023-08-23: qty 90, 8d supply, fill #0

## 2023-08-23 MED ORDER — EPINEPHRINE 0.3 MG/0.3ML IJ SOAJ
0.3000 mg | INTRAMUSCULAR | 0 refills | Status: AC | PRN
  Filled 2023-08-23: qty 2, 30d supply, fill #0

## 2023-08-23 NOTE — Patient Instructions (Signed)
 [facility] [address]  [phone]

## 2023-08-23 NOTE — Progress Notes (Signed)
 Assessment & Plan:  Paula Wiggins was seen today for asthma and hyperglycemia.  Diagnoses and all orders for this visit:  Prediabetes -     Hemoglobin A1c -     CMP14+EGFR  Leukocytosis, unspecified type -     CBC with Differential  Elevated blood sugar -     POCT glucose (manual entry)  Mild intermittent asthma in adult without complication Well controlled.  -     fluticasone-salmeterol (WIXELA INHUB) 500-50 MCG/ACT AEPB; Inhale 1 puff into the lungs in the morning and at bedtime. -     albuterol (PROVENTIL) (2.5 MG/3ML) 0.083% nebulizer solution; Take 3 mLs by nebulization every 6 (six) hours as needed. -     albuterol (VENTOLIN HFA) 108 (90 Base) MCG/ACT inhaler; Inhale 2 puffs into the lungs once every 6 (six) hours as needed. -     EPINEPHrine 0.3 mg/0.3 mL IJ SOAJ injection; Inject 0.3 mg into the muscle as needed for anaphylaxis.  Onychomycosis -     Ambulatory referral to Podiatry  Chronic pain of right knee -     DG Knee Complete 4 Views Right; Future Work on losing weight to help reduce joint pain. May alternate with heat and ice application for pain relief. May also alternate with acetaminophen and Ibuprofen as prescribed pain relief. Other alternatives include massage, acupuncture and water aerobics.    Patient has been counseled on age-appropriate routine health concerns for screening and prevention. These are reviewed and up-to-date. Referrals have been placed accordingly. Immunizations are up-to-date or declined.    Subjective:   Chief Complaint  Patient presents with   Asthma   Hyperglycemia    Paula Wiggins 33 y.o. female presents to office today for follow up to prediabetes.   She has a past medical history of Asthma and prediabetes.    She states she was told her manager that her blood pressure was elevated as well as her glucose however she can not recall any specific readings.   Prediabetes Well controlled at this time without the use of oral  medications.  Lab Results  Component Value Date   HGBA1C 5.6 02/03/2021      Blood pressure is well controlled.  BP Readings from Last 3 Encounters:  08/23/23 120/80  03/19/23 119/87  08/05/22 107/70     Knee Pain: Patient presents for follow up on a knee problem involving the  right knee. Onset of the symptoms was several months ago. Inciting event: none known. Current symptoms include pain located behind the knee, stiffness, and swelling. Pain is aggravated by any weight bearing, going up and down stairs, inactivity, kneeling, rising after sitting, squatting, and walking.  Patient has had no prior knee problems. Evaluation to date: none. Treatment to date: none.    She has toenail fungus of the right great toe and the right 4th and 5th toenails. She has used penlac over the counter with no improvement. Associated symptoms: right great toe numbness.     HPI  Review of Systems  Constitutional:  Negative for fever, malaise/fatigue and weight loss.  HENT: Negative.  Negative for nosebleeds.   Eyes: Negative.  Negative for blurred vision, double vision and photophobia.  Respiratory: Negative.  Negative for cough and shortness of breath.   Cardiovascular: Negative.  Negative for chest pain, palpitations and leg swelling.  Gastrointestinal: Negative.  Negative for heartburn, nausea and vomiting.  Musculoskeletal: Negative.  Negative for myalgias.  Skin:        onychomycosis  Neurological:  Negative.  Negative for dizziness, focal weakness, seizures and headaches.  Psychiatric/Behavioral: Negative.  Negative for suicidal ideas.     Past Medical History:  Diagnosis Date   Asthma     No past surgical history on file.  Family History  Problem Relation Age of Onset   Hypertension Mother    Asthma Mother    Seizures Father    Hypertension Father    Congestive Heart Failure Paternal Grandfather    Asthma Sister    Emphysema Maternal Grandmother    Asthma Sister    Multiple  sclerosis Paternal Aunt     Social History Reviewed with no changes to be made today.   Outpatient Medications Prior to Visit  Medication Sig Dispense Refill   amitriptyline (ELAVIL) 10 MG tablet Take 1 tablet (10 mg total) by mouth at bedtime. For headaches and depression 90 tablet 1   cetirizine (ZYRTEC) 10 MG tablet Take 1 tablet (10 mg total) by mouth daily. 90 tablet 3   montelukast (SINGULAIR) 10 MG tablet Take 1 tablet (10 mg total) by mouth at bedtime. 90 tablet 3   omeprazole (PRILOSEC) 20 MG capsule Take 1 capsule (20 mg total) by mouth daily. 90 capsule 3   senna-docusate (SENOKOT-S) 8.6-50 MG tablet Take 2 tablets by mouth daily. 180 tablet 1   albuterol (PROVENTIL) (2.5 MG/3ML) 0.083% nebulizer solution Take 3 mLs by nebulization every 6 (six) hours as needed. 90 mL 3   albuterol (VENTOLIN HFA) 108 (90 Base) MCG/ACT inhaler Inhale 2 puffs into the lungs once every 6 (six) hours as needed. 6.7 g 1   benzonatate (TESSALON) 100 MG capsule Take 1 capsule (100 mg total) by mouth every 8 (eight) hours. 21 capsule 0   fluticasone-salmeterol (WIXELA INHUB) 500-50 MCG/ACT AEPB Inhale 1 puff into the lungs in the morning and at bedtime. 60 each 6   EPINEPHrine 0.3 mg/0.3 mL IJ SOAJ injection Inject 0.3 mg into the muscle as needed for anaphylaxis. (Patient not taking: Reported on 08/23/2023) 1 each 0   No facility-administered medications prior to visit.    Allergies  Allergen Reactions   Oxycodone Anaphylaxis, Shortness Of Breath and Swelling   Shrimp Flavor Agent (Non-Screening)    Watermelon Flavoring Agent (Non-Screening) Swelling and Other (See Comments)    Swelling of tongue and lips break out       Objective:    BP 120/80 (BP Location: Left Arm, Patient Position: Sitting, Cuff Size: Normal)   Pulse 75   Resp 20   Ht 5\' 3"  (1.6 m)   Wt 262 lb (118.8 kg)   SpO2 100%   BMI 46.41 kg/m  Wt Readings from Last 3 Encounters:  08/23/23 262 lb (118.8 kg)  03/18/23 235 lb  (106.6 kg)  08/05/22 258 lb 6.4 oz (117.2 kg)    Physical Exam Vitals and nursing note reviewed.  Constitutional:      Appearance: She is well-developed.  HENT:     Head: Normocephalic and atraumatic.  Cardiovascular:     Rate and Rhythm: Normal rate and regular rhythm.     Heart sounds: Normal heart sounds. No murmur heard.    No friction rub. No gallop.  Pulmonary:     Effort: Pulmonary effort is normal. No tachypnea or respiratory distress.     Breath sounds: Normal breath sounds. No decreased breath sounds, wheezing, rhonchi or rales.  Chest:     Chest wall: No tenderness.  Abdominal:     General: Bowel sounds are normal.  Palpations: Abdomen is soft.  Musculoskeletal:        General: Normal range of motion.     Cervical back: Normal range of motion.  Feet:     Right foot:     Toenail Condition: Fungal disease present.    Left foot:     Toenail Condition: Fungal disease present. Skin:    General: Skin is warm and dry.  Neurological:     Mental Status: She is alert and oriented to person, place, and time.     Coordination: Coordination normal.  Psychiatric:        Behavior: Behavior normal. Behavior is cooperative.        Thought Content: Thought content normal.        Judgment: Judgment normal.          Patient has been counseled extensively about nutrition and exercise as well as the importance of adherence with medications and regular follow-up. The patient was given clear instructions to go to ER or return to medical center if symptoms don't improve, worsen or new problems develop. The patient verbalized understanding.   Follow-up: Return in about 6 months (around 02/22/2024).   Claiborne Rigg, FNP-BC [facility] [place], [place] [phone]   08/23/2023, 3:23 PM

## 2023-08-24 ENCOUNTER — Other Ambulatory Visit (INDEPENDENT_AMBULATORY_CARE_PROVIDER_SITE_OTHER): Payer: Self-pay

## 2023-08-24 ENCOUNTER — Encounter: Payer: Self-pay | Admitting: Nurse Practitioner

## 2023-08-24 ENCOUNTER — Ambulatory Visit: Admitting: Orthopedic Surgery

## 2023-08-24 ENCOUNTER — Other Ambulatory Visit: Payer: Self-pay

## 2023-08-24 DIAGNOSIS — M65331 Trigger finger, right middle finger: Secondary | ICD-10-CM | POA: Diagnosis not present

## 2023-08-24 LAB — CBC WITH DIFFERENTIAL/PLATELET
Basophils Absolute: 0.1 10*3/uL (ref 0.0–0.2)
Basos: 1 %
EOS (ABSOLUTE): 0.1 10*3/uL (ref 0.0–0.4)
Eos: 1 %
Hematocrit: 39.8 % (ref 34.0–46.6)
Hemoglobin: 13.5 g/dL (ref 11.1–15.9)
Immature Grans (Abs): 0 10*3/uL (ref 0.0–0.1)
Immature Granulocytes: 0 %
Lymphocytes Absolute: 2.6 10*3/uL (ref 0.7–3.1)
Lymphs: 25 %
MCH: 32.3 pg (ref 26.6–33.0)
MCHC: 33.9 g/dL (ref 31.5–35.7)
MCV: 95 fL (ref 79–97)
Monocytes Absolute: 0.5 10*3/uL (ref 0.1–0.9)
Monocytes: 5 %
Neutrophils Absolute: 7.2 10*3/uL — ABNORMAL HIGH (ref 1.4–7.0)
Neutrophils: 68 %
Platelets: 370 10*3/uL (ref 150–450)
RBC: 4.18 x10E6/uL (ref 3.77–5.28)
RDW: 12.2 % (ref 11.7–15.4)
WBC: 10.5 10*3/uL (ref 3.4–10.8)

## 2023-08-24 LAB — HEMOGLOBIN A1C
Est. average glucose Bld gHb Est-mCnc: 117 mg/dL
Hgb A1c MFr Bld: 5.7 % — ABNORMAL HIGH (ref 4.8–5.6)

## 2023-08-24 LAB — CMP14+EGFR
ALT: 10 IU/L (ref 0–32)
AST: 18 IU/L (ref 0–40)
Albumin: 4.4 g/dL (ref 3.9–4.9)
Alkaline Phosphatase: 97 IU/L (ref 44–121)
BUN/Creatinine Ratio: 12 (ref 9–23)
BUN: 9 mg/dL (ref 6–20)
Bilirubin Total: 0.3 mg/dL (ref 0.0–1.2)
CO2: 20 mmol/L (ref 20–29)
Calcium: 9.2 mg/dL (ref 8.7–10.2)
Chloride: 104 mmol/L (ref 96–106)
Creatinine, Ser: 0.77 mg/dL (ref 0.57–1.00)
Globulin, Total: 3.1 g/dL (ref 1.5–4.5)
Glucose: 89 mg/dL (ref 70–99)
Potassium: 4.2 mmol/L (ref 3.5–5.2)
Sodium: 140 mmol/L (ref 134–144)
Total Protein: 7.5 g/dL (ref 6.0–8.5)
eGFR: 105 mL/min/{1.73_m2} (ref >59)

## 2023-08-24 NOTE — Progress Notes (Signed)
 Paula Wiggins - 33 y.o. female MRN [id number]  Date of birth: April 14, 1991  Office Visit Note: Visit Date: 08/24/2023 PCP: Claiborne Rigg, NP Referred by: Claiborne Rigg, NP  Subjective: No chief complaint on file.  HPI: Paula Wiggins is a pleasant 33 y.o. female who presents today as a referral from my partner Dr. Shon Baton for ongoing right long finger trigger digit that is refractory to conservative care.  She has undergone injections x 3 to the right long finger with recurrence of symptoms.  She is also done trigger splinting and work with occupational therapy without lasting relief.  She works as a Financial risk analyst and is having difficulty with her tasks given the ongoing triggering and locking of the digit.  She states that she is having locking of the digit on a regular basis now requiring manual correction.  She is interested in potential surgical intervention.  Pertinent ROS were reviewed with the patient and found to be negative unless otherwise specified above in HPI.   Visit Reason: right long finger trigger digit Duration of symptoms: 2 years Hand dominance: right Occupation: cook Diabetic: yes/ 5.7 Smoking: No Heart/Lung History: asthma Blood Thinners:  none  Prior Testing/EMG: none Injections (Date): 12/08/22  Treatments: injections x3, NSAIDs Prior Surgery: none  Assessment & Plan: Visit Diagnoses:  1. Trigger finger, right middle finger     Plan: Extensive discussion was had with the patient today regarding her right long finger trigger digit.  We discussed the etiology and pathophysiology of stenosing tenosynovitis.  We discussed conservative versus surgical treatment modalities.  From a conservative standpoint, we discussed activity modification, splinting, therapy and injections.  From a surgical standpoint, we discussed the possibility for trigger digit release as well as all risk and benefits associated.  Given that she has trialed conservative treatments  with therapy, trigger splinting, and multiple cortisone injections to the right long finger finger A1 pulley for symptom relief with symptoms refractory to conservative care, patient is indicated for right long finger trigger digit release.    Risks and benefits of the procedure were discussed, risks including but not limited to infection, bleeding, scarring, stiffness, nerve injury, tendon injury, vascular injury, recurrence of symptoms and need for subsequent operation.  We also discussed the appropriate postoperative protocol and timeframe for return to activities and function.  Forms of anesthesia were also discussed.  Patient expressed understanding.  Understanding the above, she would like to proceed with right long finger trigger digit release under IV sedation.  We will move forward with surgical scheduling at a date of the patient's choosing.    Follow-up: No follow-ups on file.   Meds & Orders: No orders of the defined types were placed in this encounter.   Orders Placed This Encounter  Procedures   XR Hand Complete Right     Procedures: No procedures performed      Clinical History: No specialty comments available.  She reports that she quit smoking about 2 years ago. Her smoking use included cigarettes. She started smoking about 7 years ago. She has never used smokeless tobacco.  Recent Labs    08/23/23 1543  HGBA1C 5.7*    Objective:   Vital Signs: There were no vitals taken for this visit.  Physical Exam  Gen: Well-appearing, in no acute distress; non-toxic CV: Regular Rate. Well-perfused. Warm.  Resp: Breathing unlabored on room air; no wheezing. Psych: Fluid speech in conversation; appropriate affect; normal thought process  Ortho Exam Right  hand: - Palpable nodule at the A1 pulley of the long finger, associated tenderness - Notable clicking with deep flexion of the long finger, there is evidence of significant locking with deep flexion, requires manual  correction on examination today - Sensation intact distally, hand remains warm well-perfused   Imaging: XR Hand Complete Right Result Date: 08/24/2023 There is no evidence of fracture or dislocation. There is no evidence of arthropathy or other focal bone abnormality. Soft tissues are unremarkable.    Past Medical/Family/Surgical/Social History: Medications & Allergies reviewed per EMR, new medications updated. Patient Active Problem List   Diagnosis Date Noted   SOB (shortness of breath) 11/26/2018   GERD (gastroesophageal reflux disease) 03/10/2017   Mold exposure 03/10/2017   Asthma, severe persistent 02/13/2017   Past Medical History:  Diagnosis Date   Asthma    Family History  Problem Relation Age of Onset   Hypertension Mother    Asthma Mother    Seizures Father    Hypertension Father    Congestive Heart Failure Paternal Grandfather    Asthma Sister    Emphysema Maternal Grandmother    Asthma Sister    Multiple sclerosis Paternal Aunt    No past surgical history on file. Social History   Occupational History   Not on file  Tobacco Use   Smoking status: Former    Current packs/day: 0.00    Types: Cigarettes    Start date: 2018    Quit date: 2023    Years since quitting: 2.2   Smokeless tobacco: Never   Tobacco comments:    Smoked for 1 month total.   Vaping Use   Vaping status: Never Used  Substance and Sexual Activity   Alcohol use: Not Currently   Drug use: No   Sexual activity: Yes    Comment: "with a woman"    Lya Holben Fara Boros) Denese Killings, M.D. [facility], Hand Surgery

## 2023-08-27 ENCOUNTER — Other Ambulatory Visit: Payer: Self-pay

## 2023-09-13 ENCOUNTER — Ambulatory Visit (INDEPENDENT_AMBULATORY_CARE_PROVIDER_SITE_OTHER): Admitting: Podiatry

## 2023-09-13 DIAGNOSIS — Z91199 Patient's noncompliance with other medical treatment and regimen due to unspecified reason: Secondary | ICD-10-CM

## 2023-09-13 NOTE — Progress Notes (Signed)
 No show

## 2023-09-16 ENCOUNTER — Encounter: Admitting: Orthopedic Surgery

## 2023-11-11 ENCOUNTER — Encounter (HOSPITAL_COMMUNITY): Payer: Self-pay

## 2023-11-11 ENCOUNTER — Emergency Department (HOSPITAL_COMMUNITY)

## 2023-11-11 ENCOUNTER — Emergency Department (HOSPITAL_COMMUNITY)
Admission: EM | Admit: 2023-11-11 | Discharge: 2023-11-11 | Disposition: A | Discharge status: Home / Self Care | Attending: Emergency Medicine | Admitting: Emergency Medicine

## 2023-11-11 DIAGNOSIS — M25511 Pain in right shoulder: Secondary | ICD-10-CM | POA: Diagnosis present

## 2023-11-11 MED ORDER — NAPROXEN 500 MG PO TABS
500.0000 mg | ORAL_TABLET | Freq: Two times a day (BID) | ORAL | 0 refills | Status: AC
Start: 2023-11-11 — End: 2023-11-18
  Filled 2023-11-11: qty 14, 7d supply, fill #0

## 2023-11-11 NOTE — Discharge Instructions (Addendum)
 We discussed the results of your x-ray on today's visit.  You will go home on a short course of anti-inflammatories to help with your pain, please take 1 tablet twice a day for the next 7 days with food.  Follow-up with orthopedics at your earliest convenient for further evaluation of your right shoulder pain.

## 2023-11-11 NOTE — ED Triage Notes (Signed)
 Pt reports that she since Monday she has been having R shoulder pain since she was lifting heavy things. Feels like it is tearing

## 2023-11-11 NOTE — ED Provider Notes (Signed)
 [facility] EMERGENCY DEPARTMENT AT [facility] Provider Note   CSN: [id number] Arrival date & time: 11/11/23  2029     Patient presents with: Shoulder Pain   Paula Wiggins is a 33 y.o. female.   75 old female with no PMH presents to the ED with a chief complaint of right shoulder pain has been ongoing for the past 4 days.  Reports she was lifting heavy weights, felt something tear, since then anytime she elevates the right shoulder or attempts to lift anything heavy worsens pain.  She has tried taking some ibuprofen  to help with pain without much improvement in symptoms.  With any sort of movement the pain does get worse.  No fevers, no trauma, no other complaint reported.  The history is provided by the patient.  Shoulder Pain Associated symptoms: no fever        Prior to Admission medications   Medication Sig Start Date End Date Taking? Authorizing Provider  naproxen  (NAPROSYN ) 500 MG tablet Take 1 tablet (500 mg total) by mouth 2 (two) times daily for 7 days. 11/11/23 11/18/23 Yes Slyvester Latona, PA-C  albuterol  (PROVENTIL ) (2.5 MG/3ML) 0.083% nebulizer solution Take 3 mLs by nebulization every 6 (six) hours as needed. 08/23/23 08/22/24  Fleming, Zelda W, NP  albuterol  (VENTOLIN  HFA) 108 (90 Base) MCG/ACT inhaler Inhale 2 puffs into the lungs once every 6 (six) hours as needed. 08/23/23   Fleming, Zelda W, NP  amitriptyline  (ELAVIL ) 10 MG tablet Take 1 tablet (10 mg total) by mouth at bedtime. For headaches and depression 08/05/22   Theotis Haze ORN, NP  cetirizine  (ZYRTEC ) 10 MG tablet Take 1 tablet (10 mg total) by mouth daily. 08/05/22   Fleming, Zelda W, NP  EPINEPHrine  0.3 mg/0.3 mL IJ SOAJ injection Inject 0.3 mg into the muscle as needed for anaphylaxis. 08/23/23   Fleming, Zelda W, NP  fluticasone -salmeterol (WIXELA INHUB ) 500-50 MCG/ACT AEPB Inhale 1 puff into the lungs in the morning and at bedtime. 08/23/23   Fleming, Zelda W, NP  montelukast  (SINGULAIR ) 10 MG tablet Take  1 tablet (10 mg total) by mouth at bedtime. 08/05/22   Fleming, Zelda W, NP  omeprazole  (PRILOSEC) 20 MG capsule Take 1 capsule (20 mg total) by mouth daily. 08/05/22   Fleming, Zelda W, NP  senna-docusate (SENOKOT-S) 8.6-50 MG tablet Take 2 tablets by mouth daily. 08/05/22   Fleming, Zelda W, NP    Allergies: Oxycodone, Shrimp flavor agent (non-screening), and Watermelon flavoring agent (non-screening)    Review of Systems  Constitutional:  Negative for fever.  Musculoskeletal:  Positive for arthralgias.    Updated Vital Signs BP 131/83   Pulse (!) 102   Temp 98.7 F (37.1 C) (Oral)   Resp 18   SpO2 100%   Physical Exam Vitals and nursing note reviewed.  Constitutional:      Appearance: Normal appearance.  HENT:     Head: Normocephalic and atraumatic.     Mouth/Throat:     Mouth: Mucous membranes are moist.   Cardiovascular:     Rate and Rhythm: Normal rate.  Pulmonary:     Effort: Pulmonary effort is normal.  Abdominal:     General: Abdomen is flat.     Palpations: Abdomen is soft.     Tenderness: There is no abdominal tenderness.   Musculoskeletal:        General: Tenderness present. No deformity.     Right shoulder: Tenderness present. No bony tenderness. Decreased range of motion. Normal  strength. Normal pulse.     Cervical back: Normal range of motion and neck supple.     Comments: Decreased range of motion due to pain but good right shoulder extension and flexion.  No bony tenderness.   Skin:    General: Skin is warm and dry.   Neurological:     Mental Status: She is alert and oriented to person, place, and time.     (all labs ordered are listed, but only abnormal results are displayed) Labs Reviewed - No data to display  EKG: None  Radiology: DG Shoulder Right Result Date: 11/11/2023 CLINICAL DATA:  Shoulder pain. EXAM: RIGHT SHOULDER - 2+ VIEW COMPARISON:  Right shoulder radiographs dated 01/20/2011. FINDINGS: There is no evidence of fracture or  dislocation. There is no evidence of arthropathy or other focal bone abnormality. Soft tissues are unremarkable. IMPRESSION: No acute osseous abnormality. Electronically Signed   By: Harrietta Sherry M.D.   On: 11/11/2023 21:47     Procedures   Medications Ordered in the ED - No data to display                                  Medical Decision Making Amount and/or Complexity of Data Reviewed Radiology: ordered.    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  Has taken some ibuprofen  without any improvement in symptoms.  Worsening pain with any kind of range of motion, decreased pulling, although otherwise neurovascularly intact.  X-ray without any sign of fracture or dislocation noted.  Suspect likely a ligament to injury.  Will have her follow-up with orthopedics, also go home in a short course of anti-inflammatories.  Agreeable to plan treatment, hemodynamically stable for discharge.   Portions of this note were generated with Scientist, clinical (histocompatibility and immunogenetics). Dictation errors may occur despite best attempts at proofreading.      Final diagnoses:  Acute pain of right shoulder    ED Discharge Orders          Ordered    naproxen  (NAPROSYN ) 500 MG tablet  2 times daily        11/11/23 2159               Ciana Simmon, PA-C 11/11/23 2202    Dean Clarity, MD 11/11/23 2209

## 2023-11-12 ENCOUNTER — Other Ambulatory Visit: Payer: Self-pay

## 2023-11-24 ENCOUNTER — Other Ambulatory Visit: Payer: Self-pay

## 2024-01-07 ENCOUNTER — Other Ambulatory Visit: Payer: Self-pay

## 2024-01-12 ENCOUNTER — Other Ambulatory Visit: Payer: Self-pay

## 2024-01-19 ENCOUNTER — Ambulatory Visit (HOSPITAL_COMMUNITY)
Admission: EM | Admit: 2024-01-19 | Discharge: 2024-01-19 | Disposition: A | Discharge status: Home / Self Care | Source: Ambulatory Visit | Attending: Internal Medicine | Admitting: Internal Medicine

## 2024-01-19 ENCOUNTER — Encounter (HOSPITAL_COMMUNITY): Payer: Self-pay

## 2024-01-19 ENCOUNTER — Ambulatory Visit (INDEPENDENT_AMBULATORY_CARE_PROVIDER_SITE_OTHER)

## 2024-01-19 DIAGNOSIS — M25562 Pain in left knee: Secondary | ICD-10-CM | POA: Diagnosis not present

## 2024-01-19 MED ORDER — KETOROLAC TROMETHAMINE 30 MG/ML IJ SOLN
30.0000 mg | Freq: Once | INTRAMUSCULAR | Status: AC
  Administered 2024-01-19: 30 mg via INTRAMUSCULAR

## 2024-01-19 MED ORDER — KETOROLAC TROMETHAMINE 30 MG/ML IJ SOLN
INTRAMUSCULAR | Status: AC
  Filled 2024-01-19: qty 1

## 2024-01-19 NOTE — ED Triage Notes (Signed)
 Pt present with lt knee pain  x today. State she stepped out of her SUV and when she took a wrong step her knee started hurting. States the pain radiates to the lower leg.

## 2024-01-19 NOTE — Medical Student Note (Signed)
 [facility] Insurance account manager Note For educational purposes for Medical, PA and NP students only and not part of the legal medical record.   CSN: [id number] Arrival date & time: 01/19/24  1404      History   Chief Complaint Chief Complaint  Patient presents with   Knee Injury    HPI Paula Wiggins is a 33 y.o. female.  Pt presents with pain to her L knee that started after she hyperextended it stepping out of her SUV today. She heard a pop at the time of injury. She has been having severe pain when attempting to bear weight on the L knee. Pain is located to the anterior aspect with radiation of the pain distally. She denies any previous injuries or surgeries to that knee.   Pt is of childbearing age and LMP was 01/10/24. Denies chance of pregnancy due to be sexually active with only her female partner.   The history is provided by the patient.    Past Medical History:  Diagnosis Date   Asthma     Patient Active Problem List   Diagnosis Date Noted   SOB (shortness of breath) 11/26/2018   GERD (gastroesophageal reflux disease) 03/10/2017   Mold exposure 03/10/2017   Asthma, severe persistent 02/13/2017    History reviewed. No pertinent surgical history.  OB History   No obstetric history on file.      Home Medications    Prior to Admission medications   Medication Sig Start Date End Date Taking? Authorizing Provider  albuterol  (PROVENTIL ) (2.5 MG/3ML) 0.083% nebulizer solution Take 3 mLs by nebulization every 6 (six) hours as needed. 08/23/23 08/22/24  Fleming, Zelda W, NP  albuterol  (VENTOLIN  HFA) 108 (90 Base) MCG/ACT inhaler Inhale 2 puffs into the lungs once every 6 (six) hours as needed. 08/23/23   Fleming, Zelda W, NP  amitriptyline  (ELAVIL ) 10 MG tablet Take 1 tablet (10 mg total) by mouth at bedtime. For headaches and depression 08/05/22   Theotis Haze ORN, NP  cetirizine  (ZYRTEC ) 10 MG tablet Take 1 tablet (10 mg total) by mouth daily. 08/05/22    Fleming, Zelda W, NP  EPINEPHrine  0.3 mg/0.3 mL IJ SOAJ injection Inject 0.3 mg into the muscle as needed for anaphylaxis. 08/23/23   Fleming, Zelda W, NP  fluticasone -salmeterol (WIXELA INHUB ) 500-50 MCG/ACT AEPB Inhale 1 puff into the lungs in the morning and at bedtime. 08/23/23   Fleming, Zelda W, NP  montelukast  (SINGULAIR ) 10 MG tablet Take 1 tablet (10 mg total) by mouth at bedtime. 08/05/22   Fleming, Zelda W, NP  omeprazole  (PRILOSEC) 20 MG capsule Take 1 capsule (20 mg total) by mouth daily. 08/05/22   Fleming, Zelda W, NP  senna-docusate (SENOKOT-S) 8.6-50 MG tablet Take 2 tablets by mouth daily. 08/05/22   Theotis Haze ORN, NP    Family History Family History  Problem Relation Age of Onset   Hypertension Mother    Asthma Mother    Seizures Father    Hypertension Father    Congestive Heart Failure Paternal Grandfather    Asthma Sister    Emphysema Maternal Grandmother    Asthma Sister    Multiple sclerosis Paternal Aunt     Social History Social History   Tobacco Use   Smoking status: Former    Current packs/day: 0.00    Types: Cigarettes    Start date: 2018    Quit date: 2023    Years since quitting: 2.6   Smokeless tobacco: Never  Tobacco comments:    Smoked for 1 month total.   Vaping Use   Vaping status: Never Used  Substance Use Topics   Alcohol use: Not Currently   Drug use: No     Allergies   Oxycodone, Shrimp flavor agent (non-screening), and Watermelon flavoring agent (non-screening)   Review of Systems Review of Systems  Musculoskeletal: Negative.        Denies numbness or tingling distally. Radiation of the L knee pain distally.     Physical Exam Updated Vital Signs BP 119/88 (BP Location: Left Arm)   Pulse 100   Temp 98.1 F (36.7 C) (Oral)   Resp 17   LMP  (LMP Unknown)   SpO2 98%   Physical Exam Constitutional:      Appearance: She is obese.  Cardiovascular:     Rate and Rhythm: Normal rate and regular rhythm.     Heart sounds:  Normal heart sounds.  Pulmonary:     Effort: Pulmonary effort is normal.     Breath sounds: Normal breath sounds.  Musculoskeletal:     Left lower leg: Tenderness (tenderness to the anterior aspect superior and inferior pateller ligament.) present. No swelling.     Comments: Laxity noted on anterior drawer test of the L knee with crepitus. Nonspecific diffuse tenderness to the anterior L knee on valgus and varus.   Lower leg strength 5/5 on the R and 4/5 on the L. DP and PT pulse 2+ bilat.       ED Treatments / Results  Labs (all labs ordered are listed, but only abnormal results are displayed) Labs Reviewed - No data to display  EKG  Radiology DG Knee Complete 4 Views Left Result Date: 01/19/2024 CLINICAL DATA:  Left knee pain.  Pain when walking. EXAM: LEFT KNEE - COMPLETE 4+ VIEW COMPARISON:  None Available. FINDINGS: No evidence of fracture, dislocation, or joint effusion. Alignment and joint spaces are normal. No evidence of arthropathy or other focal bone abnormality. Soft tissues are unremarkable. IMPRESSION: Negative radiographs of the left knee. Electronically Signed   By: Andrea Gasman M.D.   On: 01/19/2024 16:25    Procedures Procedures (including critical care time)  Medications Ordered in ED Medications  ketorolac  (TORADOL ) 30 MG/ML injection 30 mg (has no administration in time range)     Initial Impression / Assessment and Plan / ED Course  I have reviewed the triage vital signs and the nursing notes.  Pertinent labs & imaging results that were available during my care of the patient were reviewed by me and considered in my medical decision making (see chart for details).     Radiographs performed in office is negative for bony abnormality. Will give 30 mg of ketorolac  IM in the office today. L knee to be placed in immobilizer and pt to be given crutches. Will refer to ortho for evaluation of ligamentous injury. Instructed patient on use of ibuprofen  600 mg q  6 hours PRN for pain and to apply cryotherapy. Return precautions given.   Final Clinical Impressions(s) / ED Diagnoses   Final diagnoses:  Acute pain of left knee    New Prescriptions New Prescriptions   No medications on file

## 2024-01-19 NOTE — ED Provider Notes (Signed)
 [facility]    CSN: [id number] Arrival date & time: 01/19/24  1404      History   Chief Complaint Chief Complaint  Patient presents with   Knee Injury    HPI Paula Wiggins is a 33 y.o. female.   Pt presents with pain to her L knee that started after she hyperextended it stepping out of her SUV today. She heard a pop at the time of injury. She has been having severe pain when attempting to bear weight on the L knee. Pain is located to the anterior aspect with radiation of the pain distally. She denies any previous injuries or surgeries to that knee. Denies paresthesias distally to injury, swelling of the left calf, erythema to the overlying skin of the left knee.   LMP was 01/10/24, denies chance of pregnancy.   The history is provided by the patient.   I saw this patient with Juliene Daring, FNP student. I was present for entirety of patient encounter.      Past Medical History:  Diagnosis Date   Asthma     Patient Active Problem List   Diagnosis Date Noted   SOB (shortness of breath) 11/26/2018   GERD (gastroesophageal reflux disease) 03/10/2017   Mold exposure 03/10/2017   Asthma, severe persistent 02/13/2017    History reviewed. No pertinent surgical history.  OB History   No obstetric history on file.      Home Medications    Prior to Admission medications   Medication Sig Start Date End Date Taking? Authorizing Provider  albuterol  (PROVENTIL ) (2.5 MG/3ML) 0.083% nebulizer solution Take 3 mLs by nebulization every 6 (six) hours as needed. 08/23/23 08/22/24  Fleming, Zelda W, NP  albuterol  (VENTOLIN  HFA) 108 (90 Base) MCG/ACT inhaler Inhale 2 puffs into the lungs once every 6 (six) hours as needed. 08/23/23   Fleming, Zelda W, NP  amitriptyline  (ELAVIL ) 10 MG tablet Take 1 tablet (10 mg total) by mouth at bedtime. For headaches and depression 08/05/22   Theotis Haze ORN, NP  cetirizine  (ZYRTEC ) 10 MG tablet Take 1 tablet (10 mg total) by mouth daily.  08/05/22   Fleming, Zelda W, NP  EPINEPHrine  0.3 mg/0.3 mL IJ SOAJ injection Inject 0.3 mg into the muscle as needed for anaphylaxis. 08/23/23   Fleming, Zelda W, NP  fluticasone -salmeterol (WIXELA INHUB ) 500-50 MCG/ACT AEPB Inhale 1 puff into the lungs in the morning and at bedtime. 08/23/23   Fleming, Zelda W, NP  montelukast  (SINGULAIR ) 10 MG tablet Take 1 tablet (10 mg total) by mouth at bedtime. 08/05/22   Fleming, Zelda W, NP  omeprazole  (PRILOSEC) 20 MG capsule Take 1 capsule (20 mg total) by mouth daily. 08/05/22   Fleming, Zelda W, NP  senna-docusate (SENOKOT-S) 8.6-50 MG tablet Take 2 tablets by mouth daily. 08/05/22   Theotis Haze ORN, NP    Family History Family History  Problem Relation Age of Onset   Hypertension Mother    Asthma Mother    Seizures Father    Hypertension Father    Congestive Heart Failure Paternal Grandfather    Asthma Sister    Emphysema Maternal Grandmother    Asthma Sister    Multiple sclerosis Paternal Aunt     Social History Social History   Tobacco Use   Smoking status: Former    Current packs/day: 0.00    Types: Cigarettes    Start date: 2018    Quit date: 2023    Years since quitting: 2.6  Smokeless tobacco: Never   Tobacco comments:    Smoked for 1 month total.   Vaping Use   Vaping status: Never Used  Substance Use Topics   Alcohol use: Not Currently   Drug use: No     Allergies   Oxycodone, Shrimp flavor agent (non-screening), and Watermelon flavoring agent (non-screening)   Review of Systems Review of Systems Per HPI  Physical Exam Triage Vital Signs ED Triage Vitals  Encounter Vitals Group     BP 01/19/24 1531 119/88     Girls Systolic BP Percentile --      Girls Diastolic BP Percentile --      Boys Systolic BP Percentile --      Boys Diastolic BP Percentile --      Pulse Rate 01/19/24 1531 100     Resp 01/19/24 1531 17     Temp 01/19/24 1531 98.1 F (36.7 C)     Temp Source 01/19/24 1531 Oral     SpO2 01/19/24 1531  98 %     Weight --      Height --      Head Circumference --      Peak Flow --      Pain Score 01/19/24 1530 10     Pain Loc --      Pain Education --      Exclude from Growth Chart --    No data found.  Updated Vital Signs BP 119/88 (BP Location: Left Arm)   Pulse 100   Temp 98.1 F (36.7 C) (Oral)   Resp 17   LMP  (LMP Unknown)   SpO2 98%   Visual Acuity Right Eye Distance:   Left Eye Distance:   Bilateral Distance:    Right Eye Near:   Left Eye Near:    Bilateral Near:     Physical Exam Vitals and nursing note reviewed.  Constitutional:      Appearance: She is not ill-appearing or toxic-appearing.  HENT:     Head: Normocephalic and atraumatic.     Right Ear: Hearing and external ear normal.     Left Ear: Hearing and external ear normal.     Nose: Nose normal.     Mouth/Throat:     Lips: Pink.  Eyes:     General: Lids are normal. Vision grossly intact. Gaze aligned appropriately.     Extraocular Movements: Extraocular movements intact.     Conjunctiva/sclera: Conjunctivae normal.  Pulmonary:     Effort: Pulmonary effort is normal.  Musculoskeletal:     Cervical back: Neck supple.     Right knee: Normal.     Left knee: Bony tenderness and crepitus (Scant crepitus) present. No swelling, deformity, effusion, erythema, ecchymosis or lacerations. Normal range of motion. Tenderness present over the medial joint line, lateral joint line, MCL, LCL, ACL and patellar tendon. No PCL tenderness. ACL laxity present. Normal alignment, normal meniscus and normal patellar mobility. Normal pulse.     Instability Tests: Anterior drawer test positive.     Comments: 4/5 strength against resistance with flexion and extension at the left knee joint secondary to pain, 5/5 strength to right lower extremity.   Skin:    General: Skin is warm and dry.     Capillary Refill: Capillary refill takes less than 2 seconds.     Findings: No rash.  Neurological:     General: No focal deficit  present.     Mental Status: She is alert and oriented to person, place,  and time. Mental status is at baseline.     Cranial Nerves: No dysarthria or facial asymmetry.  Psychiatric:        Mood and Affect: Mood normal.        Speech: Speech normal.        Behavior: Behavior normal.        Thought Content: Thought content normal.        Judgment: Judgment normal.      UC Treatments / Results  Labs (all labs ordered are listed, but only abnormal results are displayed) Labs Reviewed - No data to display  EKG   Radiology No results found.   Procedures Procedures (including critical care time)  Medications Ordered in UC Medications  ketorolac  (TORADOL ) 30 MG/ML injection 30 mg (30 mg Intramuscular Given 01/19/24 1645)    Initial Impression / Assessment and Plan / UC Course  I have reviewed the triage vital signs and the nursing notes.  Pertinent labs & imaging results that were available during my care of the patient were reviewed by me and considered in my medical decision making (see chart for details).   1. Acute pain of left knee Imaging of the left knee is unremarkable for signs of acute bony abnormality. Positive anterior drawer, there is concern for patellar tendon/ACL injury.  Knee immobilizer placed, crutches ordered, advised RICE.  Ibuprofen  600mg  every 6 hours PRN for pain. Tylenol  1,000mg  every 6 hours PRN for pain.  Follow-up with orthopedics in the next 2-3 days as recommended via walking referral.   Counseled patient on potential for adverse effects with medications prescribed/recommended today, strict ER and return-to-clinic precautions discussed, patient verbalized understanding.    Final Clinical Impressions(s) / UC Diagnoses   Final diagnoses:  Acute pain of left knee     Discharge Instructions      Wear the knee immobilizer and use the crutches until follow-up with orthopedics.  Please schedule follow-up appointment with [facility].  They also  have a walk-in clinic/urgent care that you may go to for evaluation tomorrow or Friday.  We gave you a shot of ketorolac  in the clinic today. Do not take any ibuprofen  for the next 12 to 24 hours since we gave you the shot of ketorolac .  This is a strong anti-inflammatory medication. This will help with your pain.  You may take ibuprofen  600 mg every 6 hours as needed for pain.  Apply ice to the knee to treat pain/swelling.   If you develop any new or worsening symptoms or if your symptoms do not start to improve, please return here or follow-up with your primary care provider. If your symptoms are severe, please go to the emergency room.     ED Prescriptions   None    PDMP not reviewed this encounter.   Enedelia Dorna HERO, [place] 01/28/24 2036

## 2024-01-19 NOTE — Discharge Instructions (Addendum)
 Wear the knee immobilizer and use the crutches until follow-up with orthopedics.  Please schedule follow-up appointment with [facility].  They also have a walk-in clinic/urgent care that you may go to for evaluation tomorrow or Friday.  We gave you a shot of ketorolac  in the clinic today. Do not take any ibuprofen  for the next 12 to 24 hours since we gave you the shot of ketorolac .  This is a strong anti-inflammatory medication. This will help with your pain.  You may take ibuprofen  600 mg every 6 hours as needed for pain.  Apply ice to the knee to treat pain/swelling.   If you develop any new or worsening symptoms or if your symptoms do not start to improve, please return here or follow-up with your primary care provider. If your symptoms are severe, please go to the emergency room.

## 2024-01-19 NOTE — ED Notes (Signed)
 Reviewed work note

## 2024-01-24 ENCOUNTER — Ambulatory Visit: Attending: Nurse Practitioner | Admitting: Nurse Practitioner

## 2024-01-24 DIAGNOSIS — R569 Unspecified convulsions: Secondary | ICD-10-CM

## 2024-01-24 NOTE — Progress Notes (Signed)
 NO SHOW FOR APPT

## 2024-03-20 ENCOUNTER — Encounter: Payer: Self-pay | Admitting: Radiology

## 2024-05-10 ENCOUNTER — Telehealth: Payer: Self-pay | Admitting: Nurse Practitioner

## 2024-05-10 ENCOUNTER — Ambulatory Visit: Admitting: Nurse Practitioner

## 2024-05-10 NOTE — Telephone Encounter (Signed)
 Contacted patient, unable to reach. Voicemail could not be left due to mailbox being full.
